# Patient Record
Sex: Male | Born: 1940 | Race: Black or African American | Hispanic: No | Marital: Single | Smoking: Current every day smoker
Health system: Southern US, Community
[De-identification: ages and names within clinical notes are randomized; demographics above are authoritative.]

## PROBLEM LIST (undated history)

## (undated) DIAGNOSIS — M47815 Spondylosis without myelopathy or radiculopathy, thoracolumbar region: Secondary | ICD-10-CM

## (undated) DIAGNOSIS — F1210 Cannabis abuse, uncomplicated: Secondary | ICD-10-CM

## (undated) DIAGNOSIS — T462X5A Adverse effect of other antidysrhythmic drugs, initial encounter: Secondary | ICD-10-CM

## (undated) DIAGNOSIS — I428 Other cardiomyopathies: Secondary | ICD-10-CM

## (undated) DIAGNOSIS — N400 Enlarged prostate without lower urinary tract symptoms: Secondary | ICD-10-CM

## (undated) DIAGNOSIS — D126 Benign neoplasm of colon, unspecified: Secondary | ICD-10-CM

## (undated) DIAGNOSIS — H269 Unspecified cataract: Secondary | ICD-10-CM

## (undated) DIAGNOSIS — I4891 Unspecified atrial fibrillation: Secondary | ICD-10-CM

## (undated) DIAGNOSIS — Z5189 Encounter for other specified aftercare: Secondary | ICD-10-CM

## (undated) DIAGNOSIS — S41131A Puncture wound without foreign body of right upper arm, initial encounter: Secondary | ICD-10-CM

## (undated) DIAGNOSIS — I10 Essential (primary) hypertension: Secondary | ICD-10-CM

## (undated) DIAGNOSIS — E538 Deficiency of other specified B group vitamins: Secondary | ICD-10-CM

## (undated) DIAGNOSIS — R011 Cardiac murmur, unspecified: Secondary | ICD-10-CM

## (undated) DIAGNOSIS — H18009 Unspecified corneal deposit, unspecified eye: Secondary | ICD-10-CM

## (undated) DIAGNOSIS — Z114 Encounter for screening for human immunodeficiency virus [HIV]: Secondary | ICD-10-CM

## (undated) DIAGNOSIS — Z720 Tobacco use: Secondary | ICD-10-CM

## (undated) DIAGNOSIS — D7389 Other diseases of spleen: Secondary | ICD-10-CM

## (undated) DIAGNOSIS — W3400XA Accidental discharge from unspecified firearms or gun, initial encounter: Secondary | ICD-10-CM

## (undated) DIAGNOSIS — E440 Moderate protein-calorie malnutrition: Secondary | ICD-10-CM

## (undated) DIAGNOSIS — E785 Hyperlipidemia, unspecified: Secondary | ICD-10-CM

## (undated) DIAGNOSIS — E0580 Other thyrotoxicosis without thyrotoxic crisis or storm: Secondary | ICD-10-CM

## (undated) DIAGNOSIS — R918 Other nonspecific abnormal finding of lung field: Secondary | ICD-10-CM

## (undated) DIAGNOSIS — K259 Gastric ulcer, unspecified as acute or chronic, without hemorrhage or perforation: Secondary | ICD-10-CM

## (undated) DIAGNOSIS — I739 Peripheral vascular disease, unspecified: Secondary | ICD-10-CM

## (undated) HISTORY — DX: Gastric ulcer, unspecified as acute or chronic, without hemorrhage or perforation: K25.9

## (undated) HISTORY — DX: Other thyrotoxicosis without thyrotoxic crisis or storm: E05.80

## (undated) HISTORY — DX: Essential (primary) hypertension: I10

## (undated) HISTORY — DX: Hyperlipidemia, unspecified: E78.5

## (undated) HISTORY — DX: Other cardiomyopathies: I42.8

## (undated) HISTORY — PX: CATARACT EXTRACTION: SUR2

## (undated) HISTORY — DX: Unspecified corneal deposit, unspecified eye: H18.009

## (undated) HISTORY — DX: Encounter for other specified aftercare: Z51.89

## (undated) HISTORY — DX: Spondylosis without myelopathy or radiculopathy, thoracolumbar region: M47.815

## (undated) HISTORY — DX: Benign prostatic hyperplasia without lower urinary tract symptoms: N40.0

## (undated) HISTORY — DX: Unspecified atrial fibrillation: I48.91

## (undated) HISTORY — DX: Unspecified cataract: H26.9

## (undated) HISTORY — DX: Deficiency of other specified B group vitamins: E53.8

## (undated) HISTORY — DX: Tobacco use: Z72.0

## (undated) HISTORY — DX: Other nonspecific abnormal finding of lung field: R91.8

## (undated) HISTORY — PX: OTHER SURGICAL HISTORY: SHX169

## (undated) HISTORY — DX: Cardiac murmur, unspecified: R01.1

## (undated) HISTORY — DX: Accidental discharge from unspecified firearms or gun, initial encounter: W34.00XA

## (undated) HISTORY — PX: EYE SURGERY: SHX253

## (undated) HISTORY — DX: Puncture wound without foreign body of right upper arm, initial encounter: S41.131A

## (undated) HISTORY — DX: Moderate protein-calorie malnutrition: E44.0

## (undated) HISTORY — DX: Benign neoplasm of colon, unspecified: D12.6

## (undated) HISTORY — DX: Adverse effect of other antidysrhythmic drugs, initial encounter: T46.2X5A

## (undated) HISTORY — DX: Other diseases of spleen: D73.89

## (undated) HISTORY — DX: Cannabis abuse, uncomplicated: F12.10

## (undated) HISTORY — DX: Peripheral vascular disease, unspecified: I73.9

---

## 1999-03-31 ENCOUNTER — Emergency Department (HOSPITAL_COMMUNITY): Admission: EM | Admit: 1999-03-31 | Discharge: 1999-03-31 | Payer: Self-pay | Admitting: Emergency Medicine

## 1999-03-31 ENCOUNTER — Encounter: Payer: Self-pay | Admitting: Emergency Medicine

## 1999-04-01 ENCOUNTER — Emergency Department (HOSPITAL_COMMUNITY): Admission: EM | Admit: 1999-04-01 | Discharge: 1999-04-01 | Payer: Self-pay | Admitting: Emergency Medicine

## 1999-04-02 ENCOUNTER — Encounter: Payer: Self-pay | Admitting: Emergency Medicine

## 2001-04-11 ENCOUNTER — Emergency Department (HOSPITAL_COMMUNITY): Admission: EM | Admit: 2001-04-11 | Discharge: 2001-04-11 | Payer: Self-pay | Admitting: Emergency Medicine

## 2001-04-11 ENCOUNTER — Encounter: Payer: Self-pay | Admitting: Emergency Medicine

## 2003-01-24 ENCOUNTER — Encounter: Payer: Self-pay | Admitting: General Practice

## 2003-01-24 ENCOUNTER — Encounter: Admission: RE | Admit: 2003-01-24 | Discharge: 2003-01-24 | Payer: Self-pay | Admitting: General Practice

## 2004-11-20 ENCOUNTER — Ambulatory Visit: Payer: Self-pay | Admitting: Family Medicine

## 2006-11-20 DIAGNOSIS — K259 Gastric ulcer, unspecified as acute or chronic, without hemorrhage or perforation: Secondary | ICD-10-CM | POA: Insufficient documentation

## 2006-11-20 HISTORY — DX: Gastric ulcer, unspecified as acute or chronic, without hemorrhage or perforation: K25.9

## 2006-11-25 ENCOUNTER — Ambulatory Visit: Payer: Self-pay | Admitting: Cardiology

## 2006-11-25 ENCOUNTER — Ambulatory Visit: Payer: Self-pay | Admitting: Hospitalist

## 2006-11-25 ENCOUNTER — Encounter (INDEPENDENT_AMBULATORY_CARE_PROVIDER_SITE_OTHER): Payer: Self-pay | Admitting: Internal Medicine

## 2006-11-25 ENCOUNTER — Inpatient Hospital Stay (HOSPITAL_COMMUNITY): Admission: EM | Admit: 2006-11-25 | Discharge: 2006-11-28 | Payer: Self-pay | Admitting: Emergency Medicine

## 2006-11-26 ENCOUNTER — Encounter (INDEPENDENT_AMBULATORY_CARE_PROVIDER_SITE_OTHER): Payer: Self-pay | Admitting: Specialist

## 2006-11-27 ENCOUNTER — Encounter: Payer: Self-pay | Admitting: Internal Medicine

## 2006-11-29 ENCOUNTER — Ambulatory Visit: Payer: Self-pay | Admitting: Internal Medicine

## 2006-11-30 ENCOUNTER — Ambulatory Visit: Payer: Self-pay | Admitting: Internal Medicine

## 2006-12-01 ENCOUNTER — Ambulatory Visit: Payer: Self-pay | Admitting: Internal Medicine

## 2006-12-02 ENCOUNTER — Ambulatory Visit: Payer: Self-pay | Admitting: Internal Medicine

## 2006-12-07 ENCOUNTER — Ambulatory Visit: Payer: Self-pay | Admitting: Internal Medicine

## 2006-12-07 ENCOUNTER — Ambulatory Visit (HOSPITAL_COMMUNITY): Admission: RE | Admit: 2006-12-07 | Discharge: 2006-12-07 | Payer: Self-pay | Admitting: Internal Medicine

## 2006-12-07 ENCOUNTER — Encounter (INDEPENDENT_AMBULATORY_CARE_PROVIDER_SITE_OTHER): Payer: Self-pay | Admitting: Ophthalmology

## 2006-12-07 LAB — CONVERTED CEMR LAB
HCT: 42.2 % (ref 41.0–49.0)
Hemoglobin: 14.3 g/dL (ref 13.9–16.8)
MCHC: 33.8 g/dL (ref 33.1–35.4)
MCV: 101.2 fL — ABNORMAL HIGH (ref 78.8–100.0)
Platelets: 362 10*3/uL (ref 152–374)
RBC: 4.17 M/uL — ABNORMAL LOW (ref 4.20–5.50)
RDW: 15.3 % (ref 11.5–15.3)
WBC: 7.8 10*3/uL (ref 3.7–10.0)

## 2006-12-08 DIAGNOSIS — I471 Supraventricular tachycardia: Secondary | ICD-10-CM | POA: Insufficient documentation

## 2006-12-08 DIAGNOSIS — K250 Acute gastric ulcer with hemorrhage: Secondary | ICD-10-CM | POA: Insufficient documentation

## 2006-12-08 DIAGNOSIS — Z720 Tobacco use: Secondary | ICD-10-CM | POA: Insufficient documentation

## 2006-12-08 DIAGNOSIS — R030 Elevated blood-pressure reading, without diagnosis of hypertension: Secondary | ICD-10-CM | POA: Insufficient documentation

## 2006-12-08 DIAGNOSIS — K279 Peptic ulcer, site unspecified, unspecified as acute or chronic, without hemorrhage or perforation: Secondary | ICD-10-CM | POA: Insufficient documentation

## 2006-12-08 DIAGNOSIS — M47815 Spondylosis without myelopathy or radiculopathy, thoracolumbar region: Secondary | ICD-10-CM

## 2006-12-08 HISTORY — DX: Tobacco use: Z72.0

## 2006-12-08 HISTORY — DX: Spondylosis without myelopathy or radiculopathy, thoracolumbar region: M47.815

## 2006-12-10 ENCOUNTER — Ambulatory Visit: Payer: Self-pay | Admitting: Cardiovascular Disease

## 2006-12-13 ENCOUNTER — Ambulatory Visit: Payer: Self-pay | Admitting: Internal Medicine

## 2006-12-20 ENCOUNTER — Ambulatory Visit: Payer: Self-pay | Admitting: Internal Medicine

## 2006-12-27 ENCOUNTER — Ambulatory Visit: Payer: Self-pay | Admitting: Internal Medicine

## 2006-12-27 ENCOUNTER — Ambulatory Visit: Payer: Self-pay | Admitting: Hospitalist

## 2006-12-30 ENCOUNTER — Ambulatory Visit: Payer: Self-pay

## 2007-01-19 ENCOUNTER — Ambulatory Visit: Payer: Self-pay | Admitting: Hospitalist

## 2007-01-24 ENCOUNTER — Ambulatory Visit: Payer: Self-pay | Admitting: Cardiovascular Disease

## 2007-02-14 ENCOUNTER — Ambulatory Visit: Payer: Self-pay | Admitting: Internal Medicine

## 2007-02-18 ENCOUNTER — Ambulatory Visit: Payer: Self-pay | Admitting: Internal Medicine

## 2007-03-18 ENCOUNTER — Ambulatory Visit: Payer: Self-pay | Admitting: Internal Medicine

## 2007-03-29 ENCOUNTER — Telehealth: Payer: Self-pay | Admitting: *Deleted

## 2007-04-18 ENCOUNTER — Encounter (INDEPENDENT_AMBULATORY_CARE_PROVIDER_SITE_OTHER): Payer: Self-pay | Admitting: *Deleted

## 2007-04-18 ENCOUNTER — Ambulatory Visit: Payer: Self-pay | Admitting: Hospitalist

## 2007-05-18 ENCOUNTER — Ambulatory Visit: Payer: Self-pay | Admitting: Internal Medicine

## 2007-05-25 ENCOUNTER — Ambulatory Visit: Payer: Self-pay | Admitting: Internal Medicine

## 2007-05-25 ENCOUNTER — Encounter (INDEPENDENT_AMBULATORY_CARE_PROVIDER_SITE_OTHER): Payer: Self-pay | Admitting: Internal Medicine

## 2007-05-25 DIAGNOSIS — E538 Deficiency of other specified B group vitamins: Secondary | ICD-10-CM | POA: Insufficient documentation

## 2007-05-25 DIAGNOSIS — I10 Essential (primary) hypertension: Secondary | ICD-10-CM | POA: Insufficient documentation

## 2007-05-25 HISTORY — DX: Deficiency of other specified B group vitamins: E53.8

## 2007-05-25 HISTORY — DX: Essential (primary) hypertension: I10

## 2007-05-25 LAB — CONVERTED CEMR LAB
HCT: 43.6 % (ref 39.0–52.0)
Hemoglobin: 14.5 g/dL (ref 13.0–17.0)
MCHC: 33.3 g/dL (ref 30.0–36.0)
MCV: 98 fL (ref 78.0–100.0)
Platelets: 265 10*3/uL (ref 150–400)
RBC: 4.45 M/uL (ref 4.22–5.81)
RDW: 13.4 % (ref 11.5–14.0)
WBC: 6.6 10*3/uL (ref 4.0–10.5)

## 2007-05-27 ENCOUNTER — Ambulatory Visit (HOSPITAL_COMMUNITY): Admission: RE | Admit: 2007-05-27 | Discharge: 2007-05-27 | Payer: Self-pay | Admitting: Internal Medicine

## 2007-05-31 ENCOUNTER — Telehealth (INDEPENDENT_AMBULATORY_CARE_PROVIDER_SITE_OTHER): Payer: Self-pay | Admitting: Internal Medicine

## 2007-06-09 ENCOUNTER — Ambulatory Visit: Payer: Self-pay | Admitting: Internal Medicine

## 2007-06-09 ENCOUNTER — Encounter (INDEPENDENT_AMBULATORY_CARE_PROVIDER_SITE_OTHER): Payer: Self-pay | Admitting: Internal Medicine

## 2007-06-11 LAB — CONVERTED CEMR LAB
Cholesterol: 164 mg/dL (ref 0–200)
HDL: 39 mg/dL — ABNORMAL LOW (ref >39)
LDL Cholesterol: 108 mg/dL — ABNORMAL HIGH (ref 0–99)
Total CHOL/HDL Ratio: 4.2
Triglycerides: 84 mg/dL (ref <150)
VLDL: 17 mg/dL (ref 0–40)

## 2007-06-21 ENCOUNTER — Ambulatory Visit: Payer: Self-pay | Admitting: Internal Medicine

## 2007-06-21 ENCOUNTER — Encounter (INDEPENDENT_AMBULATORY_CARE_PROVIDER_SITE_OTHER): Payer: Self-pay | Admitting: Internal Medicine

## 2007-06-21 DIAGNOSIS — E785 Hyperlipidemia, unspecified: Secondary | ICD-10-CM | POA: Insufficient documentation

## 2007-06-21 HISTORY — DX: Hyperlipidemia, unspecified: E78.5

## 2007-06-22 LAB — CONVERTED CEMR LAB
ALT: 17 units/L (ref 0–53)
AST: 20 units/L (ref 0–37)
Albumin: 4.6 g/dL (ref 3.5–5.2)
Alkaline Phosphatase: 84 units/L (ref 39–117)
BUN: 11 mg/dL (ref 6–23)
CO2: 27 meq/L (ref 19–32)
Calcium: 10.4 mg/dL (ref 8.4–10.5)
Chloride: 105 meq/L (ref 96–112)
Creatinine, Ser: 0.92 mg/dL (ref 0.40–1.50)
Glucose, Bld: 78 mg/dL (ref 70–99)
Potassium: 4.6 meq/L (ref 3.5–5.3)
Sodium: 140 meq/L (ref 135–145)
Total Bilirubin: 0.4 mg/dL (ref 0.3–1.2)
Total Protein: 7.2 g/dL (ref 6.0–8.3)
Vitamin B-12: 443 pg/mL (ref 211–911)

## 2007-07-12 ENCOUNTER — Ambulatory Visit: Payer: Self-pay | Admitting: Internal Medicine

## 2007-07-12 ENCOUNTER — Encounter (INDEPENDENT_AMBULATORY_CARE_PROVIDER_SITE_OTHER): Payer: Self-pay | Admitting: Internal Medicine

## 2007-07-12 LAB — CONVERTED CEMR LAB
BUN: 3 mg/dL — ABNORMAL LOW (ref 6–23)
CO2: 30 meq/L (ref 19–32)
Calcium: 10 mg/dL (ref 8.4–10.5)
Chloride: 110 meq/L (ref 96–112)
Creatinine, Ser: 1.04 mg/dL (ref 0.40–1.50)
Glucose, Bld: 95 mg/dL (ref 70–99)
HCT: 40.8 % (ref 39.0–52.0)
Hemoglobin: 13.6 g/dL (ref 13.0–17.0)
MCHC: 33.3 g/dL (ref 30.0–36.0)
MCV: 98.3 fL (ref 78.0–100.0)
Platelets: 241 10*3/uL (ref 150–400)
Potassium: 4.5 meq/L (ref 3.5–5.3)
RBC: 4.15 M/uL — ABNORMAL LOW (ref 4.22–5.81)
RDW: 13 % (ref 11.5–14.0)
Sodium: 143 meq/L (ref 135–145)
WBC: 8 10*3/uL (ref 4.0–10.5)

## 2007-08-10 ENCOUNTER — Ambulatory Visit: Payer: Self-pay | Admitting: Internal Medicine

## 2007-08-10 ENCOUNTER — Encounter (INDEPENDENT_AMBULATORY_CARE_PROVIDER_SITE_OTHER): Payer: Self-pay | Admitting: Internal Medicine

## 2007-08-13 LAB — CONVERTED CEMR LAB
BUN: 7 mg/dL (ref 6–23)
CO2: 26 meq/L (ref 19–32)
Calcium: 9.9 mg/dL (ref 8.4–10.5)
Chloride: 109 meq/L (ref 96–112)
Creatinine, Ser: 0.92 mg/dL (ref 0.40–1.50)
Creatinine, Urine: 154.7 mg/dL
Glucose, Bld: 86 mg/dL (ref 70–99)
Microalb Creat Ratio: 2.4 mg/g (ref 0.0–30.0)
Microalb, Ur: 0.37 mg/dL (ref 0.00–1.89)
Potassium: 4.9 meq/L (ref 3.5–5.3)
Sodium: 144 meq/L (ref 135–145)

## 2008-04-09 ENCOUNTER — Telehealth (INDEPENDENT_AMBULATORY_CARE_PROVIDER_SITE_OTHER): Payer: Self-pay | Admitting: Internal Medicine

## 2008-11-07 ENCOUNTER — Telehealth (INDEPENDENT_AMBULATORY_CARE_PROVIDER_SITE_OTHER): Payer: Self-pay | Admitting: Internal Medicine

## 2009-08-13 ENCOUNTER — Ambulatory Visit (HOSPITAL_COMMUNITY): Admission: RE | Admit: 2009-08-13 | Discharge: 2009-08-13 | Payer: Self-pay | Admitting: Internal Medicine

## 2009-08-13 ENCOUNTER — Encounter (INDEPENDENT_AMBULATORY_CARE_PROVIDER_SITE_OTHER): Payer: Self-pay | Admitting: Internal Medicine

## 2009-08-13 ENCOUNTER — Ambulatory Visit: Payer: Self-pay | Admitting: Internal Medicine

## 2009-08-13 LAB — CONVERTED CEMR LAB
BUN: 8 mg/dL (ref 6–23)
CO2: 20 meq/L (ref 19–32)
Calcium: 9.9 mg/dL (ref 8.4–10.5)
Chloride: 108 meq/L (ref 96–112)
Creatinine, Ser: 0.96 mg/dL (ref 0.40–1.50)
Glucose, Bld: 91 mg/dL (ref 70–99)
Potassium: 4.1 meq/L (ref 3.5–5.3)
Sodium: 142 meq/L (ref 135–145)

## 2009-08-21 ENCOUNTER — Ambulatory Visit (HOSPITAL_COMMUNITY): Admission: RE | Admit: 2009-08-21 | Discharge: 2009-08-21 | Payer: Self-pay | Admitting: Internal Medicine

## 2009-08-21 ENCOUNTER — Encounter (INDEPENDENT_AMBULATORY_CARE_PROVIDER_SITE_OTHER): Payer: Self-pay | Admitting: Internal Medicine

## 2009-08-21 ENCOUNTER — Ambulatory Visit: Payer: Self-pay | Admitting: Vascular Surgery

## 2009-09-02 ENCOUNTER — Ambulatory Visit: Payer: Self-pay | Admitting: Infectious Diseases

## 2009-09-02 ENCOUNTER — Encounter (INDEPENDENT_AMBULATORY_CARE_PROVIDER_SITE_OTHER): Payer: Self-pay | Admitting: Internal Medicine

## 2009-09-02 LAB — CONVERTED CEMR LAB
Cholesterol: 134 mg/dL (ref 0–200)
HDL: 45 mg/dL (ref >39)
LDL Cholesterol: 76 mg/dL (ref 0–99)
Total CHOL/HDL Ratio: 3
Triglycerides: 63 mg/dL (ref <150)
VLDL: 13 mg/dL (ref 0–40)

## 2009-09-03 ENCOUNTER — Encounter (INDEPENDENT_AMBULATORY_CARE_PROVIDER_SITE_OTHER): Payer: Self-pay | Admitting: Internal Medicine

## 2009-09-03 ENCOUNTER — Ambulatory Visit: Admission: RE | Admit: 2009-09-03 | Discharge: 2009-09-03 | Payer: Self-pay | Admitting: Internal Medicine

## 2009-09-06 ENCOUNTER — Ambulatory Visit (HOSPITAL_COMMUNITY): Admission: RE | Admit: 2009-09-06 | Discharge: 2009-09-06 | Payer: Self-pay | Admitting: Infectious Diseases

## 2009-09-16 ENCOUNTER — Ambulatory Visit: Payer: Self-pay | Admitting: Infectious Diseases

## 2009-09-18 ENCOUNTER — Encounter (INDEPENDENT_AMBULATORY_CARE_PROVIDER_SITE_OTHER): Payer: Self-pay | Admitting: Internal Medicine

## 2009-09-24 ENCOUNTER — Ambulatory Visit: Payer: Self-pay | Admitting: Internal Medicine

## 2009-09-24 LAB — CONVERTED CEMR LAB
OCCULT 1: NEGATIVE
OCCULT 2: NEGATIVE
OCCULT 3: NEGATIVE

## 2009-09-25 ENCOUNTER — Encounter (INDEPENDENT_AMBULATORY_CARE_PROVIDER_SITE_OTHER): Payer: Self-pay | Admitting: Internal Medicine

## 2009-09-25 ENCOUNTER — Ambulatory Visit: Payer: Self-pay | Admitting: Vascular Surgery

## 2009-10-11 ENCOUNTER — Ambulatory Visit: Payer: Self-pay | Admitting: Internal Medicine

## 2009-10-17 ENCOUNTER — Ambulatory Visit: Payer: Self-pay | Admitting: Internal Medicine

## 2009-10-28 ENCOUNTER — Ambulatory Visit: Payer: Self-pay | Admitting: Internal Medicine

## 2009-10-28 ENCOUNTER — Encounter: Payer: Self-pay | Admitting: Internal Medicine

## 2009-10-28 LAB — HM COLONOSCOPY

## 2009-10-29 ENCOUNTER — Encounter: Payer: Self-pay | Admitting: Internal Medicine

## 2009-11-26 ENCOUNTER — Telehealth (INDEPENDENT_AMBULATORY_CARE_PROVIDER_SITE_OTHER): Payer: Self-pay | Admitting: *Deleted

## 2010-02-11 ENCOUNTER — Telehealth: Payer: Self-pay | Admitting: Internal Medicine

## 2010-05-26 ENCOUNTER — Ambulatory Visit: Payer: Self-pay | Admitting: Cardiovascular Disease

## 2010-05-26 ENCOUNTER — Ambulatory Visit: Payer: Self-pay | Admitting: Infectious Diseases

## 2010-05-26 ENCOUNTER — Inpatient Hospital Stay (HOSPITAL_COMMUNITY): Admission: EM | Admit: 2010-05-26 | Discharge: 2010-06-05 | Payer: Self-pay | Admitting: Emergency Medicine

## 2010-05-26 ENCOUNTER — Encounter: Payer: Self-pay | Admitting: Internal Medicine

## 2010-05-27 ENCOUNTER — Encounter: Payer: Self-pay | Admitting: Infectious Diseases

## 2010-05-29 ENCOUNTER — Ambulatory Visit: Payer: Self-pay | Admitting: Gastroenterology

## 2010-05-30 ENCOUNTER — Encounter: Payer: Self-pay | Admitting: Gastroenterology

## 2010-05-30 ENCOUNTER — Encounter: Payer: Self-pay | Admitting: Infectious Diseases

## 2010-05-30 ENCOUNTER — Encounter: Payer: Self-pay | Admitting: Internal Medicine

## 2010-06-03 ENCOUNTER — Encounter: Payer: Self-pay | Admitting: Internal Medicine

## 2010-06-05 ENCOUNTER — Encounter: Payer: Self-pay | Admitting: Internal Medicine

## 2010-06-05 DIAGNOSIS — I4891 Unspecified atrial fibrillation: Secondary | ICD-10-CM

## 2010-06-05 DIAGNOSIS — K2970 Gastritis, unspecified, without bleeding: Secondary | ICD-10-CM | POA: Insufficient documentation

## 2010-06-05 DIAGNOSIS — D7389 Other diseases of spleen: Secondary | ICD-10-CM | POA: Insufficient documentation

## 2010-06-05 DIAGNOSIS — K2990 Gastroduodenitis, unspecified, without bleeding: Secondary | ICD-10-CM

## 2010-06-05 HISTORY — DX: Unspecified atrial fibrillation: I48.91

## 2010-06-06 ENCOUNTER — Encounter: Payer: Self-pay | Admitting: Gastroenterology

## 2010-06-09 ENCOUNTER — Ambulatory Visit: Payer: Self-pay | Admitting: Cardiology

## 2010-06-09 ENCOUNTER — Telehealth (INDEPENDENT_AMBULATORY_CARE_PROVIDER_SITE_OTHER): Payer: Self-pay | Admitting: Pharmacist

## 2010-06-09 LAB — CONVERTED CEMR LAB: POC INR: 4

## 2010-06-16 ENCOUNTER — Ambulatory Visit: Payer: Self-pay | Admitting: Cardiology

## 2010-06-16 LAB — CONVERTED CEMR LAB
ALT: 100 units/L — ABNORMAL HIGH (ref 0–53)
AST: 49 units/L — ABNORMAL HIGH (ref 0–37)
Albumin: 4 g/dL (ref 3.5–5.2)
Alkaline Phosphatase: 103 units/L (ref 39–117)
BUN: 16 mg/dL (ref 6–23)
Bilirubin, Direct: 0.2 mg/dL (ref 0.0–0.3)
CO2: 28 meq/L (ref 19–32)
Calcium: 10.1 mg/dL (ref 8.4–10.5)
Chloride: 103 meq/L (ref 96–112)
Creatinine, Ser: 1 mg/dL (ref 0.4–1.5)
GFR calc non Af Amer: 94.23 mL/min (ref >60)
Glucose, Bld: 68 mg/dL — ABNORMAL LOW (ref 70–99)
Potassium: 4.3 meq/L (ref 3.5–5.1)
Sodium: 138 meq/L (ref 135–145)
Total Bilirubin: 0.6 mg/dL (ref 0.3–1.2)
Total Protein: 7.1 g/dL (ref 6.0–8.3)

## 2010-06-20 DIAGNOSIS — I5040 Unspecified combined systolic (congestive) and diastolic (congestive) heart failure: Secondary | ICD-10-CM | POA: Insufficient documentation

## 2010-06-20 DIAGNOSIS — A0472 Enterocolitis due to Clostridium difficile, not specified as recurrent: Secondary | ICD-10-CM | POA: Insufficient documentation

## 2010-06-24 ENCOUNTER — Ambulatory Visit: Payer: Self-pay | Admitting: Cardiology

## 2010-06-25 ENCOUNTER — Ambulatory Visit: Payer: Self-pay | Admitting: Internal Medicine

## 2010-06-25 LAB — CONVERTED CEMR LAB

## 2010-07-03 LAB — CONVERTED CEMR LAB
Basophils Absolute: 0.1 10*3/uL (ref 0.0–0.1)
Basophils Relative: 0.8 % (ref 0.0–3.0)
Eosinophils Absolute: 0.1 10*3/uL (ref 0.0–0.7)
Eosinophils Relative: 1.3 % (ref 0.0–5.0)
HCT: 46.2 % (ref 39.0–52.0)
Hemoglobin: 15.7 g/dL (ref 13.0–17.0)
Lymphocytes Relative: 21.8 % (ref 12.0–46.0)
Lymphs Abs: 1.4 10*3/uL (ref 0.7–4.0)
MCHC: 33.9 g/dL (ref 30.0–36.0)
MCV: 101.8 fL — ABNORMAL HIGH (ref 78.0–100.0)
Monocytes Absolute: 0.4 10*3/uL (ref 0.1–1.0)
Monocytes Relative: 6.8 % (ref 3.0–12.0)
Neutro Abs: 4.6 10*3/uL (ref 1.4–7.7)
Neutrophils Relative %: 69.3 % (ref 43.0–77.0)
Platelets: 325 10*3/uL (ref 150.0–400.0)
RBC: 4.54 M/uL (ref 4.22–5.81)
RDW: 13.7 % (ref 11.5–14.6)
WBC: 6.6 10*3/uL (ref 4.5–10.5)

## 2010-07-09 ENCOUNTER — Telehealth: Payer: Self-pay | Admitting: Cardiology

## 2010-07-10 ENCOUNTER — Ambulatory Visit: Payer: Self-pay

## 2010-07-10 ENCOUNTER — Ambulatory Visit: Payer: Self-pay | Admitting: Cardiology

## 2010-07-16 ENCOUNTER — Ambulatory Visit: Payer: Self-pay | Admitting: Cardiology

## 2010-07-17 ENCOUNTER — Ambulatory Visit: Payer: Self-pay | Admitting: Cardiovascular Disease

## 2010-07-17 ENCOUNTER — Ambulatory Visit: Payer: Self-pay | Admitting: Cardiology

## 2010-07-17 ENCOUNTER — Encounter: Payer: Self-pay | Admitting: Ophthalmology

## 2010-07-17 ENCOUNTER — Inpatient Hospital Stay (HOSPITAL_COMMUNITY): Admission: EM | Admit: 2010-07-17 | Discharge: 2010-07-19 | Payer: Self-pay | Admitting: Emergency Medicine

## 2010-07-17 ENCOUNTER — Ambulatory Visit: Payer: Self-pay | Admitting: Internal Medicine

## 2010-07-18 ENCOUNTER — Encounter: Payer: Self-pay | Admitting: Internal Medicine

## 2010-07-19 ENCOUNTER — Encounter: Payer: Self-pay | Admitting: Internal Medicine

## 2010-07-19 DIAGNOSIS — E871 Hypo-osmolality and hyponatremia: Secondary | ICD-10-CM | POA: Insufficient documentation

## 2010-08-04 ENCOUNTER — Ambulatory Visit: Payer: Self-pay | Admitting: Cardiology

## 2010-08-14 ENCOUNTER — Ambulatory Visit: Payer: Self-pay | Admitting: Internal Medicine

## 2010-08-14 ENCOUNTER — Telehealth: Payer: Self-pay | Admitting: Cardiology

## 2010-08-14 DIAGNOSIS — R933 Abnormal findings on diagnostic imaging of other parts of digestive tract: Secondary | ICD-10-CM | POA: Insufficient documentation

## 2010-08-19 ENCOUNTER — Encounter (INDEPENDENT_AMBULATORY_CARE_PROVIDER_SITE_OTHER): Payer: Self-pay | Admitting: *Deleted

## 2010-08-29 ENCOUNTER — Emergency Department (HOSPITAL_COMMUNITY): Admission: EM | Admit: 2010-08-29 | Discharge: 2010-08-29 | Payer: Self-pay | Admitting: Emergency Medicine

## 2010-09-04 ENCOUNTER — Ambulatory Visit: Payer: Self-pay

## 2010-09-04 ENCOUNTER — Encounter: Payer: Self-pay | Admitting: Cardiology

## 2010-10-17 ENCOUNTER — Encounter (INDEPENDENT_AMBULATORY_CARE_PROVIDER_SITE_OTHER): Payer: Self-pay | Admitting: *Deleted

## 2010-10-17 ENCOUNTER — Encounter: Payer: Self-pay | Admitting: Cardiology

## 2010-10-17 ENCOUNTER — Ambulatory Visit: Payer: Self-pay | Admitting: Gastroenterology

## 2010-10-30 ENCOUNTER — Ambulatory Visit: Payer: Self-pay | Admitting: Gastroenterology

## 2010-10-30 ENCOUNTER — Ambulatory Visit (HOSPITAL_COMMUNITY): Admission: RE | Admit: 2010-10-30 | Discharge: 2010-10-30 | Payer: Self-pay | Admitting: Gastroenterology

## 2010-11-03 ENCOUNTER — Ambulatory Visit: Payer: Self-pay | Admitting: Gastroenterology

## 2010-11-03 LAB — CONVERTED CEMR LAB
BUN: 9 mg/dL (ref 6–23)
Creatinine, Ser: 1.2 mg/dL (ref 0.4–1.5)

## 2010-11-04 ENCOUNTER — Telehealth: Payer: Self-pay | Admitting: Gastroenterology

## 2010-11-05 ENCOUNTER — Ambulatory Visit: Payer: Self-pay | Admitting: Cardiology

## 2010-11-06 ENCOUNTER — Encounter: Payer: Self-pay | Admitting: Internal Medicine

## 2010-11-06 DIAGNOSIS — R911 Solitary pulmonary nodule: Secondary | ICD-10-CM | POA: Insufficient documentation

## 2010-11-06 DIAGNOSIS — R918 Other nonspecific abnormal finding of lung field: Secondary | ICD-10-CM

## 2010-11-10 ENCOUNTER — Encounter (INDEPENDENT_AMBULATORY_CARE_PROVIDER_SITE_OTHER): Payer: Self-pay | Admitting: *Deleted

## 2010-11-10 ENCOUNTER — Telehealth: Payer: Self-pay | Admitting: Cardiology

## 2010-11-10 ENCOUNTER — Telehealth: Payer: Self-pay | Admitting: Gastroenterology

## 2010-12-05 ENCOUNTER — Ambulatory Visit: Payer: Self-pay | Admitting: Cardiology

## 2010-12-05 ENCOUNTER — Encounter: Payer: Self-pay | Admitting: Cardiology

## 2010-12-17 ENCOUNTER — Ambulatory Visit (HOSPITAL_COMMUNITY)
Admission: RE | Admit: 2010-12-17 | Discharge: 2010-12-17 | Payer: Self-pay | Source: Home / Self Care | Attending: Internal Medicine | Admitting: Internal Medicine

## 2010-12-17 ENCOUNTER — Ambulatory Visit: Payer: Self-pay | Admitting: Cardiology

## 2011-01-08 ENCOUNTER — Ambulatory Visit: Admission: RE | Admit: 2011-01-08 | Discharge: 2011-01-08 | Payer: Self-pay | Source: Home / Self Care

## 2011-01-08 LAB — CONVERTED CEMR LAB
ALT: 21 units/L (ref 0–53)
AST: 25 units/L (ref 0–37)
Albumin: 3.9 g/dL (ref 3.5–5.2)
Alkaline Phosphatase: 67 units/L (ref 39–117)
BUN: 5 mg/dL — ABNORMAL LOW (ref 6–23)
Basophils Absolute: 0 10*3/uL (ref 0.0–0.1)
Basophils Relative: 0 % (ref 0–1)
CO2: 28 meq/L (ref 19–32)
Calcium: 10 mg/dL (ref 8.4–10.5)
Chloride: 104 meq/L (ref 96–112)
Creatinine, Ser: 1.18 mg/dL (ref 0.40–1.50)
Eosinophils Absolute: 0.1 10*3/uL (ref 0.0–0.7)
Eosinophils Relative: 1 % (ref 0–5)
Glucose, Bld: 98 mg/dL (ref 70–99)
HCT: 43.7 % (ref 39.0–52.0)
Hemoglobin: 14.1 g/dL (ref 13.0–17.0)
Lymphocytes Relative: 23 % (ref 12–46)
Lymphs Abs: 1.8 10*3/uL (ref 0.7–4.0)
MCHC: 32.3 g/dL (ref 30.0–36.0)
MCV: 100.5 fL — ABNORMAL HIGH (ref 78.0–100.0)
Monocytes Absolute: 0.7 10*3/uL (ref 0.1–1.0)
Monocytes Relative: 9 % (ref 3–12)
Neutro Abs: 5.5 10*3/uL (ref 1.7–7.7)
Neutrophils Relative %: 67 % (ref 43–77)
Platelets: 209 10*3/uL (ref 150–400)
Potassium: 4 meq/L (ref 3.5–5.3)
RBC: 4.35 M/uL (ref 4.22–5.81)
RDW: 12.8 % (ref 11.5–15.5)
Sodium: 141 meq/L (ref 135–145)
TSH: 1.181 microintl units/mL (ref 0.350–4.50)
Total Bilirubin: 0.4 mg/dL (ref 0.3–1.2)
Total Protein: 6.5 g/dL (ref 6.0–8.3)
WBC: 8.1 10*3/uL (ref 4.0–10.5)

## 2011-01-11 ENCOUNTER — Encounter: Payer: Self-pay | Admitting: Cardiology

## 2011-01-14 ENCOUNTER — Encounter: Payer: Self-pay | Admitting: Cardiovascular Disease

## 2011-01-18 LAB — CONVERTED CEMR LAB
ALT: 55 U/L — ABNORMAL HIGH
AST: 35 U/L
Albumin: 3.6 g/dL
Alkaline Phosphatase: 87 U/L
BUN: 21 mg/dL
BUN: 26 mg/dL — ABNORMAL HIGH
BUN: 9 mg/dL
Bilirubin, Direct: 0 mg/dL
CO2: 25 meq/L
CO2: 28 meq/L
CO2: 28 meq/L
Calcium: 10.4 mg/dL
Calcium: 9.3 mg/dL
Calcium: 9.8 mg/dL
Chloride: 82 meq/L — ABNORMAL LOW
Chloride: 97 meq/L
Chloride: 99 meq/L
Cholesterol: 151 mg/dL
Creatinine, Ser: 1 mg/dL
Creatinine, Ser: 1.4 mg/dL
Creatinine, Ser: 1.5 mg/dL
GFR calc non Af Amer: 101.08 mL/min
GFR calc non Af Amer: 61.57 mL/min
GFR calc non Af Amer: 62.56 mL/min
Glucose, Bld: 68 mg/dL — ABNORMAL LOW
Glucose, Bld: 91 mg/dL
Glucose, Bld: 97 mg/dL
HDL: 62.3 mg/dL
LDL Cholesterol: 73 mg/dL
Potassium: 4.5 meq/L
Potassium: 4.6 meq/L
Potassium: 5.8 meq/L — ABNORMAL HIGH
Sed Rate: 36 mm/h — ABNORMAL HIGH
Sodium: 119 meq/L — CL
Sodium: 130 meq/L — ABNORMAL LOW
Sodium: 137 meq/L
TSH: 0.7 u[IU]/mL
TSH: 2.39 u[IU]/mL
Total Bilirubin: 0.2 mg/dL — ABNORMAL LOW
Total CHOL/HDL Ratio: 2
Total Protein: 6.5 g/dL
Triglycerides: 77 mg/dL
VLDL: 15.4 mg/dL

## 2011-01-22 NOTE — Miscellaneous (Signed)
Summary: Problem List Update: Pulmonary Nodule  Clinical Lists Changes  Problems: Added new problem of PULMONARY NODULE, LEFT LOWER LOBE (ICD-518.89) - 7mm LLL nodule discovered on CT A/P on 11/05/10. Assessed PULMONARY NODULE, LEFT LOWER LOBE as comment only -  A 7mm pulmonary nodule was discovered on CT w/ contrast of the abdomen obtained for further evaluation of a splenic cyst observed on abdominal ultrasound.  Pt has known tobacco use, wiill ensure pt receives f/u imaging with CT chest  in 3-6 months.  Future Orders: CT with Contrast (CT w/ contrast) ... 01/05/2011  Orders: Added new Test order of CT with Contrast (CT w/ contrast) - Signed      Impression & Recommendations:  Problem # 1:  PULMONARY NODULE, LEFT LOWER LOBE (ICD-518.89)  A 7mm pulmonary nodule was discovered on CT w/ contrast of the abdomen obtained for further evaluation of a splenic cyst observed on abdominal ultrasound.  Pt has known tobacco use, wiill ensure pt receives f/u imaging with CT chest  in 3-6 months.  Future Orders: CT with Contrast (CT w/ contrast) ... 01/05/2011  Complete Medication List: 1)  Simvastatin 20 Mg Tabs (Simvastatin) .... Take 1 tablet by mouth once a day 2)  Pantoprazole Sodium 40 Mg Tbec (Pantoprazole sodium) .... Please take 40mg  (1 tab) by mouth twice daily. 3)  Amiodarone Hcl 200 Mg Tabs (Amiodarone hcl) .... One daily 4)  Metoprolol Succinate 50 Mg Xr24h-tab (Metoprolol succinate) .... Take one and one-half tablets (75mg ) by mouth once a day. 5)  Pradaxa 150 Mg Caps (Dabigatran etexilate mesylate) .... Take 1 capsule two times a day 6)  Enalapril Maleate 10 Mg Tabs (Enalapril maleate) .... One half tablet twice a day

## 2011-01-22 NOTE — Assessment & Plan Note (Signed)
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 BROUGHT MED FROM HOME    Patient tolerated injection without complications    Given by: Geannie Risen RN (January 19, 2007 11:06 AM)  Orders Added: 1)  Admin of Therapeutic Inj  intramuscular or subcutaneous [90772]

## 2011-01-22 NOTE — Letter (Signed)
Summary: Results Letter  [facility]  [address] [place], [place] [address]   Phone: [phone]  Fax: [phone]        June 06, 2010 MRN: [id number]    Daniel [name] [address] [place], [place]  [address]    Dear [name],   The biopsies taken during your recent EGD and flexible sigmoidoscopy showed no clear sign of infection and certainly no signs of cancer.  You should continue to follow the recommnedations that we discussed at the time of your procedure.  I believe you are scheduled for follow up appt with Dr. Leone Payor in 3-4 weeks.  Please feel free to call if you have any further questions or concerns.       Sincerely,  Rachael Fee MD  This letter has been electronically signed by your physician.  Appended Document: Results Letter letter mailed
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
## 2011-01-22 NOTE — Progress Notes (Signed)
Summary: refill/gg  Phone Note Refill Request  on February 11, 2010 2:17 PM  Refills Requested: Medication #1:  SIMVASTATIN 20 MG  TABS Take 1 tablet by mouth once a day   Dosage confirmed as above?Dosage Confirmed   Supply Requested: 1 year   Last Refilled: 09/02/2009 last lipids   09/02/2009      # 90 requested   Method Requested: Electronic Initial call taken by: Merrie Roof RN,  February 11, 2010 2:17 PM  Follow-up for Phone Call        Please schedule Mr. Mcsweeney with Dr. Tobie Lords at the next available non-overbook appointment within three months.  Thanks. Follow-up by: Doneen Poisson MD,  February 11, 2010 2:26 PM  Additional Follow-up for Phone Call Additional follow up Details #1::        flag sent to [place] for appointment to be scheduled Additional Follow-up by: Merrie Roof RN,  February 12, 2010 3:17 PM    Prescriptions: SIMVASTATIN 20 MG  TABS (SIMVASTATIN) Take 1 tablet by mouth once a day  #90 x 3   Entered and Authorized by:   Doneen Poisson MD   Signed by:   Doneen Poisson MD on 02/11/2010   Method used:   Electronically to        [facility] [phone]* (retail)       [address]       [place], [place]  [address]       Ph: [phone]       Fax: [phone]   RxID:   [id number]

## 2011-01-22 NOTE — Assessment & Plan Note (Signed)
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 Call to make an appt before then if you have any problems.
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
## 2011-01-22 NOTE — Progress Notes (Signed)
Summary: Test Tomorrow  Phone Note Call from Patient Call back at Home Phone [phone]   Caller: Patient Call For: Dr. Christella Hartigan Reason for Call: Talk to Nurse Details for Reason: Test Tomorrow Summary of Call: Pt. says he has a test tomorrow and wanted to know if he was supposed to do anything to prepare for it. Please call and advise. Initial call taken by: Schuyler Amor,  November 04, 2010 11:08 AM  Follow-up for Phone Call        pt advised he was to come here and pick up his contrast the day of his labs but he was confused,  he will come by today and pick up contrast and instructions Follow-up by: Chales Abrahams CMA Duncan Dull),  November 04, 2010 11:20 AM

## 2011-01-22 NOTE — Assessment & Plan Note (Signed)
Summary: FU/SB.   Vital Signs:  Patient profile:   70 year old male Height:      70 inches (177.80 cm) Weight:      117.3 pounds (53.32 kg) BMI:     16.89 Temp:     95.9 degrees F (35.50 degrees C) oral Pulse rate:   57 / minute BP sitting:   140 / 87  (right arm)  Vitals Entered By: Chinita Pester RN (September 02, 2009 9:03 AM) CC: F/U visit - no further c/o leg pain.  States he has abd. gas/he stopped taking Prevacid. Is Patient Diabetic? No Pain Assessment Patient in pain? no      Nutritional Status BMI of < 19 = underweight  Have you ever been in a relationship where you felt threatened, hurt or afraid?No   Does patient need assistance? Functional Status Self care Ambulation Normal   Primary Care Tequita Marrs:  Silvestre Gunner MD  CC:  F/U visit - no further c/o leg pain.  States he has abd. gas/he stopped taking Prevacid.Marland Kitchen  History of Present Illness: Mr. Mogan is a 70 yo M with PVD confirmed by ABI who presents for f/u of his claudication for which he was last seen 2 weeks ago by Dr. Polly Cobia. He was prescribed aspirin at that time but has not been taking it because he thought it was for pain. He has been otherwise well with no claudication episodes in the interim.  He also has stopped taking his Prevacid 3-4 mos ago because he saw on the news that it could potentially be harmful. He has a h/o bleeding ulcer (12/07) and wanted to mention that when he doesn't eat, his stomach rumbles. Denies pain, dizziness, feeling faint, CP, SOB, n/v, or changes in stool color or consistency. He'd like to know if he should go back on the Prevacid.  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 GI:  Denies bloody stools, dark tarry stools, nausea, and vomiting.  Physical Exam  General:  alert, well-developed, and underweight appearing.   Head:  normocephalic and atraumatic.   Lungs:  normal respiratory effort, no intercostal retractions, no accessory muscle use, no crackles, and no wheezes.   Heart:  normal rate, no murmur, no gallop, no rub, and irregular rhythm.   Abdomen:  soft, non-tender, and no distention.   Pulses:  R dorsalis pedis decreased and L dorsalis pedis decreased.   Extremities:  Absent leg hair bilat upper-shin and below. No ulcers or wounds on feet Neurologic:  alert & oriented X3.     Impression & Recommendations:  Problem # 1:  INTERMITTENT CLAUDICATION, RIGHT LEG (ICD-443.9) Pt will see Vascular on Oct 16. Dr. Sampson Goon counseled the patient on the importance of taking the aspirin as prescribed and explained that it's not for pain but rather to thin the blood and help with blood flow to his legs. Mr. Jocson agreed to take aspirin daily.  Problem # 2:  IRREGULAR HEART RATE (ICD-427.9)  Mr. Robarts was noted to have an irregular heartbeat on exam, no tachycardia. He is asymptomatic (no palpitations, CP, or SOB), though he does describe some DOE which has been going on for a while. EKG today showed atrial flutter with poor R wave progression. Will order a 24 hr holter monitor and prescribe full-dose aspirin, which according to up-to-date is a sufficient anticoagulant in this patient with only 1 risk factor on the CHADS-2 score. Will have him f/u with me in a couple weeks.  His updated medication list for this problem includes:    Metoprolol Tartrate 50 Mg Tabs (Metoprolol tartrate) .Marland Kitchen... Take one tablet by mouth twice a day    Aspirin 325 Mg Tabs (Aspirin)  .Marland Kitchen... 1 tab daily  Orders: 12 Lead EKG (12 Lead EKG) 24 Hr Holter (24 Hr Holter)  Problem # 3:  TOBACCO ABUSE (ICD-305.1) Dr. Sampson Goon and I counseled Mr. Chohan on the importance of smoking cessation, especially given his PVD and other issues. Mr. Mulvehill said he would think over how he we can help him stop smoking, and I will see him back in clinic in a couple weeks to finalize a cessation plan. In the interim, I promised him that I would do some research on the method of cessation that works the best for the most people, as he would only like to try one thing.  Complete Medication List: 1)  Metoprolol Tartrate 50 Mg Tabs (Metoprolol tartrate) .... Take one tablet by mouth twice a day 2)  Prilosec Otc 20 Mg Tbec (Omeprazole magnesium) .... Take 1 tablet by mouth once a day 3)  Hydrochlorothiazide 25 Mg Tabs (Hydrochlorothiazide) .... Take 1 tablet by mouth once a day 4)  Simvastatin 20 Mg Tabs (Simvastatin) .... Take 1 tablet by mouth once a day 5)  Aspirin 325 Mg Tabs (Aspirin) .Marland Kitchen.. 1 tab daily  Other Orders: T-Lipid Profile ([id number])  Patient Instructions: 1)  Please schedule a follow-up appointment with Dr. Tobie Lords in 2 weeks. Prior to your next appointment, think of how we can assist you in stopping smoking, as this is very important for your health. 2)  Please wear the heart monitor as directed for 24 consecutive hours.  3)  Restart your Prevacid and start taking 325 mg of Aspirin daily to help with blood flow to your legs and to help prevent complications from your irregular heartbeat.   Prevention & Chronic Care Immunizations   Influenza vaccine: Not documented    Tetanus booster: Not documented    Pneumococcal vaccine: Not documented    H. zoster vaccine: Not documented  Colorectal Screening   Hemoccult: Not documented    Colonoscopy: Not documented  Other Screening   PSA: Not documented   Smoking status: current  (09/02/2009)   Smoking cessation counseling:  yes  (09/02/2009)  Lipids   Total Cholesterol: 164  (06/09/2007)   LDL: 108  (06/09/2007)   LDL Direct: Not documented   HDL: 39  (06/09/2007)   Triglycerides: 84  (06/09/2007)    SGOT (AST): 20  (06/21/2007)   SGPT (ALT): 17  (06/21/2007)   Alkaline phosphatase: 84  (06/21/2007)   Total bilirubin: 0.4  (06/21/2007)  Hypertension   Last Blood Pressure: 140 / 87  (09/02/2009)   Serum creatinine: 0.96  (08/13/2009)   Serum potassium 4.1  (08/13/2009)  Self-Management Support :    Hypertension self-management support: Not documented    Lipid self-management support: Not documented   Process Orders Check Orders Results:     [facility]: Check successful Tests Sent for requisitioning (September 02, 2009 2:59 PM):     09/02/2009: [facility] -- T-Lipid Profile [phone] (signed)

## 2011-01-22 NOTE — Letter (Signed)
Summary: Handout Printed  Printed Handout:  - *Patient Instructions 
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
## 2011-01-22 NOTE — Consult Note (Signed)
Summary: [facility]  [facility]   Imported By: Florinda Marker 10/14/2009 14:44:20  _____________________________________________________________________  External Attachment:    Type:   Image     Comment:   External Document  Appended Document: [facility]   Impression & Recommendations:  Problem # 1:  INTERMITTENT CLAUDICATION, RIGHT LEG (ICD-443.9) Assessment: Comment Only seen by [facility] in Oct 2010, moderate claudication, repeat ABI in six months, continue with risk factor reduction.   Complete Medication List: 1)  Metoprolol Tartrate 50 Mg Tabs (Metoprolol tartrate) .... Take one tablet by mouth twice a day 2)  Prilosec Otc 20 Mg Tbec (Omeprazole magnesium) .... Take 1 tablet by mouth once a day 3)  Hydrochlorothiazide 25 Mg Tabs (Hydrochlorothiazide) .... Take 1 tablet by mouth once a day 4)  Simvastatin 20 Mg Tabs (Simvastatin) .... Take 1 tablet by mouth once a day 5)  Aspirin 325 Mg Tabs (Aspirin) .Marland Kitchen.. 1 tab daily 6)  Chantix 0.5 Mg Tabs (Varenicline tartrate) .... Take 1 tablet daily for 3 days, then 1 tablet twice daily for 4 days, then 2 tablets twice daily indefinitely. 7)  Dulcolax 5 Mg Tbec (Bisacodyl) .... Day before procedure take 2 at 3pm and 2 at 8pm. 8)  Metoclopramide Hcl 10 Mg Tabs (Metoclopramide hcl) .... As per prep instructions. 9)  Miralax Powd (Polyethylene glycol 3350) .... As per prep  instructions.

## 2011-01-22 NOTE — Procedures (Signed)
Summary: Endo Prep  Endo Prep   Imported By: Lester [place] 10/21/2010 09:06:42  _____________________________________________________________________  External Attachment:    Type:   Image     Comment:   External Document

## 2011-01-22 NOTE — Progress Notes (Signed)
Summary: Refill/gh  Phone Note Refill Request Message from:  Fax from Pharmacy on November 26, 2009 9:44 AM  Refills Requested: Medication #1:  METOPROLOL TARTRATE 50 MG TABS Take one tablet by mouth twice a day   Last Refilled: 05/24/2009  Method Requested: Electronic Initial call taken by: Angelina Ok RN,  November 26, 2009 9:45 AM    Prescriptions: METOPROLOL TARTRATE 50 MG TABS (METOPROLOL TARTRATE) Take one tablet by mouth twice a day  #186 x 3   Entered and Authorized by:   Zoila Shutter MD   Signed by:   Zoila Shutter MD on 11/26/2009   Method used:   Electronically to        [facility] [phone] (retail)       [address]       [place], [place]  [address]       Ph: [phone]       Fax: [phone]   RxID:   [id number]
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
## 2011-01-22 NOTE — Letter (Signed)
Summary: Anticoagulation Modification Letter  [facility]  [address] [place], [place] [address]   Phone: [phone]  Fax: [phone]    October 17, 2010  Re:    Daniel [name] DOB:    03/04/1941 MRN:    [id number]    Dear Dr Shirlee Latch,  We have scheduled the above patient for an endoscopic procedure. Our records show that  he/she is on anticoagulation therapy. Please advise as to how long the patient may come off their therapy of Pradaxa prior to the scheduled procedure(s) on 10/30/10.   Please fax back/or route the completed form to Patty at [phone].  Thank you for your help with this matter.  Sincerely,  Chales Abrahams CMA Duncan Dull)   Physician Recommendation:  Hold Plavix 7 days prior ________________  Hold Coumadin 5 days prior ____________  Other ______________________________     Appended Document: Anticoagulation Modification Letter OK to hold Pradaxa prior to procedure.  DCCV was several months ago.  Holding Pradaxa 2 days prior will give proper washout.   Appended Document: Anticoagulation Modification Letter patty, please have him hold pradaxa for 2 days prior to the procedure  Appended Document: Anticoagulation Modification Letter no answer on home phone   Appended Document: Anticoagulation Modification Letter pt aware
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
## 2011-01-22 NOTE — Miscellaneous (Signed)
Summary: [facility] Previsit/prep  Clinical Lists Changes  Medications: Added new medication of DULCOLAX 5 MG  TBEC (BISACODYL) Day before procedure take 2 at 3pm and 2 at 8pm. - Signed Added new medication of METOCLOPRAMIDE HCL 10 MG  TABS (METOCLOPRAMIDE HCL) As per prep instructions. - Signed Added new medication of MIRALAX   POWD (POLYETHYLENE GLYCOL 3350) As per prep  instructions. - Signed Rx of DULCOLAX 5 MG  TBEC (BISACODYL) Day before procedure take 2 at 3pm and 2 at 8pm.;  #4 x 0;  Signed;  Entered by: Wyona Almas RN;  Authorized by: Iva Boop MD, [facility];  Method used: Electronically to [facility] [id number]*, [address], [place], [place]  [address], Ph: [phone], Fax: [phone] Rx of METOCLOPRAMIDE HCL 10 MG  TABS (METOCLOPRAMIDE HCL) As per prep instructions.;  #2 x 0;  Signed;  Entered by: Wyona Almas RN;  Authorized by: Iva Boop MD, [facility];  Method used: Electronically to [facility] [phone]*, [address], [place], [place]  [address], Ph: [phone], Fax: [phone] Rx of MIRALAX   POWD (POLYETHYLENE GLYCOL 3350) As per prep  instructions.;  #255gm x 0;  Signed;  Entered by: Wyona Almas RN;  Authorized by: Iva Boop MD, [facility];  Method used: Electronically to [facility] [phone]*, [address] [place], [place]  [address], Ph: [phone], Fax: [phone] Observations: Added new observation of NKA: T (10/11/2009 9:50)    Prescriptions: MIRALAX   POWD (POLYETHYLENE GLYCOL 3350) As per prep  instructions.  #255gm x 0   Entered by:   Wyona Almas RN   Authorized by:   Iva Boop MD, [facility]   Signed by:   Wyona Almas RN on 10/11/2009   Method used:   Electronically to        [facility] [phone]* (retail)       [address]       [place], [place]  [address]       Ph: [phone]       Fax: [phone]   RxID:   [id number] METOCLOPRAMIDE HCL 10 MG  TABS (METOCLOPRAMIDE HCL) As per prep instructions.  #2 x 0   Entered  by:   Wyona Almas RN   Authorized by:   Iva Boop MD, [facility]   Signed by:   Wyona Almas RN on 10/11/2009   Method used:   Electronically to        [facility] [phone]* (retail)       [address]       [place], [place]  [address]       Ph: [phone]       Fax: [phone]   RxID:   [id number] DULCOLAX 5 MG  TBEC (BISACODYL) Day before procedure take 2 at 3pm and 2 at 8pm.  #4 x 0   Entered by:   Wyona Almas RN   Authorized by:   Iva Boop MD, [facility]   Signed by:   Wyona Almas RN on 10/11/2009   Method used:   Electronically to        [facility] [phone]* (retail)       [address]       [place], [place]  [address]       Ph: [phone]       Fax: [phone]   RxID:   [id number]

## 2011-01-22 NOTE — Progress Notes (Signed)
Summary: scan results  Phone Note Call from Patient Call back at [phone]   Caller: Patient Reason for Call: Talk to Nurse, Lab or Test Results Summary of Call: scan results. pt would like to talk with a nurse. Initial call taken by: Roe Coombs,  November 10, 2010 12:29 PM  Follow-up for Phone Call        pt calling about abd CT scan --referred pt to Dr Christella Hartigan for further questions /instructions

## 2011-01-22 NOTE — Medication Information (Signed)
Summary: new to coumadin  Anticoagulant Therapy  Managed by: Bethena Midget, RN, BSN Referring MD: Shirlee Latch MD, [place] PCP: Silvestre Gunner MD Supervising MD: Shirlee Latch MD, [place] Indication 1: Atrial Fibrillation Lab Used: [facility] [place] Site: [address] INR POC 4.0 INR RANGE 2.0-3.0          Comments: Pt discharged from Hospital on 06/05/10 with Rx for Warfarin 4mg  and Pradaxa and he has been taking Warfarin at night and Pradaxa 150mg s two times a day. Denies any bleeding S&S.   Current Medications (verified): 1)  Simvastatin 20 Mg  Tabs (Simvastatin) .... Take 1 Tablet By Mouth Once A Day 2)  Pantoprazole Sodium 40 Mg Tbec (Pantoprazole Sodium) .... Please Take 40mg  (1 Tab) By Mouth Twice Daily. 3)  Spironolactone 25 Mg Tabs (Spironolactone) .... Take A Half Tablet By Mouth Once A Day. 4)  Amiodarone Hcl 200 Mg Tabs (Amiodarone Hcl) .... Please Take 1 Tab (200mg ) By Mouth Twice Daily. 5)  Digoxin 0.125 Mg Tabs (Digoxin) .... Take 1 Tablet (0.125 Mg) By Mouth Once A Day. 6)  Furosemide 20 Mg Tabs (Furosemide) .... Take 1 Tablet (20 Mg) By Mouth Once A Day. 7)  Nicotine 14 Mg/24hr Pt24 (Nicotine) .... Apply One 14 Mg Patch  To Your Skin Every Day For 2 Weeks, Then Switch To 7 Mg Patches. 8)  Metoprolol Succinate 50 Mg Xr24h-Tab (Metoprolol Succinate) .... Take 1 Tablet (50 Mg) By Mouth Once A Day. 9)  Enalapril Maleate 5 Mg Tabs (Enalapril Maleate) .... Take 1 Tablet (5 Mg) By Mouth Twice Daily. 10)  Nicotine 7 Mg/24hr Pt24 (Nicotine) .... After Finishing 14 Mg Patches, Begin Placing One 7 Mg Patch To Your Skin Every Day For 2 Weeks and Then You May Stop Wearing The Patches. 11)  Pradaxa 150 Mg Caps (Dabigatran Etexilate Mesylate) .... Take 1 Capsule Two Times A Day  Allergies (verified): No Known Drug Allergies  Anticoagulation Management History:      The patient comes in today for his initial visit for anticoagulation therapy.  Positive risk factors for  bleeding include an age of 70 years or older.  The bleeding index is 'intermediate risk'.  Positive CHADS2 values include History of CHF and History of HTN.  Negative CHADS2 values include Age > 70 years old.  Anticoagulation responsible provider: Shirlee Latch MD, [place].  INR POC: 4.0.  Cuvette Lot#: [id number].  Exp: 08/2011.    Anticoagulation Management Assessment/Plan:      Anticoagulation instructions were given to patient/spouse.  Results were reviewed/authorized by Bethena Midget, RN, BSN.  He was notified by Bethena Midget, RN, BSN.         Current Anticoagulation Instructions: INR 4.0 Stop warfarin. Continue Pradaxa 150mg s Twice a day. Recheck to clinic in 7 days for lab work. (CBC) Results to Dr Shirlee Latch
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
## 2011-01-22 NOTE — Progress Notes (Signed)
  Phone Note From Other Clinic   Caller: Provider Reason for Call: Patient [facility], Medication Check, Diagnosis Check Request: Talk with Provider Action Taken: Phone Call Completed, Provider Notified Details for Reason: Called by Eda Keys, PharmD from [facility] citing the patient who was scheduled for visit with me today in [facility] (and did not keep)--WAS seen by them. States the patient arrived on BOTH warfarin and dabigatran--since discharge.  Details of Complaint: No bleeding noted. Details of Action Taken: Per [facility]--they have taken the patient OFF of warfarin and the patient remains on dabigatran/PRADAXA oral direct thrombin inhibitor. Summary of Call: Did not keep appointment in [facility]. Seen at [facility]. Initial call taken by: Hulen Luster PharmD CACP
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
## 2011-01-22 NOTE — Progress Notes (Signed)
Summary: B/P readings  Phone Note Outgoing Call   Call placed by: Katina Dung, RN, BSN,  July 09, 2010 10:15 AM Call placed to: Patient Summary of Call: B/P readings  Follow-up for Phone Call        meds changes 06/24/10--call and get B/P readings--NA Katina Dung, RN, BSN  July 09, 2010 10:29 AM  NA Katina Dung, RN, BSN  July 09, 2010 1:43 PM  NA Katina Dung, RN, BSN  July 10, 2010 12:07 PM I talked with pt about recent B/P readings 06/25/10 135/75  06/26/10 116/66 06/27/10 130/65 06/28/10 148/74 06/29/10 132/62 07/04/10  118/66 BMP done 07/10/10--see lab report --medication adjustments made by Dr Rita Ohara will call pt in about 2 weeks to get readings--repeat BMP 07/18/10--appt with Dr Shirlee Latch 08/04/10     New/Updated Medications: SPIRONOLACTONE 25 MG TABS (SPIRONOLACTONE) Take one-half tablet  by mouth once a day. ENALAPRIL MALEATE 10 MG TABS (ENALAPRIL MALEATE) one-half  tablet twice a day   Current Medications (verified): 1)  Simvastatin 20 Mg  Tabs (Simvastatin) .... Take 1 Tablet By Mouth Once A Day 2)  Pantoprazole Sodium 40 Mg Tbec (Pantoprazole Sodium) .... Please Take 40mg  (1 Tab) By Mouth Twice Daily. 3)  Spironolactone 25 Mg Tabs (Spironolactone) .... Take One-Half Tablet  By Mouth Once A Day. 4)  Amiodarone Hcl 200 Mg Tabs (Amiodarone Hcl) .... One Daily 5)  Digoxin 0.125 Mg Tabs (Digoxin) .... Take 1 Tablet (0.125 Mg) By Mouth Once A Day. 6)  Furosemide 20 Mg Tabs (Furosemide) .... Take 1 Tablet (20 Mg) By Mouth Once A Day. 7)  Metoprolol Succinate 50 Mg Xr24h-Tab (Metoprolol Succinate) .... Take One and One-Half Tablets (75mg ) By Mouth Once A Day. 8)  Enalapril Maleate 10 Mg Tabs (Enalapril Maleate) .... One-Half  Tablet Twice A Day 9)  Pradaxa 150 Mg Caps (Dabigatran Etexilate Mesylate) .... Take 1 Capsule Two Times A Day 10)  Nicotine 14 Mg/24hr Pt24 (Nicotine) .... One Every 24 Hours  Allergies: No Known Drug Allergies

## 2011-01-22 NOTE — Procedures (Signed)
Summary: Flexible Sigmoidoscopy  Patient: Daniel [name] Note: All result statuses are Final unless otherwise noted.  Tests: (1) Flexible Sigmoidoscopy (FLX)  FLX Flexible Sigmoidoscopy                             DONE     [facility]     [address]     [place], [place]  [address]           FLEXIBLE SIGMOIDOSCOPY PROCEDURE REPORT           PATIENT:  Daniel [name], Radigan  MR#:  [id number]     BIRTHDATE:  [birth year], 68 yrs. old  GENDER:  male     ENDOSCOPIST:  Rachael Fee, MD     PROCEDURE DATE:  05/30/2010     PROCEDURE:  Flexible Sigmoidoscopy with biopsy     ASA CLASS:  Class III     INDICATIONS:  heme + anemia, needs to start anticoagulation for     new cardiac disease; colonoscopy 6-7 months ago with Dr. Leone Payor     found TAs only.     MEDICATIONS:   Fentanyl 50 mcg IV, Versed 5 mg IV           DESCRIPTION OF PROCEDURE:   After the risks benefits and     alternatives of the procedure were thoroughly explained, informed     consent was obtained.  Digital rectal exam was performed and     revealed no rectal masses.   The  endoscope was introduced through     the anus and advanced to the proximal transverse colon, without     limitations.  The quality of the prep was adequate.  The     instrument was then slowly withdrawn as the mucosa was fully     examined.     <<PROCEDUREIMAGES>>     There was yellow, whitish exudate in rectum only. The underlying     rectal mucosa was somewhat inflammed. Biopies taken from the     exudate to check for pseudomembranes/c. diff (see image4, image2,     image5, image3, and image1).   Retroflexed views in the rectum     revealed no abnormalities.    The scope was then withdrawn from     the patient and the procedure terminated.           COMPLICATIONS:  None           ENDOSCOPIC IMPRESSION:     1) Whitish/yellow exudate in rectum only. This appears     pseudomembranous. The ddx is C. diff vs ishemic colitis.   Although     the clinical situation supports ischemic colitis (new rapid afib,     CHF, EF 15%), the rectum is so well vascularized that it is a very     unusual site for ischemia.  I think this is more likely C. diff.           RECOMMENDATIONS:     Will start on flagyl TID for 10 days.     Will collect stool for C. diff times 3.     EGD now.           ______________________________     Rachael Fee, MD           CC:           n.     eSIGNED:   Reuel Boom  Marye Round at 05/30/2010 01:01 PM           Morene Crocker, [id number]  Note: An exclamation mark (!) indicates a result that was not dispersed into the flowsheet. Document Creation Date: 05/30/2010 1:02 PM _______________________________________________________________________  (1) Order result status: Final Collection or observation date-time: 05/30/2010 12:27 Requested date-time:  Receipt date-time:  Reported date-time:  Referring Physician:   Ordering Physician: Rob Bunting [phone]) Specimen Source:  Source: Launa Grill Order Number: [id number] Lab site:   Appended Document: Flexible Sigmoidoscopy Observations: Added new observation of FLEX SIGMOID: Indication: Other.-Heme + anemia  ENDOSCOPIC IMPRESSION:     1) Whitish/yellow exudate in rectum only. This appears     pseudomembranous. The ddx is C. diff vs ishemic colitis.  Although the clinical situation supports ischemic colitis (new rapid afib,    CHF, EF 15%), the rectum is so well vascularized that it is a very unusual site for ischemia.  I think this is more likely C. diff.  ***Microscopic Examination and Diagnosis***   1. RECTUM, BIOPSY, : - BENIGN COLONIC MUCOSA WITH PRESERVED CRYPT ARCHITECTURE, LAMINA PROPRIA HYALINIZATION, AND FOCAL SURFACE EXCUDATE.   - THERE IS NO EVIDENCE OF DYSPLASIA OR MALIGNANCY. (05/30/2010 10:40)   Flexible Sigmoidoscopy  Procedure date:  05/30/2010  Findings:      Indication: Other.-Heme + anemia  ENDOSCOPIC  IMPRESSION:     1) Whitish/yellow exudate in rectum only. This appears     pseudomembranous. The ddx is C. diff vs ishemic colitis.  Although the clinical situation supports ischemic colitis (new rapid afib,    CHF, EF 15%), the rectum is so well vascularized that it is a very unusual site for ischemia.  I think this is more likely C. diff.  ***Microscopic Examination and Diagnosis***   1. RECTUM, BIOPSY, : - BENIGN COLONIC MUCOSA WITH PRESERVED CRYPT ARCHITECTURE, LAMINA PROPRIA HYALINIZATION, AND FOCAL SURFACE EXCUDATE.   - THERE IS NO EVIDENCE OF DYSPLASIA OR MALIGNANCY.

## 2011-01-22 NOTE — Letter (Signed)
Summary: [facility] Medical Clearance   [facility] Medical Clearance   Imported By: Roderic Ovens 10/28/2010 13:59:12  _____________________________________________________________________  External Attachment:    Type:   Image     Comment:   External Document

## 2011-01-22 NOTE — Letter (Signed)
Summary: Patient Notice- Polyp Results  [facility]  [address] [place], [place] [address]   Phone: [phone]  Fax: [phone]        October 29, 2009 MRN: [id number]    Daniel [name] [address] [place], [place]  [address]    Dear Mr. Trost,  The polyps removed from your colon were adenomatous. This means that they were pre-cancerous or that  they had the potential to change into cancer over time.   I recommend that you have a repeat colonoscopy in 3 years to determine if you have developed any new polyps over time. If you develop any new rectal bleeding, abdominal pain or significant bowel habit changes, please contact us before then.  Please call us if you are having persistent problems or have questions about your condition that have not been fully answered at this time.  Sincerely,  Iva Boop MD, [facility]  This letter has been electronically signed by your physician.  Appended Document: Patient Notice- Polyp Results Letter mailed 11.12.10. [facility].  Appended Document: Patient Notice- Polyp Results Updated prevention form to reflect need for repeat colonscopy in 3 years due to adenomatous polyps on his colonoscopy this month.   Clinical Lists Changes  Observations: Added new observation of COLONNXTDUE: 10/28/2012 (11/07/2009 7:18) Added new observation of DM PROGRESS: N/A (11/07/2009 7:18) Added new observation of DM FSREVIEW: N/A (11/07/2009 7:18) Added new observation of PRIMARY MD: Silvestre Gunner MD (11/07/2009 7:18)       Complete Medication List: 1)  Metoprolol Tartrate 50 Mg Tabs (Metoprolol tartrate) .... Take one tablet by mouth twice a day 2)  Prilosec Otc 20 Mg Tbec (Omeprazole magnesium) .... Take 1 tablet by mouth once a day 3)  Hydrochlorothiazide 25 Mg Tabs (Hydrochlorothiazide) .... Take 1/2  tablet by mouth once a day. 4)  Simvastatin 20 Mg Tabs (Simvastatin) .... Take 1 tablet by mouth once a day 5)   Aspirin 325 Mg Tabs (Aspirin) .Marland Kitchen.. 1 tab daily 6)  Chantix 0.5 Mg Tabs (Varenicline tartrate) .... Take 1 tablet daily for 3 days, then 1 tablet twice daily for 4 days, then 2 tablets twice daily indefinitely. 7)  Dulcolax 5 Mg Tbec (Bisacodyl) .... Day before procedure take 2 at 3pm and 2 at 8pm. 8)  Metoclopramide Hcl 10 Mg Tabs (Metoclopramide hcl) .... As per prep instructions. 9)  Miralax Powd (Polyethylene glycol 3350) .... As per prep  instructions.      Prevention & Chronic Care Immunizations   Influenza vaccine: pt.refused  (09/16/2009)    Tetanus booster: Not documented    Pneumococcal vaccine: Not documented    H. zoster vaccine: Not documented  Colorectal Screening   Hemoccult: Not documented    Colonoscopy: DONE  (10/28/2009)   Colonoscopy action/deferral: GI referral  (09/16/2009)   Colonoscopy due: 10/28/2012  Other Screening   PSA: Not documented   Smoking status: current  (10/17/2009)   Smoking cessation counseling: yes  (10/17/2009)  Lipids   Total Cholesterol: 134  (09/02/2009)   LDL: 76  (09/02/2009)   LDL Direct: Not documented   HDL: 45  (09/02/2009)   Triglycerides: 63  (09/02/2009)    SGOT (AST): 20  (06/21/2007)   SGPT (ALT): 17  (06/21/2007)   Alkaline phosphatase: 84  (06/21/2007)   Total bilirubin: 0.4  (06/21/2007)  Hypertension   Last Blood Pressure: 136 / 87  (10/17/2009)   Serum creatinine: 0.96  (08/13/2009)   Serum potassium 4.1  (  08/13/2009)  Self-Management Support :    Hypertension self-management support: Not documented    Lipid self-management support: Not documented

## 2011-01-22 NOTE — Miscellaneous (Signed)
Summary: Appointment Canceled  Appointment status changed to canceled by [employer] on 08/04/2010 4:01 PM.  Cancellation Comments --------------------- echo dx 427.31/medicare/medicaid no precert rqd/sl  Appointment Information ----------------------- Appt Type:  CARDIOLOGY ANCILLARY VISIT      Date:  Tuesday, August 26, 2010      Time:  1:00 PM for 60 min   Urgency:  Routine   Made By:  Hoy Finlay Scheduler  To Visit:  [id number]    Reason:  echo dx 427.31/medicare/medicaid no precert rqd/sl  Appt Comments ------------- -- 08/04/10 16:01: ([facility]) CANCELED -- echo dx 427.31/medicare/medicaid no precert rqd/sl -- 06/24/10 15:15: ([facility]) BOOKED -- Routine CARDIOLOGY ANCILLARY VISIT at 08/26/2010 1:00 PM for 60 min echo dx 427.31/medicare/medicaid no precert rqd/sl

## 2011-01-22 NOTE — Assessment & Plan Note (Signed)
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 Randon Goldsmith) 2)  Please schedule a a B-12 injection in 1 month.
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
## 2011-01-22 NOTE — Letter (Signed)
Summary: Appt Reminder 2  [facility]  [address] [place], [place] [address]   Phone: [phone]  Fax: [phone]        August 19, 2010 MRN: [id number]    Daniel [name] [address] [place], [place]  [address]    Dear [name],   You have an appointment with Dr.Daniel Christella Hartigan on 10-17-10 at 1:45pm. Please remember to bring a complete list of the medicines you are taking, your insurance card and your co-pay.  If you have to cancel or reschedule this appointment, please call before 5:00 pm the evening before to avoid a cancellation fee.  If you have any questions or concerns, please call [phone].    Sincerely,    Laureen Ochs LPN  Appended Document: Appt Reminder 2 Letter mailed to patient.

## 2011-01-22 NOTE — Progress Notes (Signed)
Summary: Results of aortic ultrasound and request for FLP  Phone Note Outgoing Call   Call placed by: Yvonne Kendall MD,  May 31, 2007 3:29 PM Call placed to: Patient Reason for Call: Discuss lab or test results Summary of Call: Called pt to discuss results of abdominal US.  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  I would like for Daniel [name] to have a fasting lipid panel done before his next clinic appointment in 3 weeks.  A lab visit has been scheduled for 06/08/07 at 9 AM for a FLP.  Pt was not home, but a message was left on his machine to call the clinic.  This information will be relayed to the pt by the RN if the pt calls back.  New Problems: ATHEROSCLEROSIS, AORTIC (ICD-440.0)  New Problems: ATHEROSCLEROSIS, AORTIC (ICD-440.0)

## 2011-01-22 NOTE — Assessment & Plan Note (Signed)
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

## 2011-01-22 NOTE — Assessment & Plan Note (Signed)
  Review of gastrointestinal problems: 1. H. pylori negative gastritis on EGD June 2011. 2. incidentally noted sub-epithelial gastric lesion on EGD June 2011, approximately one to 2 cm     History of Present Illness Visit Type: Follow-up Visit Primary GI MD: Stan Head MD [facility] Primary Provider: Fransico Meadow Requesting Provider: Fransico Meadow Chief Complaint: discuss EUS History of Present Illness:     very pleasant 70 year old man whom I last saw at the time of an upper endoscopy about 4-5 months ago when he was hospitalized. I noted a 1-2 cm subepithelial Olding in his proximal stomach. She also had gastritis, H. pylori negative. He has no significant dyspepsia or GERD symptoms to suggest that this lesion is asymptomatic. He is here to discuss further evaluation.           Current Medications (verified): 1)  Simvastatin 20 Mg  Tabs (Simvastatin) .... Take 1 Tablet By Mouth Once A Day 2)  Pantoprazole Sodium 40 Mg Tbec (Pantoprazole Sodium) .... Please Take 40mg  (1 Tab) By Mouth Twice Daily. 3)  Amiodarone Hcl 200 Mg Tabs (Amiodarone Hcl) .... One Daily 4)  Metoprolol Succinate 50 Mg Xr24h-Tab (Metoprolol Succinate) .... Take One and One-Half Tablets (75mg ) By Mouth Once A Day. 5)  Pradaxa 150 Mg Caps (Dabigatran Etexilate Mesylate) .... Take 1 Capsule Two Times A Day 6)  Enalapril Maleate 10 Mg Tabs (Enalapril Maleate) .... One Half Tablet Twice A Day  Allergies (verified): No Known Drug Allergies  Vital Signs:  Patient profile:   70 year old male Height:      70 inches Weight:      113 pounds Pulse rate:   64 / minute Pulse rhythm:   regular BP sitting:   136 / 72  (left arm)  Vitals Entered By: Milford Cage NCMA (October 17, 2010 1:49 PM)  Physical Exam  Additional Exam:  Constitutional: very thin, otherwise well-appearing Psychiatric: alert and oriented times 3 Abdomen: soft, non-tender, non-distended, normal bowel sounds    Impression &  Recommendations:  Problem # 1:  subepithelial lesion in proximal stomach this may be a lipoma, possibly a leiomyoma or GIST in proximal stomach. I think it is best to evaluate with endoscopic ultrasound and fine-needle aspiration if it is greater than one or 2 cm. He is on a blood thinning medicine and we will consult his cardiologist of holding it for 5 days prior to the procedure. It is not safe to hold and I think we could still proceed at least get very accurate ultrasonographic measurements of the lesion that could be used to follow it serially.  Patient Instructions: 1)  We will get in touch with Dr. Shirlee Latch about holding your pradaxa for 5 days prior to an EGD/EUS to get more information about the lesion in your stomach. 2)  You will be scheduled to have an upper endoscopic ultrasound at [facility]. 3)  A copy of this information will be sent to Drs. Ayesha Rumpf. 4)  The medication list was reviewed and reconciled.  All changed / newly prescribed medications were explained.  A complete medication list was provided to the patient / caregiver.  Appended Document: Orders Update/EUS    Clinical Lists Changes  Orders: Added new Test order of EUS-Upper (EUS-Upper) - Signed

## 2011-01-22 NOTE — Progress Notes (Signed)
Summary: refill/ hla  Phone Note Refill Request Message from:  Fax from Pharmacy on April 09, 2008 2:27 PM  Refills Requested: Medication #1:  lopressor 50mg  Take 1 tablet twice daily   Dosage confirmed as above?Dosage Confirmed   Brand Name Necessary? No   Supply Requested: 3 months Initial call taken by: Marin Roberts RN,  April 09, 2008 2:27 PM  Follow-up for Phone Call        Mr. brathwaite needs to be seen in the clinic for a routine check up. Follow-up by: Valetta Close MD,  April 11, 2008 1:47 AM      Prescriptions: SIMVASTATIN 20 MG  TABS (SIMVASTATIN) Take 1 tablet by mouth once a day  #90 x 0   Entered and Authorized by:   Valetta Close MD   Signed by:   Valetta Close MD on 04/11/2008   Method used:   Electronically sent to ...       [address]*       [address]       [place], [place]  [address]       Ph: [phone]       Fax: [phone]   RxID:   [id number] HYDROCHLOROTHIAZIDE 25 MG  TABS (HYDROCHLOROTHIAZIDE) Take 1 tablet by mouth once a day  #90 x 0   Entered and Authorized by:   Valetta Close MD   Signed by:   Valetta Close MD on 04/11/2008   Method used:   Electronically sent to ...       [address]       [address] [place] [address]       [place], [place]  [address]       Ph: [phone]       Fax: [phone]   RxID:   [id number] PRILOSEC OTC 20 MG TBEC (OMEPRAZOLE MAGNESIUM) Take 1 tablet by mouth once a day  #30 x 1   Entered and Authorized by:   Valetta Close MD   Signed by:   Valetta Close MD on 04/11/2008   Method used:   Electronically sent to ...       [address]*       [address]       [place], [place]  [address]       Ph: [phone]       Fax: [phone]   RxID:   [id number] METOPROLOL TARTRATE 50 MG TABS (METOPROLOL TARTRATE) Take one tablet by mouth twice a day  #186 x 0   Entered and Authorized by:   Valetta Close MD   Signed by:   Valetta Close MD on 04/11/2008   Method used:   Electronically sent to  ...       [address]*       [address]       [place], [place]  [address]       Ph: [phone]       Fax: [phone]   RxID:   [id number]

## 2011-01-22 NOTE — Medication Information (Signed)
Summary: Coumadin Clinic  Anticoagulant Therapy  Managed by: Inactive Referring MD: Shirlee Latch MD, Freida Busman PCP: Nelda Bucks DO Supervising MD: Eden Emms MD, Theron Arista Indication 1: Atrial Fibrillation Lab Used: [facility] [place] Site: [address] INR RANGE 2.0-3.0          Comments: on Pradaxa  Allergies: No Known Drug Allergies  Anticoagulation Management History:      Positive risk factors for bleeding include an age of 70 years or older.  The bleeding index is 'intermediate risk'.  Positive CHADS2 values include History of CHF and History of HTN.  Negative CHADS2 values include Age > 70 years old.  Anticoagulation responsible provider: Eden Emms MD, Theron Arista.  Exp: 08/2011.    Anticoagulation Management Assessment/Plan:      Anticoagulation instructions were given to patient/spouse.  Results were reviewed/authorized by Inactive.         Prior Anticoagulation Instructions: INR 4.0 Stop warfarin. Continue Pradaxa 150mg s Twice a day. Recheck to clinic in 7 days for lab work. (CBC) Results to Dr Shirlee Latch

## 2011-01-22 NOTE — Procedures (Signed)
Summary: Endoscopic Ultrasound  Patient: Daniel [name] Note: All result statuses are Final unless otherwise noted.  Tests: (1) Endoscopic Ultrasound (EUS)  EUS Endoscopic Ultrasound                             DONE     [facility]     [address] [place], [place]  [address]           ENDOSCOPIC ULTRASOUND PROCEDURE REPORT           PATIENT:  Daniel [name], Daniel [name]  MR#:  [id number]     BIRTHDATE:  1941/03/23  GENDER:  male     ENDOSCOPIST:  Rachael Fee, MD     PROCEDURE DATE:  10/30/2010     PROCEDURE:  Upper EUS     ASA CLASS:  Class II     INDICATIONS:  1-2cm subepithelial lesion in stomach, noted on EGD     June 2011     MEDICATIONS:   Fentanyl 100 mcg IV, Versed 10 mg IV           DESCRIPTION OF PROCEDURE:   After the risks benefits and     alternatives of the procedure were  explained, informed consent     was obtained. The patient was then placed in the left, lateral,     decubitus postion and IV sedation was administered. Throughout the     procedure, the patient's blood pressure, pulse and oxygen     saturations were monitored continuously.  Under direct     visualization, the Pentax Radial EUS [id number] endoscope was     introduced through the  and advanced to the .  Water was used as     necessary to provide an acoustic interface.  Upon completion of     the imaging, water was removed and the patient was sent to the     recovery room in satisfactory condition.     <<PROCEDUREIMAGES>>           Endoscopic findings:     1. Normal esophagus     2. 2cm bulging inward of gastric mucosa in proximal stomach,     approximately 3-4cm from GE junction.     3. Otherwise normal stomach, duodenum.           EUS findings:     1. The subepithelial lesion above corresponded with a 2.9cm,     heavily calcified  (posterior accoustic shadowing), round leison     on EUS.  This was not clearly lying within the gastric wall.     There was no associated soft tissue  mass.     2.  Gastric wall was normal throughout.     3. No perigastric adenopathy.     4. Panreatic parenchyma and main pancreatic duct were normal.     5. CBD was non-dilated.     6. Gallbladder was normal.           Impression:     The subepithelial lesion noted in stomach correlates with a 2.9cm,     calcified lesion that directly abuts but does not involve the wall     of the stomach.  During the EUS examination I reviewed other     recent imaging and noted a 2.8cm calcified, cystic lesion on June     2011 ultrasound report ("left upper quadrant, probably within the  spleen") that likely represents the same lesion seen by EUS today.     FNA not performed.   Perhaps remote granulomatous process in     spleen, other (resolved) splenic infection/reaction.    This was     noted incidentally.  I will set up CT scan abd, and as long as it     also correlates with splenic calcifcation, then no further workup     is needed.           ______________________________     Rachael Fee, MD           cc: Stan Head, MD           n.     eSIGNED:   Rachael Fee at 10/30/2010 08:51 AM           Morene Crocker, [id number]  Note: An exclamation mark (!) indicates a result that was not dispersed into the flowsheet. Document Creation Date: 10/30/2010 8:53 AM _______________________________________________________________________  (1) Order result status: Final Collection or observation date-time: 10/30/2010 08:32 Requested date-time:  Receipt date-time:  Reported date-time:  Referring Physician:   Ordering Physician: Rob Bunting [phone]) Specimen Source:  Source: Launa Grill Order Number: [id number] Lab site:   Appended Document: Endoscopic Ultrasound patty, he needs CT scan ABD with IV and oral contrast to check on splenic lesion  Appended Document: Orders Update pt to get labs before and pick up contrast and instructions   Clinical Lists Changes  Orders: Added  new Referral order of CT Abdomen (CT Abd) - Signed      Appended Document: Orders Update    Clinical Lists Changes  Orders: Added new Referral order of CT Abdomen (CT Abd) - Signed
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
## 2011-01-22 NOTE — Miscellaneous (Signed)
Summary: Orders Update pft charges  Clinical Lists Changes  Orders: Added new Service order of Carbon Monoxide diffusing w/capacity (94720) - Signed Added new Service order of Lung Volumes (94240) - Signed Added new Service order of Spirometry (Pre & Post) (94060) - Signed 

## 2011-01-22 NOTE — Assessment & Plan Note (Signed)
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

## 2011-01-22 NOTE — Progress Notes (Signed)
Summary: rescheduling head CT   Phone Note Outgoing Call   Call placed by: Katina Dung, RN, BSN,  August 14, 2010 5:50 PM Call placed to: Patient Summary of Call: reschedule head CT  Follow-up for Phone Call        pt did not show for the head CT scheduled for 08/11/10--I talked with pt by telephone --pt states he thought it was scheduled for another day-pt states he has not had a headache in over a week-he has remembered that he had a wisdom tooth extracted in the past with some bone and nerve involvement that has caused a headache similar to the right sided headache he had the first of last week--pt does not want to reschedule head CT right now but knows he can call if he decides he wants to have this done

## 2011-01-22 NOTE — Procedures (Signed)
Summary: Upper Endoscopy  Patient: Daniel [name] Note: All result statuses are Final unless otherwise noted.  Tests: (1) Upper Endoscopy (EGD)   EGD Upper Endoscopy       DONE     [facility]     [address]     [place], [place]  [address]           ENDOSCOPY PROCEDURE REPORT           PATIENT:  [name], [name]  MR#:  [id number]     BIRTHDATE:  July 03, 1941, 68 yrs. old  GENDER:  male     ENDOSCOPIST:  Rachael Fee, MD     PROCEDURE DATE:  05/30/2010     PROCEDURE:  EGD with biopsy     ASA CLASS:  Class III     INDICATIONS:  heme + stools; needs anticoagulation for new cardiac     disease (afib, RVR)     MEDICATIONS:   There was residual sedation effect present from     prior procedure., Versed 1 mg IV     DESCRIPTION OF PROCEDURE:   After the risks benefits and     alternatives of the procedure were thoroughly explained, informed     consent was obtained.  The  endoscope was introduced through the     mouth and advanced to the second portion of the duodenum, without     limitations.  The instrument was slowly withdrawn as the mucosa     was fully examined.     <<PROCEDUREIMAGES>>     There moderate, non-specific gastritis. This was biopsied to check     for H. pylori. There was no ulceration or active oozing (see     image1 and image6).  A mass was found. There was a 1-2cm     submucosal lesion in proximal stomach. No overlying ulceration     (see image5 and image7).  Otherwise the examination was normal     (see image3 and image2).    Retroflexed views revealed no     abnormalities.    The scope was then withdrawn from the patient     and the procedure completed.     COMPLICATIONS:  None     ENDOSCOPIC IMPRESSION:     1) Moderate gastritis throughout stomach.  Biopsied to check for     H. pylori     2) 1-2cm submucosal lesion in proximal stomach.  No biopsied.     3) Otherwise normal examination           RECOMMENDATIONS:     If biopsies  confirm H. pylori, he should be started on the     appropriate antibiotics.     No concerning lesions on this exam, recent full colonoscopy, or     today's flex sig that would preclude anticoagulation. Cardiologist     can feel free to use any medicines they feel are appropriate to     treat his heart.  Please call GI if he has overt bleeding.     He will need further evaluation of the proximal stomach,     submucosal lesion (at [facility]) after cardiac issues have     stablized.  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           ______________________________     Rachael Fee, MD           cc:  Stan Head, MD  n.     eSIGNED:   Rachael Fee at 05/30/2010 01:09 PM           Morene Crocker, [phone]  Note: An exclamation mark (!) indicates a result that was not dispersed into the flowsheet. Document Creation Date: 05/30/2010 1:11 PM _______________________________________________________________________  (1) Order result status: Final Collection or observation date-time: 05/30/2010 12:27 Requested date-time:  Receipt date-time:  Reported date-time:  Referring Physician:   Ordering Physician: Rob Bunting [phone]) Specimen Source:  Source: Launa Grill Order Number: [id number] Lab site:   Appended Document: Upper Endoscopy  EGD  Procedure date:  05/30/2010  Findings:      Findings: Gastritis-Biopsied ENDOSCOPIC IMPRESSION:     1) Moderate gastritis throughout stomach.  Biopsied to check for     H. pylori     2) 1-2cm submucosal lesion in proximal stomach.  No biopsied.     3) Otherwise normal examination  ***Microscopic Examination and Diagnosis*** 1. STOMACH, BIOPSY, : - CHRONIC GASTRITIS.   - THERE IS NO EVIDENCE OF HELICOBACTER PYLORI, INTESTINAL METAPLASIA, DYSPLASIA OR MALIGNANCY.

## 2011-01-22 NOTE — Progress Notes (Signed)
Summary: CT Scan results  Phone Note Call from Patient Call back at [phone]   Caller: Patient Call For: Dr. Christella Hartigan Reason for Call: Lab or Test Results Summary of Call: Calling about his CT Scan results Initial call taken by: Karna Christmas,  November 10, 2010 12:22 PM  Follow-up for Phone Call        pt was given the results of the CT scan and the phone number of Herbie Baltimore.   Follow-up by: Chales Abrahams CMA Duncan Dull),  November 10, 2010 12:26 PM

## 2011-01-22 NOTE — Discharge Summary (Signed)
Summary: Hospital Discharge Update    Hospital Discharge Update:  Date of Admission: 05/26/2010 Date of Discharge: 06/05/2010  Brief Summary:  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 ischemic colitis.  Labs needed at follow-up: CBC with differential, Comprehensive metabolic panel, PT/INR  Other labs needed at follow-up: -Pulmonary Function Testing -TSH/T4 -EKG -Digoxin  Other follow-up issues:  -Afib: EKG for rhythm, INR for warfarin, LFT/TFT/PFT for amio -HF: repeat echo w/i 1-4mo. Check Cr for diuresis + beta blocker + amio + ACEi. 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 Removed medication of ASPIRIN 325 MG TABS (ASPIRIN) 1 tab daily Removed medication of CHANTIX 0.5 MG TABS (VARENICLINE TARTRATE) Take 1 tablet daily for 3 days, then 1 tablet twice daily for 4 days, then 2 tablets twice daily indefinitely. Added new medication of  PANTOPRAZOLE SODIUM 40 MG TBEC (PANTOPRAZOLE SODIUM) Please take 40mg  (1 tab) by mouth twice daily. - Signed Removed medication of PRILOSEC OTC 20 MG TBEC (OMEPRAZOLE MAGNESIUM) Take 1 tablet by mouth once a day Added new medication of SPIRONOLACTONE 25 MG TABS (SPIRONOLACTONE) Take a half tablet by mouth once a day. - Signed Added new medication of AMIODARONE HCL 200 MG TABS (AMIODARONE HCL) Please take 1 tab (200mg ) by mouth twice daily. - Signed Added new medication of DIGOXIN 0.125 MG TABS (DIGOXIN) Take 1 tablet (0.125 mg) by mouth once a day. - Signed Added new medication of FUROSEMIDE 20 MG TABS (FUROSEMIDE) Take 1 tablet (20 mg) by mouth once a day. - Signed Added new medication of NICOTINE 14 MG/24HR PT24 (NICOTINE) Apply one 14 mg patch  to your skin every day for 2 weeks, then switch to 7 mg patches. - Signed Added new medication of METOPROLOL SUCCINATE 50 MG XR24H-TAB (METOPROLOL SUCCINATE) Take 1 tablet (50 mg) by mouth once a day. - Signed Added new medication of ENALAPRIL MALEATE 5 MG TABS (ENALAPRIL MALEATE) Take 1 tablet (5 mg) by mouth twice daily. - Signed Added new medication of NICOTINE 7 MG/24HR PT24 (NICOTINE) After finishing 14 mg patches, begin placing one 7 mg patch to your skin every day for 2 weeks and then you may stop wearing the patches. - Signed Added new medication of WARFARIN SODIUM 4 MG TABS (WARFARIN SODIUM) Take 1 tablet (4 mg) by mouth once each night until your Coumadin clinic visit. They will adjust your dose of warfarin at that time. - Signed Rx of AMIODARONE HCL 200 MG TABS (AMIODARONE HCL) Please take 1 tab (200mg ) by mouth twice daily.;  #62 x 3;  Signed;  Entered by: Lars Mage MD;  Authorized by: Lars Mage MD;  Method used: Electronically to [facility] [phone]*, [address] [place], [place]  [address], Ph: [phone], Fax: [phone] Rx of SPIRONOLACTONE 25 MG TABS (SPIRONOLACTONE) Take a half tablet by mouth once a day.;  #16 x 3;  Signed;   Entered by: Lars Mage MD;  Authorized by: Lars Mage MD;  Method used: Electronically to [facility] [phone]*, [address] [place], [place]  [address], Ph: [phone], Fax: [phone] Rx of PANTOPRAZOLE SODIUM 40 MG TBEC (PANTOPRAZOLE SODIUM) Please take 40mg  (1 tab) by mouth twice daily.;  #62 x 3;  Signed;  Entered by: Lars Mage MD;  Authorized by: Lars Mage MD;  Method used: Electronically to [facility] [phone]*, [address], [place], [place]  [address], Ph: [phone], Fax: [phone] Rx of SIMVASTATIN 20 MG  TABS (SIMVASTATIN) Take 1 tablet by mouth once a day;  #31 x 3;  Signed;  Entered by: Lars Mage MD;  Authorized by: Lars Mage MD;  Method used: Electronically to [facility] [phone]*, [address], [place], [place]  [address], Ph: [phone], Fax: [phone] Rx of DIGOXIN 0.125 MG TABS (DIGOXIN) Take 1 tablet (0.125 mg) by mouth once a day.;  #31 x 3;  Signed;  Entered by: Lars Mage MD;  Authorized by: Lars Mage MD;  Method used: Electronically to [facility] [phone]*, [address] [place], [place]  [address], Ph: [phone], Fax: [phone] Rx of FUROSEMIDE 20 MG TABS (FUROSEMIDE) Take 1 tablet (20 mg) by mouth once a day.;  #31 x 3;  Signed;  Entered by: Lars Mage MD;  Authorized by: Lars Mage MD;  Method used: Electronically to [facility] [phone]*, [address], [place], [place]  [address], Ph: [phone], Fax: [phone] Rx of NICOTINE 14 MG/24HR PT24 (NICOTINE) Apply one 14 mg patch  to your skin every day for 2 weeks, then switch to 7 mg patches.;  #14 x 0;  Signed;  Entered by: Lars Mage MD;  Authorized by: Lars Mage MD;  Method used: Electronically to [facility] [phone]*, [address] [place], [place]  [address], Ph: [phone], Fax: [phone] Rx of METOPROLOL SUCCINATE 50 MG XR24H-TAB (METOPROLOL SUCCINATE) Take 1 tablet (50 mg) by mouth once a day.;  #31 x 3;  Signed;  Entered by: Lars Mage MD;  Authorized by: Lars Mage MD;  Method used: Electronically to [facility] [phone]*, [address] [place] [address] [place], [place]  [address], Ph: [phone], Fax: [phone] Rx of ENALAPRIL MALEATE 5 MG TABS (ENALAPRIL MALEATE) Take 1 tablet (5 mg) by mouth twice daily.;  #62 x 3;  Signed;  Entered by: Lars Mage MD;  Authorized by: Lars Mage MD;  Method used: Electronically to [facility] [phone]*, [address], [place], [place]  [address], Ph: [phone], Fax: [phone] Rx of WARFARIN SODIUM 4 MG TABS (WARFARIN SODIUM) Take 1 tablet (4 mg) by mouth once each night until your Coumadin clinic visit. They will adjust your dose of warfarin at that time.;  #5 x 0;  Signed;  Entered by: Lars Mage MD;  Authorized by: Lars Mage MD;  Method used: Electronically to [facility] [phone]*, [address], [place], [place]  [address], Ph: [phone], Fax: [phone] Rx of NICOTINE 7 MG/24HR PT24 (NICOTINE) After finishing 14 mg patches, begin placing one 7 mg patch to your skin every day for 2 weeks and then you may stop wearing the patches.;  #14 x 0;  Signed;  Entered by: Shonice Wrisley Eben Burow  MD;  Authorized by: Lars Mage MD;  Method used: Electronically to [facility] [phone]*, [address] [place], [place]  [address], Ph: [phone], Fax: [phone]  The medication, problem, and allergy lists have been updated.  Please see the dictated discharge summary for details.  Discharge medications:  SIMVASTATIN 20 MG  TABS (SIMVASTATIN) Take 1 tablet by mouth once a day PANTOPRAZOLE SODIUM 40 MG TBEC (PANTOPRAZOLE SODIUM) Please take 40mg  (1 tab) by mouth twice daily. SPIRONOLACTONE 25 MG TABS (SPIRONOLACTONE) Take a half tablet by mouth once a day. AMIODARONE HCL 200 MG TABS (AMIODARONE HCL) Please take 1 tab (200mg ) by mouth twice daily. DIGOXIN 0.125 MG TABS (DIGOXIN) Take 1 tablet (0.125 mg) by mouth once a day. FUROSEMIDE 20 MG TABS (FUROSEMIDE) Take 1 tablet (20 mg) by mouth once a day. NICOTINE 14 MG/24HR PT24  (NICOTINE) Apply one 14 mg patch  to your skin every day for 2 weeks, then switch to 7 mg patches. METOPROLOL SUCCINATE 50 MG XR24H-TAB (METOPROLOL SUCCINATE) Take 1 tablet (50 mg) by mouth once a day. ENALAPRIL MALEATE 5 MG TABS (ENALAPRIL MALEATE) Take 1 tablet (5 mg) by mouth twice daily. NICOTINE 7 MG/24HR PT24 (NICOTINE) After finishing 14 mg patches, begin placing one 7 mg patch to your skin every day for 2 weeks and then you may stop wearing the patches. WARFARIN SODIUM 4 MG TABS (WARFARIN SODIUM) Take 1 tablet (4 mg) by mouth once each night until your Coumadin clinic visit. They will adjust your dose of warfarin at that time.  Other patient instructions:  You are being discharged on the above listed medications. Please take all medications as prescribed. You have the following appointments:      * Monday June 09, 2010 at 2:30 pm with [facility] [phone]) for INR and Coumadin      * Tuesday June 24, 2010 at 1:45 pm with Dr. Shirlee Latch at [facility] [phone])      * Wednesday June 25, 2010 at 4:00 pm with Dr. Lars [facility] [phone])      * Wednesday July 16, 2010 at 10:30 am with Dr. Elenore Paddy at [facility] [phone]) Please return to care immediately if you experience chest pain, palpitations, shortness of breath, or significantly decreased ability to exercise. You will need to slowly increase your activity level. Please restrict the amount of salt in your diet.  Note: Hospital Discharge Medications & Other Instructions handout was printed, one copy for patient and a second copy to be placed in hospital chart.  Prescriptions: NICOTINE 7 MG/24HR PT24 (NICOTINE) After finishing 14 mg patches, begin placing one 7 mg patch to your skin every day for 2 weeks and then you may stop wearing the patches.  #14 x 0   Entered and Authorized by:   Lars Mage MD   Signed by:   Lars Mage MD on 06/05/2010   Method used:   Electronically  to        [id number]* (retail)       [address]       [place], [place]  [address]       Ph: [phone]       Fax: [phone]   RxID:   [id number] WARFARIN SODIUM 4 MG TABS (WARFARIN SODIUM) Take 1 tablet (4 mg) by mouth once each night until your Coumadin clinic visit. They will adjust your dose of warfarin at that time.  #5 x 0   Entered and  Authorized by:   Lars Mage MD   Signed by:   Lars Mage MD on 06/05/2010   Method used:   Electronically to        [id number]* (retail)       [address]       [place], [place]  [address]       Ph: [phone]       Fax: [phone]   RxID:   [id number] ENALAPRIL MALEATE 5 MG TABS (ENALAPRIL MALEATE) Take 1 tablet (5 mg) by mouth twice daily.  #62 x 3   Entered and Authorized by:   Lars Mage MD   Signed by:   Lars Mage MD on 06/05/2010   Method used:   Electronically to        [facility] [phone]* (retail)       [address]       [place], [place]  [address]       Ph: [phone]       Fax: [phone]   RxID:   [id number] METOPROLOL SUCCINATE 50 MG XR24H-TAB (METOPROLOL SUCCINATE) Take 1 tablet (50 mg) by mouth once a day.  #31 x 3   Entered and Authorized by:   Lars Mage MD   Signed by:   Lars Mage MD on 06/05/2010   Method used:   Electronically to        [id number]* (retail)       [address]       [place], [place]  [address]       Ph: [phone]       Fax: [phone]   RxID:   [id number] NICOTINE 14 MG/24HR PT24 (NICOTINE) Apply one 14 mg patch  to your skin every day for 2 weeks, then switch to 7 mg patches.  #14 x 0   Entered and Authorized by:   Lars Mage MD   Signed by:   Lars Mage MD on 06/05/2010   Method used:   Electronically to        [facility] [phone]* (retail)       [address]       [place], [place]  [address]       Ph: [phone]       Fax: [phone]   RxID:   [id number] FUROSEMIDE 20 MG TABS  (FUROSEMIDE) Take 1 tablet (20 mg) by mouth once a day.  #31 x 3   Entered and Authorized by:   Lars Mage MD   Signed by:   Lars Mage MD on 06/05/2010   Method used:   Electronically to        [facility] [phone]* (retail)       [address]       [place], [place]  [address]       Ph: [phone]       Fax: [phone]   RxID:   [id number] DIGOXIN 0.125 MG TABS (DIGOXIN) Take 1 tablet (0.125 mg) by mouth once a day.  #31 x 3   Entered and Authorized by:   Lars Mage MD   Signed by:   Lars Mage MD on 06/05/2010   Method used:   Electronically to        [facility] [phone]* (retail)       [address]       [place], [place]  [address]       Ph: [phone]  Fax: [phone]   RxID:   [id number] SIMVASTATIN 20 MG  TABS (SIMVASTATIN) Take 1 tablet by mouth once a day  #31 x 3   Entered and Authorized by:   Lars Mage MD   Signed by:   Lars Mage MD on 06/05/2010   Method used:   Electronically to        [facility] [phone]* (retail)       [address]       [place], [place]  [address]       Ph: [phone]       Fax: [phone]   RxID:   [id number] PANTOPRAZOLE SODIUM 40 MG TBEC (PANTOPRAZOLE SODIUM) Please take 40mg  (1 tab) by mouth twice daily.  #62 x 3   Entered and Authorized by:   Lars Mage MD   Signed by:   Lars Mage MD on 06/05/2010   Method used:   Electronically to        [facility] [phone]* (retail)       [address]       [place], [place]  [address]       Ph: [phone]       Fax: [phone]   RxID:   [id number] SPIRONOLACTONE 25 MG TABS (SPIRONOLACTONE) Take a half tablet by mouth once a day.  #16 x 3   Entered and Authorized by:   Lars Mage MD   Signed by:   Lars Mage MD on 06/05/2010   Method used:   Electronically to        [facility] [phone]* (retail)       [address]       [place], [place]  [address]       Ph: [phone]       Fax: [phone]   RxID:    [id number] AMIODARONE HCL 200 MG TABS (AMIODARONE HCL) Please take 1 tab (200mg ) by mouth twice daily.  #62 x 3   Entered and Authorized by:   Lars Mage MD   Signed by:   Lars Mage MD on 06/05/2010   Method used:   Electronically to        [facility] [phone]* (retail)       [address]       [place], [place]  [address]       Ph: [phone]       Fax: [phone]   RxID:   [id number]

## 2011-01-22 NOTE — Letter (Signed)
Summary: Results Letter  [facility]  [address] [place], [place] [address]   Phone: [phone]  Fax: [phone]        November 10, 2010 MRN: [id number]    Daniel [name] [address] [place], [place]  [address]    Dear [name],  We have been unable to reach you by phone regarding your CT scan results please call at your earliest convenience to discuss.  Thank you for your time.       Sincerely,  Chales Abrahams CMA (AAMA)  This letter has been electronically signed by your physician.  Appended Document: Results Letter letter mailed

## 2011-01-22 NOTE — Assessment & Plan Note (Signed)
Summary: FU BP/EST/VS   Vital Signs:  Patient Profile:   70 Years Old Male Height:     70 inches (177.80 cm) Weight:      120.3 pounds (54.68 kg) BMI:     17.32 Temp:     97.0 degrees F (36.11 degrees C) oral Pulse rate:   66 / minute BP sitting:   159 / 67  (left arm)  Vitals Entered By: Krystal Eaton Duncan Dull) (July 12, 2007 8:42 AM)             Is Patient Diabetic? No Nutritional Status BMI of < 19 = underweight  Does patient need assistance? Functional Status Self care Ambulation Normal   PCP:  Valetta Close MD   History of Present Illness: [name] is a 69 y/o male with a hx of PUD with a bleed in 2007, supraventricular arrhythmia, High cholesterol, and prior findings of elevated blood pressure and an abdominal bruit who presents for 3 week follow up of his blood pressure following initiation of low dose HCTZ.  He has not had any complaints over the last three weeks, and after reviewing prior EKGs and his records, his arrhythmia that I had observed at his last visit has not changed, and has already resulted in an otherwise negative work up, i.e. negative stress echo. He states that he has only missed one dose of his blood pressure medication, though it was clear that he was not entirely sure which medication was which or how to take them.  He did confirm that he had been taking all of his medications. His B12 level returned normal, and he continues to deny chest pain, melena, hematemesis, fatigue, or heart burn.  Current Allergies (reviewed today): No known allergies  Updated/Current Medications (including changes made in today's visit):  METOPROLOL TARTRATE 50 MG TABS (METOPROLOL TARTRATE) Take one tablet by mouth twice a day PRILOSEC OTC 20 MG TBEC (OMEPRAZOLE MAGNESIUM) Take 1 tablet by mouth once a day HYDROCHLOROTHIAZIDE 25 MG  TABS (HYDROCHLOROTHIAZIDE) Take 1 tablet by mouth once a day SIMVASTATIN 20 MG  TABS (SIMVASTATIN) Take 1 tablet by mouth once a  day VITAMIN B-12 2000 MCG  TBCR (CYANOCOBALAMIN) Take 1 tablet by mouth once a day for three months     Risk Factors: Tobacco use:  current    Year started:  OVER 30 YEARS    Cigarettes:  Yes -- 1 pack(s) per day Drug use:  no Alcohol use:  no   Review of Systems  CV      Denies chest pain or discomfort, difficulty breathing at night, fainting, lightheadness, palpitations, shortness of breath with exertion, swelling of feet, and swelling of hands.  Resp      Denies cough, morning headaches, and shortness of breath.   Physical Exam  General:     alert and well-developed.  alert and well-developed.   Head:     normocephalic and atraumatic.  normocephalic and atraumatic.   Eyes:     vision grossly intact, pupils equal, pupils round, and pupils reactive to light.  vision grossly intact, pupils equal, pupils round, and pupils reactive to light.   Mouth:     fair dentition.  fair dentition.   Lungs:     normal respiratory effort, normal breath sounds, no crackles, and no wheezes.  normal respiratory effort, normal breath sounds, no crackles, and no wheezes.   Heart:     Irregularly irregular, with runs of normal rhythm interspersed with missed beats, and what sounds  like additional beats, but never at a fast rhythm.  No murmurs. Abdomen:     soft, non-tender, and normal bowel sounds.  soft, non-tender, and normal bowel sounds.   Neurologic:     alert & oriented X3.  alert & oriented X3.   Skin:     turgor normal, color normal, and no rashes.  turgor normal, color normal, and no rashes.   Psych:     Oriented X3, memory intact for recent and remote, normally interactive, good eye contact, not anxious appearing, and not depressed appearing.  Oriented X3, memory intact for recent and remote, normally interactive, good eye contact, not anxious appearing, and not depressed appearing.      Impression & Recommendations:  Problem # 1:  HYPERTENSION (ICD-401.9) Mr. Oldaker blood  pressure is relatively unchanged from where it was previously, which I find disconcerting.  He states he is taking his medication however, having missed only one dose in the last three weeks.  I will go ahead and obtain his scheduled Bmet today, and increase his HCTZ to 25 mg by mouth once daily, although when rechecked after he resting his bp was in the 132/67, because my targetis below 130.   We will have him return to the clinic in one month for another blood pressure check and to recheck a bmet as we are increasing his HCTZ. His updated medication list for this problem includes:    Metoprolol Tartrate 50 Mg Tabs (Metoprolol tartrate) .Marland Kitchen... Take one tablet by mouth twice a day    Hydrochlorothiazide 25 Mg Tabs (Hydrochlorothiazide) .Marland Kitchen... Take 1 tablet by mouth once a day  BP today: 159/67, when rechecked at the end of the visit after resting for 15 minutes, it was 132/67.   Prior BP: 155/81 (06/21/2007)  Labs Reviewed: Creat: 0.92 (06/21/2007) Chol: 164 (06/09/2007)   HDL: 39 (06/09/2007)   LDL: 108 (06/09/2007)   TG: 84 (06/09/2007)  Future Orders: T-Basic Metabolic Panel [id number]) ... 08/12/2007   Problem # 2:  HYPERLIPIDEMIA (ICD-272.4) He states that he has no financial issues obtaining simvastatin, so we will continue the medication with a new FLP in 6 months, along with a Cmet.  My target for him is less than 100 with an HDL above 40, which he was already close to before initiating the medication. His updated medication list for this problem includes:    Simvastatin 20 Mg Tabs (Simvastatin) .Marland Kitchen... 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   If his B12 levels and anemia both remain resolved at his next visit, he will be able to discontinue the b12 supplementation, though I would still favor a multivitamin containing B12. The following medications were removed from the medication list:    Cyanocobalamin 1000 Mcg/ml Soln (Cyanocobalamin) ..... Monthly  His updated medication list for this problem includes:    Vitamin B-12 2000 Mcg Tbcr (Cyanocobalamin) .Marland Kitchen... Take 1 tablet by mouth once a day for three months  Future Orders: T-CBC No Diff ([id number]) ... 10/12/2007 Hgb: 14.5 (05/25/2007)   Hct: 43.6 (05/25/2007)   RDW: 13.4 (05/25/2007)   MCV: 98.0 (05/25/2007)   MCHC: 33.3 (  05/25/2007) B12: 443 (06/21/2007)    Future Orders: T-CBC No Diff ([id number]) ... 10/12/2007   Problem # 4:  B12 DEFICIENCY (ICD-266.2) I am starting oral B12 supplementation and will recheck his B12 levels in 3 months.  His levels had increased to WNL with administration of 6 months of injections, and I would like to see them continue to rise with oral supplementation.  If his B12 levels have risen in three months, i will d/c the oral supplements, while favoring initiation of a MVI containing B12. Future Orders: T-Vitamin B12 ([id number]) ... 10/12/2007 T-CBC No Diff ([id number]) ... 10/12/2007   Problem # 5:  Preventive Health Care (ICD-V70.0) Will address at my next visit with Mr. Trulson, which if I am unable to be the physician to see him in August, should be in October.  Medications Added to Medication List This Visit: 1)  Hydrochlorothiazide 25 Mg Tabs (Hydrochlorothiazide) .... Take 1 tablet by mouth once a day 2)  Vitamin B-12 2000 Mcg Tbcr (Cyanocobalamin) .... Take 1 tablet by mouth once a day for three months   Patient Instructions: 1)  Please return to the clinic in one month.  At that time we will recheck your blood pressure and you potassium and kidney function to make sure there are no problems with the medications. 2)  We will  increase you HCTZ, one of your blood pressure medications.  Take this once a day in the morning, and take your metoprolol twice daily. 3)  I am adding vitamin B12 to be taken by mouth once daily.  We will recheck your blood levels and vitamin B12 levels in three months.    Prescriptions: VITAMIN B-12 2000 MCG  TBCR (CYANOCOBALAMIN) Take 1 tablet by mouth once a day for three months  #90 x 0   Entered and Authorized by:   Valetta Close MD   Signed by:   Valetta Close MD on 07/12/2007   Method used:   Electronically sent to ...       [facility] [address]*       [address]       [place], [place]  [address]       Ph:        Fax:    RxID:   [id number] HYDROCHLOROTHIAZIDE 25 MG  TABS (HYDROCHLOROTHIAZIDE) Take 1 tablet by mouth once a day  #90 x 3   Entered and Authorized by:   Valetta Close MD   Signed by:   Valetta Close MD on 07/12/2007   Method used:   Electronically sent to ...       [facility] [address]       [address]       [place], [place]  [address]       Ph:        Fax:    RxID:   [id number]

## 2011-01-22 NOTE — Miscellaneous (Signed)
Summary: HIPAA Restrictions  HIPAA Restrictions   Imported By: Florinda Marker 08/13/2009 14:23:07  _____________________________________________________________________  External Attachment:    Type:   Image     Comment:   External Document
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
## 2011-01-22 NOTE — Progress Notes (Signed)
Summary: refill/gg  Phone Note Refill Request  on November 07, 2008 4:42 PM  Refills Requested: Medication #1:  METOPROLOL TARTRATE 50 MG TABS Take one tablet by mouth twice a day   Dosage confirmed as above?Dosage Confirmed   Brand Name Necessary? No   Supply Requested: 1 year   Last Refilled: 04/25/2008  Method Requested: Electronic Initial call taken by: Merrie Roof RN,  November 07, 2008 4:42 PM  Follow-up for Phone Call       Follow-up by: Valetta Close MD,  November 07, 2008 8:54 PM      Prescriptions: SIMVASTATIN 20 MG  TABS (SIMVASTATIN) Take 1 tablet by mouth once a day  #90 x 6   Entered and Authorized by:   Valetta Close MD   Signed by:   Valetta Close MD on 11/07/2008   Method used:   Electronically to        [facility]* (retail)       [address] [place] [address]       [place], [place]  [address]       Ph: [phone]       Fax: [phone]   RxID:   [id number] HYDROCHLOROTHIAZIDE 25 MG  TABS (HYDROCHLOROTHIAZIDE) Take 1 tablet by mouth once a day  #90 x 6   Entered and Authorized by:   Valetta Close MD   Signed by:   Valetta Close MD on 11/07/2008   Method used:   Electronically to        [facility]* (retail)       [address]       [place], [place]  [address]       Ph: [phone]       Fax: [phone]   RxID:   [id number] METOPROLOL TARTRATE 50 MG TABS (METOPROLOL TARTRATE) Take one tablet by mouth twice a day  #186 x 6   Entered and Authorized by:   Valetta Close MD   Signed by:   Valetta Close MD on 11/07/2008   Method used:   Electronically to        [facility]* (retail)       [address]       [place], [place]  [address]       Ph: [phone]       Fax: [phone]   RxID:   [id number]
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
## 2011-01-22 NOTE — Discharge Summary (Signed)
Summary: Hospital Discharge Update    Hospital Discharge Update:  Date of Admission: 07/17/2010 Date of Discharge: 07/19/2010  Brief Summary:  Pt was admitted to the hospital for hyponatremia likely due to dehydration and medication. On admission Na 116. During hospitalization he was given hypertonic saline solution, and his hyponatremia resolved and will be D/C home. Will stop his spironolactone and enalapril for soft BP. He will need f/u at outpatient clinic next week and check BMET and BP.    Labs needed at follow-up: Basic metabolic panel  Other follow-up issues:  He will have an appointment on 08/04/2010 with his Cardiologist Dr. Shirlee Latch on 08/04/2010 at 2:30PM to manage his heart disorders.  He will need an appointmen at outpatient clinic next week and check BMET and BP. We have stopped his spironolactone and enalapril for soft BP on discharge.  Problem list changes:  Added new problem of HYPONATREMIA (ICD-276.1)  Medication list changes:  Removed medication of SPIRONOLACTONE 25 MG TABS (SPIRONOLACTONE) Take one-half tablet  by mouth once a day. Removed medication of ENALAPRIL MALEATE 10 MG TABS (ENALAPRIL MALEATE) one-half  tablet twice a day  The medication, problem, and allergy lists have been updated.  Please see the dictated discharge summary for details.  Discharge medications:  SIMVASTATIN 20 MG  TABS (SIMVASTATIN) Take 1 tablet by mouth once a day PANTOPRAZOLE SODIUM 40 MG TBEC (PANTOPRAZOLE SODIUM) Please take 40mg  (1 tab) by mouth twice daily. AMIODARONE HCL 200 MG TABS (AMIODARONE HCL) one daily DIGOXIN 0.125 MG TABS (DIGOXIN) Take 1 tablet (0.125 mg) by mouth once a day. FUROSEMIDE 20 MG TABS (FUROSEMIDE) Take 1 tablet (20 mg) by mouth once a day. METOPROLOL SUCCINATE 50 MG XR24H-TAB (METOPROLOL SUCCINATE) Take one and one-half tablets (75mg ) by mouth once a day. PRADAXA 150 MG CAPS (DABIGATRAN ETEXILATE MESYLATE) Take 1 Capsule two times a day NICOTINE 14 MG/24HR  PT24 (NICOTINE) One every 24 hours  Other patient instructions:  You will have an appointment on 08/04/2010 with his Cardiologist Dr. Shirlee Latch on 08/04/2010 at 2:30PM.  You will need to call for an appointmen at outpatient clinic next week at [phone].   Note: Hospital Discharge Medications & Other Instructions handout was printed, one copy for patient and a second copy to be placed in hospital chart.

## 2011-01-22 NOTE — Letter (Signed)
Summary: EGD Instructions   Gastroenterology  [address] [place], [place] [address]   Phone: [phone]  Fax: [phone]       Daniel [name]    [birth year]    MRN: [id number]       Procedure Day /Date:10/30/10  [name]     Arrival Time: 715 am     Procedure Time:815 am     Location of Procedure:                     [facility] ( Outpatient Registration)   PREPARATION FOR ENDOSCOPY   On 10/30/10 THE DAY OF THE PROCEDURE:  1.   No solid foods, milk or milk products are allowed after midnight the night before your procedure.  2.   Do not drink anything colored red or purple.  Avoid juices with pulp.  No orange juice.  3.  You may drink clear liquids until 415 am , which is 4 hours before your procedure.                                                                                                CLEAR LIQUIDS INCLUDE: Water Jello Ice Popsicles Tea (sugar ok, no milk/cream) Powdered fruit flavored drinks Coffee (sugar ok, no milk/cream) Gatorade Juice: apple, white grape, white cranberry  Lemonade Clear bullion, consomm, broth Carbonated beverages (any kind) Strained chicken noodle soup Hard Candy   MEDICATION INSTRUCTIONS  Unless otherwise instructed, you should take regular prescription medications with a small sip of water as early as possible the morning of your procedure.      Additional medication instructions:  You will be contacted by our office prior to your procedure for directions on holding your Pradaxa.  If you do not hear from our office 1 week prior to your scheduled procedure, please call [phone] to discuss.             OTHER INSTRUCTIONS  You will need a responsible adult at least 70 years of age to accompany you and drive you home.   This person must remain in the waiting room during your procedure.  Wear loose fitting clothing that is easily removed.  Leave jewelry and other valuables at home.  However,  you may wish to bring a book to read or an iPod/MP3 player to listen to music as you wait for your procedure to start.  Remove all body piercing jewelry and leave at home.  Total time from sign-in until discharge is approximately 2-3 hours.  You should go home directly after your procedure and rest.  You can resume normal activities the day after your procedure.  The day of your procedure you should not:   Drive   Make legal decisions   Operate machinery   Drink alcohol   Return to work  You will receive specific instructions about eating, activities and medications before you leave.    The above instructions have been reviewed and explained to me by   _______________________    I fully understand and can verbalize these instructions _____________________________ Date _________

## 2011-01-22 NOTE — Progress Notes (Signed)
Summary: refill/ hla  Phone Note Refill Request Message from:  Fax from Pharmacy on March 29, 2007 2:11 PM  Refills Requested: Medication #1:  lopressor 50mg  take 1 tablet by mouth twice daiy Initial call taken by: Marin Roberts RN,  March 29, 2007 2:14 PM  Follow-up for Phone Call        Refill approved-nurse to complete Follow-up by: Manning Charity MD,  March 29, 2007 3:26 PM  Additional Follow-up for Phone Call Additional follow up Details #1::        Rx faxed to pharmacy Additional Follow-up by: Marin Roberts RN,  March 29, 2007 4:00 PM  New/Updated Medications: METOPROLOL TARTRATE 50 MG TABS (METOPROLOL TARTRATE) Take one tablet by mouth twice a day  New/Updated Medications: METOPROLOL TARTRATE 50 MG TABS (METOPROLOL TARTRATE) Take one tablet by mouth twice a day  Prescriptions: METOPROLOL TARTRATE 50 MG TABS (METOPROLOL TARTRATE) Take one tablet by mouth twice a day  #62 x 5   Entered and Authorized by:   Manning Charity MD   Signed by:   Manning Charity MD on 03/29/2007   Method used:   Telephoned to ...       [facility] [address]       [address]       [place], [place]  [address]  [place]       Ph: [phone]       Fax: [phone]   RxID:   [id number]

## 2011-02-05 ENCOUNTER — Encounter: Payer: Self-pay | Admitting: Ophthalmology

## 2011-02-05 ENCOUNTER — Ambulatory Visit (INDEPENDENT_AMBULATORY_CARE_PROVIDER_SITE_OTHER): Payer: Medicare Other | Admitting: Ophthalmology

## 2011-02-05 DIAGNOSIS — H538 Other visual disturbances: Secondary | ICD-10-CM

## 2011-02-05 DIAGNOSIS — I4891 Unspecified atrial fibrillation: Secondary | ICD-10-CM

## 2011-02-05 DIAGNOSIS — D214 Benign neoplasm of connective and other soft tissue of abdomen: Secondary | ICD-10-CM

## 2011-02-05 DIAGNOSIS — D371 Neoplasm of uncertain behavior of stomach: Secondary | ICD-10-CM

## 2011-02-05 DIAGNOSIS — D378 Neoplasm of uncertain behavior of other specified digestive organs: Secondary | ICD-10-CM

## 2011-02-05 DIAGNOSIS — J984 Other disorders of lung: Secondary | ICD-10-CM

## 2011-02-05 DIAGNOSIS — I5040 Unspecified combined systolic (congestive) and diastolic (congestive) heart failure: Secondary | ICD-10-CM

## 2011-02-05 DIAGNOSIS — I10 Essential (primary) hypertension: Secondary | ICD-10-CM

## 2011-02-05 MED ORDER — FUROSEMIDE 20 MG PO TABS: 20.0000 mg | ORAL_TABLET | Freq: Every day | ORAL | Status: DC

## 2011-02-05 NOTE — Assessment & Plan Note (Signed)
Currently, the patient denies any significant lower extremity edema or shortness of breath. The patient is also not on a diuretic. I will however, start the patient on Lasix given his elevated blood pressure today. At this point in time, this appears well controlled we will continue to monitor.

## 2011-02-05 NOTE — Assessment & Plan Note (Signed)
The patient will need a repeat CT scan in about 3 months.

## 2011-02-05 NOTE — Assessment & Plan Note (Signed)
The patient has marked hypertension today , and though he does not appear significantly volume overloaded on exam , I do believe that given his history of CHF , Lasix may help to reduce his BP significantly.  The patient is able to take his BP at home and I instructed him to do so daily and contact the clinic if his SBP is less than  100 or >180 once he starts his lasix.  I will continue patient on his current dose of enalapril.

## 2011-02-05 NOTE — Assessment & Plan Note (Signed)
This  Been a  concern of the patient's of the last few visits.  Though amiodarone can cause visual distortion I would not expect this to occur in one eye. The patient does have a cataract in that right eye and has had an extraction on the left eye. The patient is scheduled to follow up with ophthalmology next week.

## 2011-02-05 NOTE — Assessment & Plan Note (Signed)
The patient's heart rate is currently regular with a normal S1 and S2 without gallop. The patient's heart rate is 60 today, I feel like this is good control at this time. He'll continue the patient on pradaxa as well as his amiodarone.  The patient is concerned about blurriness in his right eye which could potentially be caused by amiodarone however I think that the patient's cataract is a more likely etiology.
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
## 2011-02-05 NOTE — Patient Instructions (Signed)
Your blood pressure was high today in the office. I'm starting a new medication called Lasix. I would like to see you again in 2 weeks to followup on your blood pressure.

## 2011-02-05 NOTE — Assessment & Plan Note (Signed)
This is currently being followed by Dr. Christella Hartigan however his office has had difficulty stay in contact with Mr. Greenough. As a result, I will re-refer the patient to see Dr. Christella Hartigan  As he feels necessary for followup of this GI tumor.

## 2011-02-11 ENCOUNTER — Telehealth: Payer: Self-pay | Admitting: *Deleted

## 2011-02-11 NOTE — Telephone Encounter (Signed)
Call made to initiate prior authorization.  Pt was on coumadin in the past, but is now only taking the Pradaxa.  Medicare will try to expedite request by having someone to take a look at the request and notify our office of the approval/denial within the next 24 hours.

## 2011-02-11 NOTE — Telephone Encounter (Signed)
Received call from Medicare that Pradaxa has been approved until 12/20/2021. [facility] notified and Medicare stated they will contact pt directly with the approval notice. Phone call complete.

## 2011-02-16 ENCOUNTER — Encounter: Payer: Self-pay | Admitting: Ophthalmology

## 2011-02-17 ENCOUNTER — Encounter: Payer: Self-pay | Admitting: Ophthalmology

## 2011-02-17 ENCOUNTER — Ambulatory Visit (INDEPENDENT_AMBULATORY_CARE_PROVIDER_SITE_OTHER): Payer: Medicare Other | Admitting: Ophthalmology

## 2011-02-17 VITALS — BP 178/80 | HR 60 | Temp 96.9°F | Ht 69.0 in | Wt 116.7 lb

## 2011-02-17 DIAGNOSIS — F17200 Nicotine dependence, unspecified, uncomplicated: Secondary | ICD-10-CM

## 2011-02-17 DIAGNOSIS — H538 Other visual disturbances: Secondary | ICD-10-CM

## 2011-02-17 DIAGNOSIS — I10 Essential (primary) hypertension: Secondary | ICD-10-CM

## 2011-02-17 LAB — BASIC METABOLIC PANEL
BUN: 12 mg/dL (ref 6–23)
CO2: 31 mEq/L (ref 19–32)
Calcium: 10.4 mg/dL (ref 8.4–10.5)
Chloride: 101 mEq/L (ref 96–112)
Creat: 1.35 mg/dL (ref 0.40–1.50)
Glucose, Bld: 94 mg/dL (ref 70–99)
Potassium: 4.1 mEq/L (ref 3.5–5.3)
Sodium: 141 mEq/L (ref 135–145)
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
## 2011-02-17 NOTE — Assessment & Plan Note (Signed)
The patients BP here continues to be elevated here but his home BP's which he has been taking daily are within a reasonable range.  The patient has no edema today and has lost 4 pounds since his last visit (as such lasix appears to be improving his volume status).  I will check a BMET today given that I just started lasix.  If the pt's BP continues to be elevated >160 at his next visit I would consider adding a new agent.  I did instruct the patient to take his BP daily and if SBP is continuously >150 he should call the clinic for re-evaluation.

## 2011-02-17 NOTE — Assessment & Plan Note (Signed)
The patient plans to quit in the upcomming weeks and was given the phone number of the quit help line.  The pt stated that he would call.  The pt does not want nicotine replacement at this time as he states that he is unable to afford it.

## 2011-02-17 NOTE — Assessment & Plan Note (Signed)
The patient has seen Dr. Abran Duke and is scheduled for cataract extraction on March 9th.

## 2011-02-17 NOTE — Patient Instructions (Signed)
Please continue to check your blood pressure daily,  If your systolic blood pressure continues to be greater than 150, please call the clinic so that we can see you and adjust your blood pressure medication. Please return and 1-1/2 months or sooner if needed.

## 2011-02-23 ENCOUNTER — Telehealth: Payer: Self-pay | Admitting: *Deleted

## 2011-02-23 NOTE — Telephone Encounter (Signed)
PA for Pantoprazole was called; form was faxed by [facility] Friday 02/20/11. Form was completed by Dr Arvilla Market -  faxed this morning; waiting on a response.

## 2011-02-26 NOTE — Telephone Encounter (Signed)
PA for Pantoprazole 40mg  has been approve until Dec. 31,2022;  [facility] called and made awared.Marland Kitchen

## 2011-03-04 ENCOUNTER — Encounter: Payer: Self-pay | Admitting: Internal Medicine

## 2011-03-07 LAB — CARDIAC PANEL(CRET KIN+CKTOT+MB+TROPI)
CK, MB: 2.1 ng/mL (ref 0.3–4.0)
CK, MB: 2.2 ng/mL (ref 0.3–4.0)
Relative Index: INVALID (ref 0.0–2.5)
Relative Index: INVALID (ref 0.0–2.5)
Total CK: 29 U/L (ref 7–232)
Total CK: 35 U/L (ref 7–232)
Troponin I: 0.02 ng/mL (ref 0.00–0.06)
Troponin I: 0.03 ng/mL (ref 0.00–0.06)

## 2011-03-07 LAB — CBC
HCT: 36.3 % — ABNORMAL LOW (ref 39.0–52.0)
HCT: 42.8 % (ref 39.0–52.0)
HCT: 46.6 % (ref 39.0–52.0)
Hemoglobin: 12 g/dL — ABNORMAL LOW (ref 13.0–17.0)
Hemoglobin: 14.1 g/dL (ref 13.0–17.0)
Hemoglobin: 15.7 g/dL (ref 13.0–17.0)
MCH: 32.5 pg (ref 26.0–34.0)
MCH: 32.8 pg (ref 26.0–34.0)
MCH: 33.2 pg (ref 26.0–34.0)
MCHC: 32.9 g/dL (ref 30.0–36.0)
MCHC: 33 g/dL (ref 30.0–36.0)
MCHC: 33.7 g/dL (ref 30.0–36.0)
MCV: 98.6 fL (ref 78.0–100.0)
MCV: 98.8 fL (ref 78.0–100.0)
MCV: 99.3 fL (ref 78.0–100.0)
Platelets: 218 10*3/uL (ref 150–400)
Platelets: 230 10*3/uL (ref 150–400)
Platelets: 268 10*3/uL (ref 150–400)
RBC: 3.65 MIL/uL — ABNORMAL LOW (ref 4.22–5.81)
RBC: 4.33 MIL/uL (ref 4.22–5.81)
RBC: 4.73 MIL/uL (ref 4.22–5.81)
RDW: 12.6 % (ref 11.5–15.5)
RDW: 12.8 % (ref 11.5–15.5)
RDW: 13 % (ref 11.5–15.5)
WBC: 8.6 10*3/uL (ref 4.0–10.5)
WBC: 9.1 10*3/uL (ref 4.0–10.5)
WBC: 9.4 10*3/uL (ref 4.0–10.5)

## 2011-03-07 LAB — COMPREHENSIVE METABOLIC PANEL
ALT: 47 U/L (ref 0–53)
AST: 38 U/L — ABNORMAL HIGH (ref 0–37)
Albumin: 3.9 g/dL (ref 3.5–5.2)
Alkaline Phosphatase: 101 U/L (ref 39–117)
BUN: 20 mg/dL (ref 6–23)
CO2: 25 mEq/L (ref 19–32)
Calcium: 9.7 mg/dL (ref 8.4–10.5)
Chloride: 82 mEq/L — ABNORMAL LOW (ref 96–112)
Creatinine, Ser: 1.53 mg/dL — ABNORMAL HIGH (ref 0.4–1.5)
GFR calc Af Amer: 55 mL/min — ABNORMAL LOW (ref >60)
GFR calc non Af Amer: 45 mL/min — ABNORMAL LOW (ref >60)
Glucose, Bld: 92 mg/dL (ref 70–99)
Potassium: 4.7 mEq/L (ref 3.5–5.1)
Sodium: 116 mEq/L — CL (ref 135–145)
Total Bilirubin: 0.9 mg/dL (ref 0.3–1.2)
Total Protein: 6.7 g/dL (ref 6.0–8.3)

## 2011-03-07 LAB — GIARDIA/CRYPTOSPORIDIUM SCREEN(EIA)
Cryptosporidium Screen (EIA): NEGATIVE
Giardia Screen - EIA: NEGATIVE

## 2011-03-07 LAB — BASIC METABOLIC PANEL
BUN: 10 mg/dL (ref 6–23)
BUN: 14 mg/dL (ref 6–23)
BUN: 17 mg/dL (ref 6–23)
CO2: 28 mEq/L (ref 19–32)
CO2: 28 mEq/L (ref 19–32)
CO2: 30 mEq/L (ref 19–32)
Calcium: 9.1 mg/dL (ref 8.4–10.5)
Calcium: 9.5 mg/dL (ref 8.4–10.5)
Calcium: 9.7 mg/dL (ref 8.4–10.5)
Chloride: 101 mEq/L (ref 96–112)
Chloride: 94 mEq/L — ABNORMAL LOW (ref 96–112)
Chloride: 99 mEq/L (ref 96–112)
Creatinine, Ser: 1 mg/dL (ref 0.4–1.5)
Creatinine, Ser: 1.02 mg/dL (ref 0.4–1.5)
Creatinine, Ser: 1.3 mg/dL (ref 0.4–1.5)
GFR calc Af Amer: >60 mL/min (ref >60)
GFR calc Af Amer: >60 mL/min (ref >60)
GFR calc Af Amer: >60 mL/min (ref >60)
GFR calc non Af Amer: 55 mL/min — ABNORMAL LOW (ref >60)
GFR calc non Af Amer: >60 mL/min (ref >60)
GFR calc non Af Amer: >60 mL/min (ref >60)
Glucose, Bld: 83 mg/dL (ref 70–99)
Glucose, Bld: 85 mg/dL (ref 70–99)
Glucose, Bld: 91 mg/dL (ref 70–99)
Potassium: 5 mEq/L (ref 3.5–5.1)
Potassium: 5.1 mEq/L (ref 3.5–5.1)
Potassium: 5.4 mEq/L — ABNORMAL HIGH (ref 3.5–5.1)
Sodium: 127 mEq/L — ABNORMAL LOW (ref 135–145)
Sodium: 133 mEq/L — ABNORMAL LOW (ref 135–145)
Sodium: 134 mEq/L — ABNORMAL LOW (ref 135–145)

## 2011-03-07 LAB — DIGOXIN LEVEL
Digoxin Level: 1.1 ng/mL (ref 0.8–2.0)
Digoxin Level: 1.9 ng/mL (ref 0.8–2.0)
Digoxin Level: 2.3 ng/mL — ABNORMAL HIGH (ref 0.8–2.0)

## 2011-03-07 LAB — TROPONIN I: Troponin I: 0.02 ng/mL (ref 0.00–0.06)

## 2011-03-07 LAB — DIFFERENTIAL
Basophils Absolute: 0.1 10*3/uL (ref 0.0–0.1)
Basophils Relative: 1 % (ref 0–1)
Eosinophils Absolute: 0 10*3/uL (ref 0.0–0.7)
Eosinophils Relative: 0 % (ref 0–5)
Lymphocytes Relative: 16 % (ref 12–46)
Lymphs Abs: 1.4 10*3/uL (ref 0.7–4.0)
Monocytes Absolute: 0.8 10*3/uL (ref 0.1–1.0)
Monocytes Relative: 9 % (ref 3–12)
Neutro Abs: 6.3 10*3/uL (ref 1.7–7.7)
Neutrophils Relative %: 73 % (ref 43–77)

## 2011-03-07 LAB — MAGNESIUM
Magnesium: 2.2 mg/dL (ref 1.5–2.5)
Magnesium: 2.3 mg/dL (ref 1.5–2.5)

## 2011-03-07 LAB — TSH: TSH: 1.154 u[IU]/mL (ref 0.350–4.500)

## 2011-03-07 LAB — CK TOTAL AND CKMB (NOT AT ARMC)
CK, MB: 2.1 ng/mL (ref 0.3–4.0)
Relative Index: INVALID (ref 0.0–2.5)
Total CK: 35 U/L (ref 7–232)

## 2011-03-07 LAB — SODIUM, URINE, RANDOM: Sodium, Ur: <10 mEq/L

## 2011-03-07 LAB — URIC ACID: Uric Acid, Serum: 4.6 mg/dL (ref 4.0–7.8)

## 2011-03-07 LAB — URINE CULTURE
Colony Count: NO GROWTH
Culture: NO GROWTH

## 2011-03-07 LAB — CREATININE, URINE, RANDOM: Creatinine, Urine: 39.3 mg/dL

## 2011-03-07 LAB — OSMOLALITY: Osmolality: 257 mOsm/kg — ABNORMAL LOW (ref 275–300)

## 2011-03-07 LAB — BRAIN NATRIURETIC PEPTIDE: Pro B Natriuretic peptide (BNP): <30 pg/mL (ref 0.0–100.0)

## 2011-03-07 LAB — OSMOLALITY, URINE: Osmolality, Ur: 137 mOsm/kg — ABNORMAL LOW (ref 390–1090)

## 2011-03-08 LAB — DIGOXIN LEVEL: Digoxin Level: 0.7 ng/mL — ABNORMAL LOW (ref 0.8–2.0)

## 2011-03-08 LAB — COMPREHENSIVE METABOLIC PANEL
ALT: 91 U/L — ABNORMAL HIGH (ref 0–53)
AST: 59 U/L — ABNORMAL HIGH (ref 0–37)
Albumin: 2.6 g/dL — ABNORMAL LOW (ref 3.5–5.2)
Alkaline Phosphatase: 90 U/L (ref 39–117)
BUN: 10 mg/dL (ref 6–23)
CO2: 27 mEq/L (ref 19–32)
Calcium: 9.3 mg/dL (ref 8.4–10.5)
Chloride: 105 mEq/L (ref 96–112)
Creatinine, Ser: 0.87 mg/dL (ref 0.4–1.5)
GFR calc Af Amer: >60 mL/min (ref >60)
GFR calc non Af Amer: >60 mL/min (ref >60)
Glucose, Bld: 100 mg/dL — ABNORMAL HIGH (ref 70–99)
Potassium: 4.3 mEq/L (ref 3.5–5.1)
Sodium: 137 mEq/L (ref 135–145)
Total Bilirubin: 0.6 mg/dL (ref 0.3–1.2)
Total Protein: 5 g/dL — ABNORMAL LOW (ref 6.0–8.3)

## 2011-03-08 LAB — CBC
HCT: 40.8 % (ref 39.0–52.0)
HCT: 45.2 % (ref 39.0–52.0)
Hemoglobin: 13.8 g/dL (ref 13.0–17.0)
Hemoglobin: 14.8 g/dL (ref 13.0–17.0)
MCHC: 32.8 g/dL (ref 30.0–36.0)
MCHC: 33.9 g/dL (ref 30.0–36.0)
MCV: 102.7 fL — ABNORMAL HIGH (ref 78.0–100.0)
MCV: 103.9 fL — ABNORMAL HIGH (ref 78.0–100.0)
Platelets: 165 10*3/uL (ref 150–400)
Platelets: 197 10*3/uL (ref 150–400)
RBC: 3.98 MIL/uL — ABNORMAL LOW (ref 4.22–5.81)
RBC: 4.35 MIL/uL (ref 4.22–5.81)
RDW: 14.1 % (ref 11.5–15.5)
RDW: 14.1 % (ref 11.5–15.5)
WBC: 6.4 10*3/uL (ref 4.0–10.5)
WBC: 7.9 10*3/uL (ref 4.0–10.5)

## 2011-03-08 LAB — PROTIME-INR
INR: 2.15 — ABNORMAL HIGH (ref 0.00–1.49)
INR: 2.36 — ABNORMAL HIGH (ref 0.00–1.49)
Prothrombin Time: 23.8 seconds — ABNORMAL HIGH (ref 11.6–15.2)
Prothrombin Time: 25.6 seconds — ABNORMAL HIGH (ref 11.6–15.2)

## 2011-03-08 LAB — BASIC METABOLIC PANEL
BUN: 12 mg/dL (ref 6–23)
CO2: 28 mEq/L (ref 19–32)
Calcium: 9.5 mg/dL (ref 8.4–10.5)
Chloride: 105 mEq/L (ref 96–112)
Creatinine, Ser: 0.9 mg/dL (ref 0.4–1.5)
GFR calc Af Amer: >60 mL/min (ref >60)
GFR calc non Af Amer: >60 mL/min (ref >60)
Glucose, Bld: 94 mg/dL (ref 70–99)
Potassium: 4.6 mEq/L (ref 3.5–5.1)
Sodium: 136 mEq/L (ref 135–145)

## 2011-03-08 LAB — HEPARIN LEVEL (UNFRACTIONATED)
Heparin Unfractionated: 0.54 IU/mL (ref 0.30–0.70)
Heparin Unfractionated: <0.1 IU/mL — ABNORMAL LOW (ref 0.30–0.70)

## 2011-03-08 LAB — APTT: aPTT: 34 seconds (ref 24–37)

## 2011-03-09 LAB — BASIC METABOLIC PANEL
BUN: 16 mg/dL (ref 6–23)
BUN: 17 mg/dL (ref 6–23)
BUN: 21 mg/dL (ref 6–23)
BUN: 22 mg/dL (ref 6–23)
BUN: 9 mg/dL (ref 6–23)
CO2: 18 mEq/L — ABNORMAL LOW (ref 19–32)
CO2: 26 mEq/L (ref 19–32)
CO2: 28 mEq/L (ref 19–32)
CO2: 29 mEq/L (ref 19–32)
CO2: 31 mEq/L (ref 19–32)
Calcium: 9.4 mg/dL (ref 8.4–10.5)
Calcium: 9.4 mg/dL (ref 8.4–10.5)
Calcium: 9.6 mg/dL (ref 8.4–10.5)
Calcium: 9.6 mg/dL (ref 8.4–10.5)
Calcium: 9.8 mg/dL (ref 8.4–10.5)
Chloride: 101 mEq/L (ref 96–112)
Chloride: 101 mEq/L (ref 96–112)
Chloride: 102 mEq/L (ref 96–112)
Chloride: 105 mEq/L (ref 96–112)
Chloride: 106 mEq/L (ref 96–112)
Creatinine, Ser: 0.91 mg/dL (ref 0.4–1.5)
Creatinine, Ser: 0.91 mg/dL (ref 0.4–1.5)
Creatinine, Ser: 1.24 mg/dL (ref 0.4–1.5)
Creatinine, Ser: 1.49 mg/dL (ref 0.4–1.5)
Creatinine, Ser: 1.52 mg/dL — ABNORMAL HIGH (ref 0.4–1.5)
GFR calc Af Amer: 55 mL/min — ABNORMAL LOW (ref >60)
GFR calc Af Amer: 57 mL/min — ABNORMAL LOW (ref >60)
GFR calc Af Amer: >60 mL/min (ref >60)
GFR calc Af Amer: >60 mL/min (ref >60)
GFR calc Af Amer: >60 mL/min (ref >60)
GFR calc non Af Amer: 46 mL/min — ABNORMAL LOW (ref >60)
GFR calc non Af Amer: 47 mL/min — ABNORMAL LOW (ref >60)
GFR calc non Af Amer: 58 mL/min — ABNORMAL LOW (ref >60)
GFR calc non Af Amer: >60 mL/min (ref >60)
GFR calc non Af Amer: >60 mL/min (ref >60)
Glucose, Bld: 105 mg/dL — ABNORMAL HIGH (ref 70–99)
Glucose, Bld: 116 mg/dL — ABNORMAL HIGH (ref 70–99)
Glucose, Bld: 66 mg/dL — ABNORMAL LOW (ref 70–99)
Glucose, Bld: 83 mg/dL (ref 70–99)
Glucose, Bld: 92 mg/dL (ref 70–99)
Potassium: 3.5 mEq/L (ref 3.5–5.1)
Potassium: 3.8 mEq/L (ref 3.5–5.1)
Potassium: 3.8 mEq/L (ref 3.5–5.1)
Potassium: 4.5 mEq/L (ref 3.5–5.1)
Potassium: 5.1 mEq/L (ref 3.5–5.1)
Sodium: 136 mEq/L (ref 135–145)
Sodium: 137 mEq/L (ref 135–145)
Sodium: 137 mEq/L (ref 135–145)
Sodium: 137 mEq/L (ref 135–145)
Sodium: 140 mEq/L (ref 135–145)

## 2011-03-09 LAB — HEPATIC FUNCTION PANEL
ALT: 162 U/L — ABNORMAL HIGH (ref 0–53)
ALT: 365 U/L — ABNORMAL HIGH (ref 0–53)
AST: 229 U/L — ABNORMAL HIGH (ref 0–37)
AST: 417 U/L — ABNORMAL HIGH (ref 0–37)
Albumin: 3.2 g/dL — ABNORMAL LOW (ref 3.5–5.2)
Albumin: 3.4 g/dL — ABNORMAL LOW (ref 3.5–5.2)
Alkaline Phosphatase: 100 U/L (ref 39–117)
Alkaline Phosphatase: 96 U/L (ref 39–117)
Bilirubin, Direct: 0.3 mg/dL (ref 0.0–0.3)
Bilirubin, Direct: 0.5 mg/dL — ABNORMAL HIGH (ref 0.0–0.3)
Indirect Bilirubin: 0.8 mg/dL (ref 0.3–0.9)
Indirect Bilirubin: 0.9 mg/dL (ref 0.3–0.9)
Total Bilirubin: 1.1 mg/dL (ref 0.3–1.2)
Total Bilirubin: 1.4 mg/dL — ABNORMAL HIGH (ref 0.3–1.2)
Total Protein: 5.4 g/dL — ABNORMAL LOW (ref 6.0–8.3)
Total Protein: 6.1 g/dL (ref 6.0–8.3)

## 2011-03-09 LAB — DIFFERENTIAL
Basophils Absolute: 0 10*3/uL (ref 0.0–0.1)
Basophils Absolute: 0 10*3/uL (ref 0.0–0.1)
Basophils Absolute: 0 10*3/uL (ref 0.0–0.1)
Basophils Relative: 0 % (ref 0–1)
Basophils Relative: 0 % (ref 0–1)
Basophils Relative: 1 % (ref 0–1)
Eosinophils Absolute: 0 10*3/uL (ref 0.0–0.7)
Eosinophils Absolute: 0 10*3/uL (ref 0.0–0.7)
Eosinophils Absolute: 0 10*3/uL (ref 0.0–0.7)
Eosinophils Relative: 0 % (ref 0–5)
Eosinophils Relative: 0 % (ref 0–5)
Eosinophils Relative: 1 % (ref 0–5)
Lymphocytes Relative: 12 % (ref 12–46)
Lymphocytes Relative: 14 % (ref 12–46)
Lymphocytes Relative: 27 % (ref 12–46)
Lymphs Abs: 1.5 10*3/uL (ref 0.7–4.0)
Lymphs Abs: 1.6 10*3/uL (ref 0.7–4.0)
Lymphs Abs: 2.3 10*3/uL (ref 0.7–4.0)
Monocytes Absolute: 0.6 10*3/uL (ref 0.1–1.0)
Monocytes Absolute: 1 10*3/uL (ref 0.1–1.0)
Monocytes Absolute: 1.2 10*3/uL — ABNORMAL HIGH (ref 0.1–1.0)
Monocytes Relative: 10 % (ref 3–12)
Monocytes Relative: 8 % (ref 3–12)
Monocytes Relative: 8 % (ref 3–12)
Neutro Abs: 5.5 10*3/uL (ref 1.7–7.7)
Neutro Abs: 8.6 10*3/uL — ABNORMAL HIGH (ref 1.7–7.7)
Neutro Abs: 9.6 10*3/uL — ABNORMAL HIGH (ref 1.7–7.7)
Neutrophils Relative %: 65 % (ref 43–77)
Neutrophils Relative %: 76 % (ref 43–77)
Neutrophils Relative %: 79 % — ABNORMAL HIGH (ref 43–77)

## 2011-03-09 LAB — COMPREHENSIVE METABOLIC PANEL
ALT: 129 U/L — ABNORMAL HIGH (ref 0–53)
ALT: 216 U/L — ABNORMAL HIGH (ref 0–53)
ALT: 278 U/L — ABNORMAL HIGH (ref 0–53)
ALT: 330 U/L — ABNORMAL HIGH (ref 0–53)
ALT: 74 U/L — ABNORMAL HIGH (ref 0–53)
AST: 104 U/L — ABNORMAL HIGH (ref 0–37)
AST: 182 U/L — ABNORMAL HIGH (ref 0–37)
AST: 474 U/L — ABNORMAL HIGH (ref 0–37)
AST: 65 U/L — ABNORMAL HIGH (ref 0–37)
AST: 82 U/L — ABNORMAL HIGH (ref 0–37)
Albumin: 2.9 g/dL — ABNORMAL LOW (ref 3.5–5.2)
Albumin: 2.9 g/dL — ABNORMAL LOW (ref 3.5–5.2)
Albumin: 3 g/dL — ABNORMAL LOW (ref 3.5–5.2)
Albumin: 3.3 g/dL — ABNORMAL LOW (ref 3.5–5.2)
Albumin: 3.7 g/dL (ref 3.5–5.2)
Alkaline Phosphatase: 100 U/L (ref 39–117)
Alkaline Phosphatase: 100 U/L (ref 39–117)
Alkaline Phosphatase: 105 U/L (ref 39–117)
Alkaline Phosphatase: 91 U/L (ref 39–117)
Alkaline Phosphatase: 97 U/L (ref 39–117)
BUN: 11 mg/dL (ref 6–23)
BUN: 12 mg/dL (ref 6–23)
BUN: 14 mg/dL (ref 6–23)
BUN: 16 mg/dL (ref 6–23)
BUN: 20 mg/dL (ref 6–23)
CO2: 21 mEq/L (ref 19–32)
CO2: 24 mEq/L (ref 19–32)
CO2: 27 mEq/L (ref 19–32)
CO2: 28 mEq/L (ref 19–32)
CO2: 33 mEq/L — ABNORMAL HIGH (ref 19–32)
Calcium: 9.4 mg/dL (ref 8.4–10.5)
Calcium: 9.5 mg/dL (ref 8.4–10.5)
Calcium: 9.7 mg/dL (ref 8.4–10.5)
Calcium: 9.8 mg/dL (ref 8.4–10.5)
Calcium: 9.9 mg/dL (ref 8.4–10.5)
Chloride: 101 mEq/L (ref 96–112)
Chloride: 102 mEq/L (ref 96–112)
Chloride: 102 mEq/L (ref 96–112)
Chloride: 103 mEq/L (ref 96–112)
Chloride: 105 mEq/L (ref 96–112)
Creatinine, Ser: 0.8 mg/dL (ref 0.4–1.5)
Creatinine, Ser: 0.93 mg/dL (ref 0.4–1.5)
Creatinine, Ser: 1.05 mg/dL (ref 0.4–1.5)
Creatinine, Ser: 1.08 mg/dL (ref 0.4–1.5)
Creatinine, Ser: 1.28 mg/dL (ref 0.4–1.5)
GFR calc Af Amer: >60 mL/min (ref >60)
GFR calc Af Amer: >60 mL/min (ref >60)
GFR calc Af Amer: >60 mL/min (ref >60)
GFR calc Af Amer: >60 mL/min (ref >60)
GFR calc Af Amer: >60 mL/min (ref >60)
GFR calc non Af Amer: 56 mL/min — ABNORMAL LOW (ref >60)
GFR calc non Af Amer: >60 mL/min (ref >60)
GFR calc non Af Amer: >60 mL/min (ref >60)
GFR calc non Af Amer: >60 mL/min (ref >60)
GFR calc non Af Amer: >60 mL/min (ref >60)
Glucose, Bld: 104 mg/dL — ABNORMAL HIGH (ref 70–99)
Glucose, Bld: 79 mg/dL (ref 70–99)
Glucose, Bld: 86 mg/dL (ref 70–99)
Glucose, Bld: 89 mg/dL (ref 70–99)
Glucose, Bld: 96 mg/dL (ref 70–99)
Potassium: 3.3 mEq/L — ABNORMAL LOW (ref 3.5–5.1)
Potassium: 3.5 mEq/L (ref 3.5–5.1)
Potassium: 3.6 mEq/L (ref 3.5–5.1)
Potassium: 4.1 mEq/L (ref 3.5–5.1)
Potassium: 4.3 mEq/L (ref 3.5–5.1)
Sodium: 133 mEq/L — ABNORMAL LOW (ref 135–145)
Sodium: 138 mEq/L (ref 135–145)
Sodium: 138 mEq/L (ref 135–145)
Sodium: 139 mEq/L (ref 135–145)
Sodium: 140 mEq/L (ref 135–145)
Total Bilirubin: 0.7 mg/dL (ref 0.3–1.2)
Total Bilirubin: 1 mg/dL (ref 0.3–1.2)
Total Bilirubin: 1.1 mg/dL (ref 0.3–1.2)
Total Bilirubin: 1.2 mg/dL (ref 0.3–1.2)
Total Bilirubin: 1.2 mg/dL (ref 0.3–1.2)
Total Protein: 5.4 g/dL — ABNORMAL LOW (ref 6.0–8.3)
Total Protein: 5.5 g/dL — ABNORMAL LOW (ref 6.0–8.3)
Total Protein: 5.6 g/dL — ABNORMAL LOW (ref 6.0–8.3)
Total Protein: 5.7 g/dL — ABNORMAL LOW (ref 6.0–8.3)
Total Protein: 6.6 g/dL (ref 6.0–8.3)

## 2011-03-09 LAB — URINE MICROSCOPIC-ADD ON

## 2011-03-09 LAB — POCT I-STAT 3, VENOUS BLOOD GAS (G3P V)
Acid-Base Excess: 4 mmol/L — ABNORMAL HIGH (ref 0.0–2.0)
Acid-Base Excess: 5 mmol/L — ABNORMAL HIGH (ref 0.0–2.0)
Bicarbonate: 28.8 mEq/L — ABNORMAL HIGH (ref 20.0–24.0)
Bicarbonate: 30.6 mEq/L — ABNORMAL HIGH (ref 20.0–24.0)
O2 Saturation: 64 %
O2 Saturation: 71 %
TCO2: 30 mmol/L (ref 0–100)
TCO2: 32 mmol/L (ref 0–100)
pCO2, Ven: 44.2 mmHg — ABNORMAL LOW (ref 45.0–50.0)
pCO2, Ven: 46.3 mmHg (ref 45.0–50.0)
pH, Ven: 7.422 — ABNORMAL HIGH (ref 7.250–7.300)
pH, Ven: 7.427 — ABNORMAL HIGH (ref 7.250–7.300)
pO2, Ven: 33 mmHg (ref 30.0–45.0)
pO2, Ven: 37 mmHg (ref 30.0–45.0)

## 2011-03-09 LAB — EXPECTORATED SPUTUM ASSESSMENT W GRAM STAIN, RFLX TO RESP C

## 2011-03-09 LAB — CARDIAC PANEL(CRET KIN+CKTOT+MB+TROPI)
CK, MB: 3.3 ng/mL (ref 0.3–4.0)
CK, MB: 3.5 ng/mL (ref 0.3–4.0)
CK, MB: 4.4 ng/mL — ABNORMAL HIGH (ref 0.3–4.0)
CK, MB: 5 ng/mL — ABNORMAL HIGH (ref 0.3–4.0)
CK, MB: 6.6 ng/mL (ref 0.3–4.0)
CK, MB: 6.7 ng/mL (ref 0.3–4.0)
CK, MB: 8 ng/mL (ref 0.3–4.0)
Relative Index: 1.4 (ref 0.0–2.5)
Relative Index: 1.4 (ref 0.0–2.5)
Relative Index: 1.5 (ref 0.0–2.5)
Relative Index: 1.8 (ref 0.0–2.5)
Relative Index: 2.6 — ABNORMAL HIGH (ref 0.0–2.5)
Relative Index: 2.8 — ABNORMAL HIGH (ref 0.0–2.5)
Relative Index: 3.1 — ABNORMAL HIGH (ref 0.0–2.5)
Total CK: 127 U/L (ref 7–232)
Total CK: 128 U/L (ref 7–232)
Total CK: 141 U/L (ref 7–232)
Total CK: 359 U/L — ABNORMAL HIGH (ref 7–232)
Total CK: 364 U/L — ABNORMAL HIGH (ref 7–232)
Total CK: 441 U/L — ABNORMAL HIGH (ref 7–232)
Total CK: 591 U/L — ABNORMAL HIGH (ref 7–232)
Troponin I: 0.04 ng/mL (ref 0.00–0.06)
Troponin I: 0.04 ng/mL (ref 0.00–0.06)
Troponin I: 0.04 ng/mL (ref 0.00–0.06)
Troponin I: 0.04 ng/mL (ref 0.00–0.06)
Troponin I: 0.04 ng/mL (ref 0.00–0.06)
Troponin I: 0.05 ng/mL (ref 0.00–0.06)
Troponin I: 0.05 ng/mL (ref 0.00–0.06)

## 2011-03-09 LAB — PROTIME-INR
INR: 1.23 (ref 0.00–1.49)
INR: 1.31 (ref 0.00–1.49)
INR: 1.35 (ref 0.00–1.49)
INR: 1.49 (ref 0.00–1.49)
INR: 1.72 — ABNORMAL HIGH (ref 0.00–1.49)
Prothrombin Time: 15.4 seconds — ABNORMAL HIGH (ref 11.6–15.2)
Prothrombin Time: 16.2 seconds — ABNORMAL HIGH (ref 11.6–15.2)
Prothrombin Time: 16.6 seconds — ABNORMAL HIGH (ref 11.6–15.2)
Prothrombin Time: 17.9 seconds — ABNORMAL HIGH (ref 11.6–15.2)
Prothrombin Time: 20 seconds — ABNORMAL HIGH (ref 11.6–15.2)

## 2011-03-09 LAB — POCT I-STAT 3, ART BLOOD GAS (G3+)
Acid-Base Excess: 5 mmol/L — ABNORMAL HIGH (ref 0.0–2.0)
Bicarbonate: 28.8 mEq/L — ABNORMAL HIGH (ref 20.0–24.0)
O2 Saturation: 97 %
TCO2: 30 mmol/L (ref 0–100)
pCO2 arterial: 38.3 mmHg (ref 35.0–45.0)
pH, Arterial: 7.483 — ABNORMAL HIGH (ref 7.350–7.450)
pO2, Arterial: 80 mmHg (ref 80.0–100.0)

## 2011-03-09 LAB — CBC
HCT: 40.5 % (ref 39.0–52.0)
HCT: 42.5 % (ref 39.0–52.0)
HCT: 42.6 % (ref 39.0–52.0)
HCT: 43.4 % (ref 39.0–52.0)
HCT: 44.6 % (ref 39.0–52.0)
HCT: 46 % (ref 39.0–52.0)
HCT: 46.7 % (ref 39.0–52.0)
HCT: 47.5 % (ref 39.0–52.0)
HCT: 48.8 % (ref 39.0–52.0)
Hemoglobin: 13.4 g/dL (ref 13.0–17.0)
Hemoglobin: 14.4 g/dL (ref 13.0–17.0)
Hemoglobin: 14.4 g/dL (ref 13.0–17.0)
Hemoglobin: 14.6 g/dL (ref 13.0–17.0)
Hemoglobin: 14.7 g/dL (ref 13.0–17.0)
Hemoglobin: 15.2 g/dL (ref 13.0–17.0)
Hemoglobin: 15.4 g/dL (ref 13.0–17.0)
Hemoglobin: 15.5 g/dL (ref 13.0–17.0)
Hemoglobin: 16.3 g/dL (ref 13.0–17.0)
MCHC: 32.5 g/dL (ref 30.0–36.0)
MCHC: 32.6 g/dL (ref 30.0–36.0)
MCHC: 33 g/dL (ref 30.0–36.0)
MCHC: 33 g/dL (ref 30.0–36.0)
MCHC: 33.2 g/dL (ref 30.0–36.0)
MCHC: 33.3 g/dL (ref 30.0–36.0)
MCHC: 33.7 g/dL (ref 30.0–36.0)
MCHC: 33.9 g/dL (ref 30.0–36.0)
MCHC: 33.9 g/dL (ref 30.0–36.0)
MCV: 102.6 fL — ABNORMAL HIGH (ref 78.0–100.0)
MCV: 103 fL — ABNORMAL HIGH (ref 78.0–100.0)
MCV: 103 fL — ABNORMAL HIGH (ref 78.0–100.0)
MCV: 103.3 fL — ABNORMAL HIGH (ref 78.0–100.0)
MCV: 103.6 fL — ABNORMAL HIGH (ref 78.0–100.0)
MCV: 103.6 fL — ABNORMAL HIGH (ref 78.0–100.0)
MCV: 104 fL — ABNORMAL HIGH (ref 78.0–100.0)
MCV: 104 fL — ABNORMAL HIGH (ref 78.0–100.0)
MCV: 104.2 fL — ABNORMAL HIGH (ref 78.0–100.0)
Platelets: 100 10*3/uL — ABNORMAL LOW (ref 150–400)
Platelets: 112 10*3/uL — ABNORMAL LOW (ref 150–400)
Platelets: 113 10*3/uL — ABNORMAL LOW (ref 150–400)
Platelets: 114 10*3/uL — ABNORMAL LOW (ref 150–400)
Platelets: 134 10*3/uL — ABNORMAL LOW (ref 150–400)
Platelets: 139 10*3/uL — ABNORMAL LOW (ref 150–400)
Platelets: 150 10*3/uL (ref 150–400)
Platelets: 156 10*3/uL (ref 150–400)
Platelets: 163 10*3/uL (ref 150–400)
RBC: 3.93 MIL/uL — ABNORMAL LOW (ref 4.22–5.81)
RBC: 4.08 MIL/uL — ABNORMAL LOW (ref 4.22–5.81)
RBC: 4.15 MIL/uL — ABNORMAL LOW (ref 4.22–5.81)
RBC: 4.21 MIL/uL — ABNORMAL LOW (ref 4.22–5.81)
RBC: 4.31 MIL/uL (ref 4.22–5.81)
RBC: 4.42 MIL/uL (ref 4.22–5.81)
RBC: 4.51 MIL/uL (ref 4.22–5.81)
RBC: 4.57 MIL/uL (ref 4.22–5.81)
RBC: 4.73 MIL/uL (ref 4.22–5.81)
RDW: 13.5 % (ref 11.5–15.5)
RDW: 13.6 % (ref 11.5–15.5)
RDW: 13.7 % (ref 11.5–15.5)
RDW: 13.7 % (ref 11.5–15.5)
RDW: 13.8 % (ref 11.5–15.5)
RDW: 14 % (ref 11.5–15.5)
RDW: 14 % (ref 11.5–15.5)
RDW: 14.4 % (ref 11.5–15.5)
RDW: 14.4 % (ref 11.5–15.5)
WBC: 10.2 10*3/uL (ref 4.0–10.5)
WBC: 11.3 10*3/uL — ABNORMAL HIGH (ref 4.0–10.5)
WBC: 11.8 10*3/uL — ABNORMAL HIGH (ref 4.0–10.5)
WBC: 12 10*3/uL — ABNORMAL HIGH (ref 4.0–10.5)
WBC: 12 10*3/uL — ABNORMAL HIGH (ref 4.0–10.5)
WBC: 12.2 10*3/uL — ABNORMAL HIGH (ref 4.0–10.5)
WBC: 8.3 10*3/uL (ref 4.0–10.5)
WBC: 8.5 10*3/uL (ref 4.0–10.5)
WBC: 9.4 10*3/uL (ref 4.0–10.5)

## 2011-03-09 LAB — CULTURE, BLOOD (ROUTINE X 2)
Culture: NO GROWTH
Culture: NO GROWTH

## 2011-03-09 LAB — URINALYSIS, ROUTINE W REFLEX MICROSCOPIC
Bilirubin Urine: NEGATIVE
Glucose, UA: NEGATIVE mg/dL
Ketones, ur: NEGATIVE mg/dL
Nitrite: NEGATIVE
Protein, ur: NEGATIVE mg/dL
Specific Gravity, Urine: 1.011 (ref 1.005–1.030)
Urobilinogen, UA: 1 mg/dL (ref 0.0–1.0)
pH: 5 (ref 5.0–8.0)

## 2011-03-09 LAB — CLOSTRIDIUM DIFFICILE EIA
C difficile Toxins A+B, EIA: NEGATIVE
C difficile Toxins A+B, EIA: NEGATIVE
C difficile Toxins A+B, EIA: NEGATIVE
C difficile Toxins A+B, EIA: NEGATIVE

## 2011-03-09 LAB — HEPATITIS PANEL, ACUTE
HCV Ab: NEGATIVE
Hep A IgM: NEGATIVE
Hep B C IgM: NEGATIVE
Hepatitis B Surface Ag: NEGATIVE

## 2011-03-09 LAB — CK TOTAL AND CKMB (NOT AT ARMC)
CK, MB: 3.3 ng/mL (ref 0.3–4.0)
Relative Index: 2.4 (ref 0.0–2.5)
Total CK: 137 U/L (ref 7–232)

## 2011-03-09 LAB — HEPARIN LEVEL (UNFRACTIONATED)
Heparin Unfractionated: 0.14 IU/mL — ABNORMAL LOW (ref 0.30–0.70)
Heparin Unfractionated: 0.23 IU/mL — ABNORMAL LOW (ref 0.30–0.70)
Heparin Unfractionated: 0.33 IU/mL (ref 0.30–0.70)
Heparin Unfractionated: 0.43 IU/mL (ref 0.30–0.70)
Heparin Unfractionated: 0.48 IU/mL (ref 0.30–0.70)
Heparin Unfractionated: 0.61 IU/mL (ref 0.30–0.70)

## 2011-03-09 LAB — LIPID PANEL
Cholesterol: 86 mg/dL (ref 0–200)
HDL: 27 mg/dL — ABNORMAL LOW (ref >39)
LDL Cholesterol: 43 mg/dL (ref 0–99)
Total CHOL/HDL Ratio: 3.2 RATIO
Triglycerides: 80 mg/dL (ref <150)
VLDL: 16 mg/dL (ref 0–40)

## 2011-03-09 LAB — GLUCOSE, CAPILLARY: Glucose-Capillary: 66 mg/dL — ABNORMAL LOW (ref 70–99)

## 2011-03-09 LAB — TROPONIN I: Troponin I: 0.02 ng/mL (ref 0.00–0.06)

## 2011-03-09 LAB — MAGNESIUM: Magnesium: 1.9 mg/dL (ref 1.5–2.5)

## 2011-03-09 LAB — POCT CARDIAC MARKERS
CKMB, poc: 1.7 ng/mL (ref 1.0–8.0)
Myoglobin, poc: 101 ng/mL (ref 12–200)
Troponin i, poc: <0.05 ng/mL (ref 0.00–0.09)

## 2011-03-09 LAB — CULTURE, RESPIRATORY W GRAM STAIN: Culture: NORMAL

## 2011-03-09 LAB — DIGOXIN LEVEL
Digoxin Level: 0.4 ng/mL — ABNORMAL LOW (ref 0.8–2.0)
Digoxin Level: 0.6 ng/mL — ABNORMAL LOW (ref 0.8–2.0)

## 2011-03-09 LAB — HEMOGLOBIN A1C
Hgb A1c MFr Bld: 6 % — ABNORMAL HIGH (ref <5.7)
Mean Plasma Glucose: 126 mg/dL — ABNORMAL HIGH (ref <117)

## 2011-03-09 LAB — STREP PNEUMONIAE URINARY ANTIGEN: Strep Pneumo Urinary Antigen: NEGATIVE

## 2011-03-09 LAB — MRSA PCR SCREENING: MRSA by PCR: NEGATIVE

## 2011-03-09 LAB — TSH: TSH: 3.725 u[IU]/mL (ref 0.350–4.500)

## 2011-03-09 LAB — APTT: aPTT: 33 seconds (ref 24–37)

## 2011-03-09 LAB — LACTIC ACID, PLASMA: Lactic Acid, Venous: 4.7 mmol/L — ABNORMAL HIGH (ref 0.5–2.2)

## 2011-03-09 LAB — BRAIN NATRIURETIC PEPTIDE: Pro B Natriuretic peptide (BNP): 1145 pg/mL — ABNORMAL HIGH (ref 0.0–100.0)

## 2011-03-09 LAB — HEMOCCULT GUIAC POC 1CARD (OFFICE): Fecal Occult Bld: POSITIVE

## 2011-03-10 NOTE — Letter (Signed)
Summary: Appt Reminder 2  [facility]  [address] [place], [place] [address]   Phone: [phone]  Fax: [phone]        March 04, 2011 MRN: [id number]    Daniel [name] [address] [place], [place]  [address]    Dear Mr. Pileggi,   You have a return appointment with Dr.Gessner on 03-24-11 at 9:45am.  Please remember to bring a complete list of the medicines you are taking, your insurance card and your co-pay.  If you have to cancel or reschedule this appointment, please call before 5:00 pm the evening before to avoid a cancellation fee.  If you have any questions or concerns, please call [phone].    Sincerely,  [employer]

## 2011-03-17 ENCOUNTER — Ambulatory Visit: Payer: Medicare Other | Admitting: Gastroenterology

## 2011-03-24 ENCOUNTER — Ambulatory Visit (INDEPENDENT_AMBULATORY_CARE_PROVIDER_SITE_OTHER): Payer: Medicare Other | Admitting: Internal Medicine

## 2011-03-24 ENCOUNTER — Encounter: Payer: Self-pay | Admitting: Internal Medicine

## 2011-03-24 VITALS — BP 132/80 | HR 60 | Ht 69.0 in | Wt 116.0 lb

## 2011-03-24 DIAGNOSIS — D7389 Other diseases of spleen: Secondary | ICD-10-CM

## 2011-03-24 NOTE — Patient Instructions (Signed)
Keep your follow-up with primary care as planned and you will need a chest CT again in June 2012. You do not have a GIST tumor - you have a calcified lesion in your spleen that needs no further work-up. We will communicate that to primary care - Dr. Arvilla Market.

## 2011-03-24 NOTE — Assessment & Plan Note (Signed)
Seen as submucosal bulge on EGD and then found to be calcified on EUS. CT showed it was in his spleen. No further evaluation needed. Workup performed 2011. HE DOES NOT HAVE A GIST

## 2011-03-24 NOTE — Progress Notes (Signed)
This is a 69 year old [place] American man referred back for GI ST tumor of the stomach. However he does not have that. He was worked up for a submucosal gastric lesion last year with endoscopic ultrasound and CT scan which showed a calcified lesion in the spleen thought to be benign and needing no further workup. He has no complaints.

## 2011-04-02 ENCOUNTER — Ambulatory Visit: Payer: Medicare Other | Admitting: Ophthalmology

## 2011-04-03 ENCOUNTER — Encounter: Payer: Self-pay | Admitting: Ophthalmology

## 2011-04-03 ENCOUNTER — Ambulatory Visit (INDEPENDENT_AMBULATORY_CARE_PROVIDER_SITE_OTHER): Payer: Medicare Other | Admitting: Ophthalmology

## 2011-04-03 DIAGNOSIS — H538 Other visual disturbances: Secondary | ICD-10-CM

## 2011-04-03 DIAGNOSIS — I5040 Unspecified combined systolic (congestive) and diastolic (congestive) heart failure: Secondary | ICD-10-CM

## 2011-04-03 DIAGNOSIS — I10 Essential (primary) hypertension: Secondary | ICD-10-CM

## 2011-04-03 DIAGNOSIS — I4891 Unspecified atrial fibrillation: Secondary | ICD-10-CM

## 2011-04-03 NOTE — Assessment & Plan Note (Signed)
This is under excellent control with the patient's amiodarone, Toprol, and Lasix. Patient has no signs of edema, no shortness of breath or chest pain.

## 2011-04-03 NOTE — Assessment & Plan Note (Signed)
The patients blood pressure was within reasonable control today (BP: 140/68 mmHg ) and I will not make any adjustments to the patients anti-hypertensive regimen. I will continue to monitor and titrate the patients medications as needed at future visits.

## 2011-04-03 NOTE — Assessment & Plan Note (Addendum)
The patient continues to be on Pradaxa and has had no problems tolerating medication. I warned the patient about easy bleeding, as he previously worked with tin.

## 2011-04-03 NOTE — Patient Instructions (Signed)
Your blood pressure was under good control today, discontinue check her blood pressures, and contact us if they're elevated. Otherwise, please return in 4 months.

## 2011-04-03 NOTE — Assessment & Plan Note (Signed)
This is completely resolved, and the patient has now had a cataract extraction on the right side. Patient has no current complaints about his vision, and we'll continue to follow with Dr. Hazle Quant.
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
## 2011-05-05 NOTE — Assessment & Plan Note (Signed)
OFFICE VISIT   GRAM, SIEDLECKI  DOB:  1941-12-12                                       09/25/2009  ZOXWR#:[id number]   The patient is a 70 year old male referred for symptoms of peripheral  arterial occlusive disease.  He complains of his right leg and foot get  cold on occasion.  He denies any claudication symptoms.  He denies any  rest pain.  Primary atherosclerotic risk factors include hypertension  and smoking.  Greater than 3 minutes was spent today on smoking  cessation counseling.   He does not have any open ulcerations on the foot.  He is able to walk  unlimited distances without complaints of pain in his leg.   PAST MEDICAL HISTORY:  Is otherwise remarkable for peptic ulcer disease.  He had an episode of bleeding 2 years ago.   FAMILY HISTORY:  Is unremarkable.   SOCIAL HISTORY:  He is single, has four children.  Smokes a pack of  cigarettes a day.  Does not consume alcohol regularly.   REVIEW OF SYSTEMS:  He is 5 feet 9 and 119 pounds.  He has had some  recent weight loss.  CARDIAC:  He has history of palpitations, shortness of breath with  exertion, heart murmur and arrhythmia.  GI:  As mentioned above.  NEUROLOGIC:  He has occasional dizziness and headaches.  ORTHOPEDIC:  He has arthritis in multiple joints.  PSYCHIATRIC:  He has mild depression.  ENT:  He has had some decline in his eyesight.  HEMATOLOGIC:  He has a history of anemia.  PULMONARY AND VASCULAR:  Review of systems are negative.   MEDICATIONS:  1. Metoprolol 50 mg two times a day.  2. Simvastatin 20 mg once a day.  3. Prilosec once a day.  4. Vitamin B12 once a day.  5. Aspirin 325 mg once a day.  6. Hydrochlorothiazide 25 mg once a day.   ALLERGIES:  He has no known drug allergies.   PHYSICAL EXAM:  Vital signs:  Blood pressure 130/75 in the right arm,  pulse 73 and regular.  HEENT:  Unremarkable.  Neck:  Has 2+ carotid  pulses without bruit.  Chest:  Clear to  auscultation.  Cardiac:  Regular  rate and rhythm without murmur.  Abdomen:  Soft, nontender,  nondistended.  No masses.  Extremities:  He has 2+ brachial with absent  radial and ulnar pulses bilaterally.  He has 2+ femoral with absent  popliteal and pedal pulses bilaterally.  Feet are cool but symmetrically  cool bilaterally.  He has no open ulcerations on feet.   He had bilateral ABIs performed today which were 0.59 on the right with  monophasic wave forms and 0.66 on the left with monophasic wave forms.   In summary, the patient has objective evidence of some arterial  occlusive disease bilaterally based on his ABIs.  However, his only real  concern was the fact that sometimes his leg becomes cool to the touch.  I have reassured him today that he does have adequate perfusion to his  right leg currently.  He does not currently have any claudication  symptoms or limb threatening ischemia or evidence of rest pain.  I  believe the best option for him currently is risk factor modification  which hopefully includes smoking cessation.  He will continue to walk 30  minutes daily.  He will follow up in six months' time for repeat ABIs or  sooner if his symptoms become worse over time.   Janetta Hora. Fields, MD  Electronically Signed   CEF/MEDQ  D:  09/25/2009  T:  09/26/2009  Job:  [id number]   cc:   Zara Council, MD

## 2011-05-08 NOTE — Discharge Summary (Signed)
Daniel [name], Daniel [name]              ACCOUNT NO.:  [id number]   MEDICAL RECORD NO.:  [id number]          PATIENT TYPE:  INP   LOCATION:  5506                         FACILITY:  [facility]   PHYSICIAN:  Eliseo Gum, M.D.   DATE OF BIRTH:  04-Sep-1941   DATE OF ADMISSION:  11/25/2006  DATE OF DISCHARGE:  11/28/2006                               DISCHARGE SUMMARY   DISCHARGE DIAGNOSES:  1. Upper gastrointestinal bleed, secondary to gastric and duodenal      ulcer.  2. EKG:  ST changes.  3. Vitamin B12 deficiency.  4. Subclinical hyperthyroidism.  5. Abnormal chest x-ray.   MEDICATIONS ON DISCHARGE:  1. Protonix 40 mg b.i.d.  2. Lopressor 50 mg b.i.d.  3. Zocor 10 mg daily.   DISPOSITION AND FOLLOWUP:  Patient was discharged in stable condition.  His hemoglobin on discharge was 13.1 and his vitals were normal.  He is  to follow up with Dr. Antony Contras in [facility] on  the 18th of December, 2007 at 10 a.m.  At that time, Dr. Randon Goldsmith will  address his hemoglobin level and also with a repeat chest x-ray, which  was recommended to rule out left lower lobe nodule.  Patient is also to  receive vitamin B12 IM injections daily for 1 week at [facility] and then once every week for 4 weeks followed by once every  month for 10 months.   CONSULTS:  1. [facility], Dr. Dietrich Pates.  2. [facility], Dr. Leone Payor.   PROCEDURE:  1. Patient had a colonoscopy done on the 7th of December, 2007, which      showed a 2 x 3 cm duodenal ulcer with no active bleeding.  Biopsies      were taken and occult test was done.  2. He had a 2D echo done on the 8th of December, which showed left      ventricular systolic function normal at 55-60%.  Inadequate to      evaluate regional wall motion.  Overall left ventricular systolic      function was normal to reveal pericardial effusion.  3. Chest x-ray on the 6th of December, 2007, suspicion for small      nodule in the left lower  lung zone, recommend PA chest x-ray with      nipple markers.  4. Biopsy from duodenal ulcer was read as hypertropic and irregular      muscularis mucosa with associated benign reactive gastric-type      gland with necrosis and inflammation on the surface.  Negative for      H. pylori, negative for malignancy.   HISTORY OF PRESENT ILLNESS:  Daniel [name] is a 70 year old African American  male with no significant past history with a remote history of peptic  ulcer disease when he was much younger, presented to the ED following an  episode of maroon colored vomitus on the day of admission.  This was  followed by 2 episodes of black tarry stools.  He complained with vague  abdominal pain.  No cramping.  History of mild nausea.  Denies any  dysphagia, use of NSAIDs, constipation or diarrhea.  He does complain of  burning on lying down in retrosternal area and dizziness per wife.  Per  wife, she also says that his shortness of breath has worsened over the  period and he is as easily fatigued.  The family has noted weight loss,  though cannot quantify.  No history of previous bleeding, coagulopathy.  He used to be a heavy smoker for 40 years and a former heavy drinker.   PHYSICAL EXAMINATION:  VITAL SIGNS:  Temperature 97.7.  Pulse  fluctuating between 88 to 176, but in sinus rhythm with some SVT.  Respiratory rate 16.  Blood pressure 135/48.  Saturating 100% on 2  liters.  GENERAL:  No acute distress, very pleasant, cachectic gentleman.  HEENT:  Pupils equal, round and reacting to light.  Extraocular  movements intact.  Arcus senilis is present.  ENT:  Erythematous  oropharynx.  NECK:  A 1 x 1 cm lymph node palpable in right submandibular region.  No  thyromegaly or bruit.  RESPIRATORY:  Clear to auscultation bilaterally.  Good air movement.  CARDIOVASCULAR:  Tachycardic, regular rhythm with multiple premature  beats.  ABDOMEN:  Soft, nontender.  Bowel sounds present.  Bladder palpable.   CNS:  Nonfocal.  SKIN:  Warm, dry.  Poor turgor.  MUSCULOSKELETAL:  No acute distress.  PSYCH:  Appropriate.   LABS ON ADMISSION:  Sodium 142, potassium 4.1, chloride 109, bicarb 24,  BUN 33, creatinine 0.8, hemoglobin 11.2, white count 8.6, platelets 215,  MCV 103.  Occult blood stool positive.  Lipase 14.  PT 15.2, INR 1.2,  PTT 31.  UA showed a specific gravity of 1.021; otherwise, normal.  EKG  was read as sinus tach, possible LVH, ST-depression in 2, 3 leads,  reversed T in AVL V3, V4.   HOSPITAL COURSE:  1. Upper GI bleed.  The differential was vast, including peptic ulcer      disease versus gastritis versus malignancy versus AVM.  Patient was      admitted to step-down and serial CBCs were checked.  He had 2 large      IV bore needles and he was given 2 liters bolus of normal saline      followed by 125 mL per hour.  GI was consulted.  Meanwhile, patient      was put on Protonix drip, followed by a bolus of Protonix.  His      serologies were checked for H. pylori, which were negative.  Also,      orthostatic blood pressure was checked, but it was normal.  Two      units were typed and screened in case required.  GI consulted and      did decided to do EGD on the following day.  Meanwhile, patient      dropped his hemoglobin from 11.2 to 10.6 and then 9.6, especially      with EKG changes for ST and T-wave changes and supraventricular      tachycardia, was decided to transfuse him 2 units, which patient      tolerated well.  His post transfusion hemoglobin was 13.2.  EGD was      done on the 7th of December, second day of admission, which showed      a nonbleeding duodenal ulcer.  They continued Protonix drip for 72      hours, diet was gradually advanced, patient tolerated diet well and  patient was sent out on Protonix twice a day with close follow up      with Dr. Leone Payor.  Biopsy and occult tests were as dictated      earlier. 2. Supraventricular tachycardia.  Dr.  Dietrich Pates from [facility]      saw him.  He thought he was very stable and just recommended doing      a 2D echo, which were as dictated earlier.  They recommended risk      stratification for coronary artery disease, which for him was      hypertension.  His fasting lipid panel was checked, which was total      cholesterol 97, triglycerides 60, HDL 32, LDL 53 and low HDL.      Patient was started on Zocor 10 mg daily and Lopressor 50 mg twice      a day, which he tolerated well.  3. Vitamin B12 deficiency.  Level was 18.  Patient was started on      vitamin B12 IM injection.  He will be followed as an outpatient.  4. Subclinical hyperthyroidism.  His TSH was 0.373 with normal T4 at      1.03 and that will need to be monitored as an outpatient with a      repeat TSH.  5. Abnormal chest x-ray.  On repeat, it showed normal nipple shadows.   LABS ON DISCHARGE:  Hemoglobin 13.1, sodium 139, chloride 111, potassium  3.7, bicarb 23, glucose 149, BUN 5, creatinine 0.9 and calcium 8.8.      [place], MD  Electronically Signed     ______________________________  Eliseo Gum, M.D.    SP/MEDQ  D:  12/11/2006  T:  12/12/2006  Job:  [id number]

## 2011-05-08 NOTE — Assessment & Plan Note (Signed)
[facility]                            CARDIOLOGY OFFICE NOTE   JAHMEEK, SHIRK                       MRN:          [id number]  DATE:01/24/2007                            DOB:          10/21/1941    Daniel [name] was seen in followup at the [facility] on  January 24, 2007.  He is a 70 year old gentleman who was seen by Tereso Newcomer, PA-C on December 10, 2006.  At that time he underwent an  adenosine Myoview study to rule out significant obstructive coronary  artery disease, as well as an abdominal ultrasound to follow up on his  abdominal bruit.   In the interim, Mr. Morella has been doing well from a symptomatic  standpoint.  He has had minimal palpitations since beginning metoprolol.  He has no chest pain, dyspnea, or other complaints at this time.   CURRENT MEDICATIONS:  1. Metoprolol 50 mg twice daily.  2. Protonix 40 mg daily.  3. Vitamin B12 injections once a month.   EXAMINATION:  He is alert and oriented and in no acute distress.  His weight is 121 pounds.  Blood pressure 126/74.  Heart rate is 60.  Respiratory rate is 12.  HEENT:  Normal.  NECK:  Normal carotid upstrokes without bruits.  Jugular venous pressure  is normal.  LUNGS:  Clear to auscultation bilaterally.  CARDIOVASCULAR:  Regular rate and rhythm without murmurs or gallops.  ABDOMEN:  Soft and nontender.  No organomegaly.  The abdomen is thin.  EXTREMITIES:  No cyanosis, clubbing, or edema.  Peripheral pulses are 2+  and equal throughout.   STUDIES REVIEWED:  Included the patient's echocardiogram from the  hospital dated December 8, which demonstrated normal left ventricular  function with an LVEF of 60%.  An adenosine Myoview study demonstrated  no evidence of scar or ischemia.  His gated left ventricular ejection  fraction was 46% with septal dyssynergy noted.  His aortic ultrasound  demonstrated normal caliber abdominal aorta, as well as proximal  iliac  arteries without evidence of dilatation.  There was mild atherosclerosis  present in the aortoiliac region.  There was moderate left common iliac  artery stenosis.   ASSESSMENT:  Mr. Hopkin is currently stable from a cardiac standpoint.  He should continue on metoprolol for his palpitations.  He has no  evidence of ischemic heart disease and does not require any further  testing.  He does have some atherosclerotic disease seen on his  ultrasound study and would clearly benefit form tobacco cessation.  He  is certainly stable and would be fine to follow through with his  colonoscopy that has been recommended by Dr. Leone Payor.   I will plan on seeing Mr. Mcwhirter back on an as needed basis.  He can call  at any time with problems.     Veverly Fells. Excell Seltzer, MD  Electronically Signed    MDC/MedQ  DD: 01/24/2007  DT: 01/24/2007  Job #: [id number]   cc:   Iva Boop, MD,FACG

## 2011-05-08 NOTE — Assessment & Plan Note (Signed)
[facility]                         GASTROENTEROLOGY OFFICE NOTE   NAME:GREENKingsley, Farace                       MRN:          [id number]  DATE:02/14/2007                            DOB:          May 02, 1941    Mr. Campton returns for followup of gastric ulcer duodenal ulcer disease.   He saw Dr. Excell Seltzer who wanted him to continue on metoprolol. He was  encouraged to quit smoking yet he has not done so. He is ready to  proceed with a repeat endoscopy and he is also now ready to proceed with  a screening colonoscopy. He did not get blood work taken the last time.  He is on Protonix 40 mg daily.   PHYSICAL EXAMINATION:  Weight 119 pounds, pulse 80, blood pressure  126/80.   ASSESSMENT:  1. Duodenal and antral ulcers, H. pylori negative, no NSAIDs. Biopsies      were initially negative. He is clinically resolved.  2. Average risk for colon cancer, has not had screening colonoscopy.   PLAN:  1. Continue Protonix for now. He did have a GI bleed so it would be      reasonable to continue a PPI (consider Omeprazole for more cost      effective approach) for a year or more in this man. We will see      what the upper endoscopy shows i.e.  does it show resolution.  2. He will also schedule a screening colonoscopy.   The risks, benefits and indications of both of these have been  explained. His medications have been listed and reviewed in my chart as  well as allergies (none known). See my previous note of December 27, 2006  for past medical and social histories that are reviewed and unchanged.     Iva Boop, MD,FACG  Electronically Signed    CEG/MedQ  DD: 02/14/2007  DT: 02/14/2007  Job #: [id number]   cc:   [facility]

## 2011-05-08 NOTE — Assessment & Plan Note (Signed)
[facility]                            CARDIOLOGY OFFICE NOTE   NAME:Daniel [name], Daniel [name]                       MRN:          [id number]  DATE:12/10/2006                            DOB:          05/04/1941    HISTORY OF PRESENT ILLNESS:  Daniel [name] is a very pleasant 70 year old  male who returns to the office today for post hospitalization followup.  He was recently admitted to [facility] with upper GI bleed  related to peptic ulcer disease.  His EKG was noted to be abnormal, and  he had SVT on the monitor.  Dr. Dietrich Pates saw him and suggested adding a  beta blocker and followup echocardiogram.  The patient returns to the  office today for followup.  He denies chest pain, shortness of breath.  He denies any syncope.  He denies orthopnea or paroxysmal nocturnal  dyspnea.  He did occasionally have palpitations prior to his  hospitalization for his upper GI bleed.  Since starting on the  metoprolol and having his transfusion these have subsided.  He did skip  his metoprolol the other day for headache.  He felt some brief  palpitations on that day.  He restarted his medication and his symptoms  have resolved.   CURRENT MEDICATIONS:  1. Vitamin B12.  2. Protonix 40 mg twice daily.  3. Metoprolol 50 mg twice daily.   SOCIAL HISTORY:  He continues to smoke cigarettes.   PHYSICAL EXAMINATION:  He is a well-developed, well-nourished male in no  acute distress.  Blood pressure 134/78, pulse 74, weight 111 pounds.  HEENT:  Unremarkable.  NECK:  Without JVD.  Carotids without bruits bilaterally.  CARDIAC:  Normal S1, S2, regular rate and rhythm without murmurs.  LUNGS:  Clear to auscultation bilaterally without wheezing, rhonchi or  rales.  ABDOMEN:  Soft, nontender, with normal active bowel sounds, no  organomegaly.  Positive midline bruit.  EXTREMITIES:  Without edema, calves are soft, nontender.  SKIN:  Warm and dry.   Electrocardiogram is  abnormal with voltage criteria for LVH, sinus  rhythm with a heart rate of 74.  Peak T waves in V2 through V6, T waves  in V1 through V3.  The T wave changes are similar to previous tracings.   Database echocardiogram done November 27, 2006, revealed an EF of 55% to  60%.  The study was inadequate to evaluate for regional wall motion  abnormalities.  There was trivial pericardial effusion.   IMPRESSION:  1. Supraventricular tachycardia - controlled on beta blocker.  2. Good left ventricular function.  3. Abnormal electrocardiogram.  4. Abdominal bruit.  5. Hypertension, controlled.  6. History of recent upper gastrointestinal bleed secondary to peptic      ulcer.   PLAN:  The patient presents to the office today for followup.  He had a  recent admission for GI bleed.  He had evidence of SVT on the monitor,  and he has an abnormal electrocardiogram.  His echocardiogram was  inadequate to evaluate for regional wall motion abnormalities.  He  certainly has risk factors for coronary disease.  I have elected to go  ahead and set him up for an adenosine Myoview to rule out coronary  disease.  He can continue on his metoprolol.  We will get an abdominal  ultrasound to follow up on his abdominal bruit.  We will also check a  BMET today to make sure his potassium and renal function are stable  given the appearance of his electrocardiogram.  He has been encouraged  to get back in followup with Dr. Leone Payor.  I will bring him back in  followup with Dr. Excell Seltzer in the next 3 weeks.  I discussed the patient's  case today with Dr. Excell Seltzer.      Tereso Newcomer, PA-C  Electronically Signed      Veverly Fells. Excell Seltzer, MD  Electronically Signed   SW/MedQ  DD: 12/10/2006  DT: 12/10/2006  Job #: [id number]   cc:   Iva Boop, MD,FACG
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
## 2011-06-01 ENCOUNTER — Encounter: Payer: Self-pay | Admitting: *Deleted

## 2011-06-01 NOTE — Progress Notes (Signed)
Pt walked into clinic with c/o headache for 3 days, now resolved.  Also c/o sinus pressure and episodes of expectorating dark looking clots in AM.  No active bleeding, no sweating, fever or chills.  Pt wants to be seen tomorrow by Dr Arvilla Market, declines appointment today with other MD  Talked with Dr Aundria Rud and okay to wait until tomorrow.  ED if problems tonight.

## 2011-06-02 ENCOUNTER — Encounter: Payer: Self-pay | Admitting: Internal Medicine

## 2011-06-02 ENCOUNTER — Ambulatory Visit (INDEPENDENT_AMBULATORY_CARE_PROVIDER_SITE_OTHER): Payer: Medicare Other | Admitting: Internal Medicine

## 2011-06-02 DIAGNOSIS — J019 Acute sinusitis, unspecified: Secondary | ICD-10-CM | POA: Insufficient documentation

## 2011-06-02 DIAGNOSIS — J329 Chronic sinusitis, unspecified: Secondary | ICD-10-CM

## 2011-06-02 DIAGNOSIS — I10 Essential (primary) hypertension: Secondary | ICD-10-CM

## 2011-06-02 DIAGNOSIS — B9689 Other specified bacterial agents as the cause of diseases classified elsewhere: Secondary | ICD-10-CM

## 2011-06-02 DIAGNOSIS — A499 Bacterial infection, unspecified: Secondary | ICD-10-CM

## 2011-06-02 MED ORDER — DOXYCYCLINE HYCLATE 100 MG PO TABS: 100.0000 mg | ORAL_TABLET | Freq: Two times a day (BID) | ORAL | Status: DC

## 2011-06-02 NOTE — Assessment & Plan Note (Signed)
Patient symptoms of sinus congestion, postnasal drip, and epistaxis are consistent with acute sinusitis. As his symptoms have persisted for greater than 10 days I will prescribed a seven-day course of doxycycline. She does also advised to try over-the-counter saline nasal spray 2 help his congestion and also to keep his nares moist and prevent further epistaxis. Seasonal allergies may cause similar symptoms and if patient's symptoms do not resolve after a course of antibiotics we'll consider treatment for seasonal allergies.  Pt followup in 2 weeks to assess his response to antibiotic therapy.

## 2011-06-02 NOTE — Patient Instructions (Addendum)
Schedule a followup appointment with Dr. Arvilla Market in the last week of June. Try an over the counter saline nose spray. This will help with your sinus congestion and may also help keep your nostrils moist to prevent nosebleeds. Doxycycline is an antibiotic to treat your sinus infection. Be sure to take this medication as directed and to complete the full course of treatment. If you experience any side effects or have other questions or concerns please call the clinic at [phone]. If you experience any concerning symptoms of the clinic or go to the emergency room.

## 2011-06-02 NOTE — Progress Notes (Signed)
  Subjective:    Patient ID: Daniel [name], male    DOB: 07/26/1941, 70 y.o.   MRN: [id number]  HPI Pt reports feeling "stopped up" with mild frontal "heaviness."  He notes these symptoms have been present for >1week and last Friday he began to experience nose bleeds from huis right nostril.  He denies any associated pain and reports that the nose bleeding intermittently for three days but it's now resolved.  He notes the bleeding occurred mostly at night; he describes the bleed as a small trickle of blood with clots when blowing his nose.  He denies any fevers,  rigors, sore throat, ear pain, ear drainage itchy or watery eyes.  Denies sick contacts.  He admits to some post-nasal drainage with an associated dry cough as well as increase nasal drainage of white mucous.  He denies any productive cough or hemoptysis.   Review of Systems She denies chest pain, shortness of breath, syncope, abdominal pain, nausea, vomiting, diarrhea, or heart palpitations Phototherapy assistant completed. Pertinent items noted above and in history of present illness. All other systems reviewed and negative.    Objective:   Physical Exam VItal signs reviewed and stable.  Pressure slightly elevated above goal GEN: No apparent distress.  Alert and oriented x 3.  Pleasant, conversant, and cooperative to exam. HEENT: head is autraumatic and normocephalic.  Neck is supple without palpable masses or lymphadenopathy.  No JVD.  Vision intact.  EOMI.  PERRLA.  Sclerae anicteric.  Conjunctivae without pallor or injection. Mucous membranes are moist.  Oropharynx is without erythema, exudates, or other abnormal lesions.  Increased mucoid drainage present in posterior oropharynx.  Numerous teeth missing.  No tenderness of bilateral maxillary or frontal sinuses.  Bilateral ear canals are clear. Bilateral TMs are without erythema, bulging, or air fluid level and appear normal. RESP:  Lungs are clear to ascultation bilaterally with good  air movement.  No wheezes, ronchi, or rubs. CARDIOVASCULAR: Bradycardic, normal rhythm.  Clear S1, S2, no murmurs, gallops, or rubs. ABDOMEN: soft, non-tender, non-distended.  Bowels sounds present in all quadrants and normoactive.  No palpable masses. EXT: warm and dry. .  No  edema in bilateral lower extremities. SKIN: warm and dry with normal turgor.  No rashes or abnormal lesions observed. NEURO: CN II-XII grossly intact.  Muscle strength +5/5 in bilateral upper and lower extremities.  Sensation is grossly intact.  No focal deficit.        Assessment & Plan:

## 2011-06-02 NOTE — Assessment & Plan Note (Signed)
Mr. Falconi blood pressure is elevated above goal today. Reports checking his blood pressures regularly at home with an average systolic ranging between 115 to 120.  The elevated reading seen today may reflect whitecoat hypertension. I have asked the patient to followup in 2 weeks at which time we will recheck his blood pressure. I believe his home blood pressure cuff may reflect more accurate measurements; was asked to bring his home cuff with him to his next office visit to ensure it is appropriately calibrated.

## 2011-06-02 NOTE — Assessment & Plan Note (Signed)
Please refer to assessment and plan under problem entitled acute sinusitis. Briefly, patient will receive a seven-day course of antibiotic therapy with doxycycline for treatment of presumed acute bacterial sinusitis

## 2011-06-16 ENCOUNTER — Encounter: Payer: Self-pay | Admitting: Internal Medicine

## 2011-06-16 ENCOUNTER — Ambulatory Visit (INDEPENDENT_AMBULATORY_CARE_PROVIDER_SITE_OTHER): Payer: Medicare Other | Admitting: Internal Medicine

## 2011-06-16 VITALS — BP 149/67 | HR 59 | Temp 97.0°F | Ht 69.0 in | Wt 115.5 lb

## 2011-06-16 DIAGNOSIS — A499 Bacterial infection, unspecified: Secondary | ICD-10-CM

## 2011-06-16 DIAGNOSIS — J984 Other disorders of lung: Secondary | ICD-10-CM

## 2011-06-16 DIAGNOSIS — I10 Essential (primary) hypertension: Secondary | ICD-10-CM

## 2011-06-16 DIAGNOSIS — B9689 Other specified bacterial agents as the cause of diseases classified elsewhere: Secondary | ICD-10-CM

## 2011-06-16 DIAGNOSIS — I4891 Unspecified atrial fibrillation: Secondary | ICD-10-CM

## 2011-06-16 DIAGNOSIS — Z0000 Encounter for general adult medical examination without abnormal findings: Secondary | ICD-10-CM

## 2011-06-16 DIAGNOSIS — J329 Chronic sinusitis, unspecified: Secondary | ICD-10-CM

## 2011-06-16 NOTE — Assessment & Plan Note (Signed)
Patient declines all vaccinations.  He is also not interested in pursuing colonoscopy at this time.  I will readdress these items at his next office visit.

## 2011-06-16 NOTE — Progress Notes (Signed)
  Subjective:    Patient ID: Daniel [name], male    DOB: 02/17/1941, 70 y.o.   MRN: [id number]  HPI Please see the A&P for the status of the pt's chronic medical problems.   Review of Systems  Constitutional: Negative for fever, chills, diaphoresis, activity change, appetite change and fatigue.  HENT: Negative for congestion, facial swelling, rhinorrhea, trouble swallowing, dental problem and postnasal drip.   Eyes: Negative for photophobia.  Respiratory: Negative for cough, chest tightness, shortness of breath, wheezing and stridor.   Cardiovascular: Negative for chest pain, palpitations and leg swelling.  Gastrointestinal: Negative for abdominal pain and blood in stool.  Genitourinary: Negative for dysuria.  Skin: Negative for wound.  Neurological: Negative for dizziness, tremors, syncope, weakness, numbness and headaches.       Objective:   Physical Exam VItal signs reviewed and stable.  Blood pressure slightly elevated above goal GEN: No apparent distress.  Alert and oriented x 3.  Pleasant, conversant, and cooperative to exam. HEENT: head is autraumatic and normocephalic.  Neck is supple without palpable masses or lymphadenopathy.  No JVD.  Vision intact.  EOMI.  PERRLA.  Sclerae anicteric.  Conjunctivae without pallor or injection. Mucous membranes are moist.  Oropharynx is without erythema, exudates, or other abnormal lesions. Numerous teeth missing.  RESP:  Lungs are clear to ascultation bilaterally with good air movement.  No wheezes, ronchi, or rubs. CARDIOVASCULAR: Bradycardic, normal rhythm.  Clear S1, S2, no murmurs, gallops, or rubs. EXT: warm and dry. Distal pulses intact.  No  edema in bilateral lower extremities. SKIN: warm and dry with normal turgor.  No rashes or abnormal lesions observed. NEURO: CN II-XII grossly intact.  No focal deficit.       Assessment & Plan:

## 2011-06-16 NOTE — Progress Notes (Signed)
Pt aware of CT lung [facility] 08/25/11 11AM - NPO 4 hours before test. Dr Arvilla Market aware of date CT. Stanton Kidney Julietta Batterman RN 06/16/11 3:30PM

## 2011-06-16 NOTE — Assessment & Plan Note (Signed)
Patient symptoms are improved following treatment with doxycycline.  There is no need for further treatment with antibiotics.

## 2011-06-16 NOTE — Assessment & Plan Note (Signed)
Patient has a 7 mm left lower lobe lung nodule that was initially discovered on a CT scan in November of 2011.  He is due for a repeat scan to followup on his pulmonary nodule.  We will refer him for this today.  He denies any weight loss, fever, rigors, hemoptysis, cough, or chest pain.  He is certainly at risk for lung carcinoma given his smoking history.  He states that regardless of the CT scan findings he is not interested in any surgical therapy but is interested in following the status of this nodule.    I informed him that for now, as to appropriate thing to do was to continue with followup scanning and decide on further management and treatment once we have more results.  He agrees with this plan

## 2011-06-16 NOTE — Assessment & Plan Note (Addendum)
Pt reports his home BP measurements continue to remain 115-130/60s.  He had his cuff calibrated recently at his pharmacy and his BP measurements remain within normal limits.  I believe he has "white coat" hypertension; his home blood pressure measurements continue to remain within normal limits.  I see no indication to change his current medication regimen at this time.  Will check a comprehensive metabolic panel to assess electrolyte status and renal function.

## 2011-06-16 NOTE — Patient Instructions (Signed)
Schedule a followup appointment with Dr. Arvilla Market in 3 months, sooner if needed. Keep taking all of yoru medicine as directed. We will schedule you for a CT scan to check on the spot (nodule) in your lung. I will call you if any of your blood work is abnormal and to discuss the results of your CT scan. If you have any questions or concerns please call the clinic at [phone]. If you experience any concerning symptoms of the clinic or go to the emergency room. HAPPY BIRTHDAY! Have fun on your trip!

## 2011-06-16 NOTE — Assessment & Plan Note (Signed)
Patient remains rate controlled and is in regular rhythm today on exam.  He is tolerating his metoprolol and amiodarone well. His last TSH was checked in January of this year, will repeat this again today.

## 2011-06-27 ENCOUNTER — Other Ambulatory Visit: Payer: Self-pay | Admitting: Internal Medicine

## 2011-06-29 ENCOUNTER — Other Ambulatory Visit: Payer: Self-pay | Admitting: *Deleted

## 2011-06-29 NOTE — Telephone Encounter (Signed)
Had been refilled, no need for encounter

## 2011-07-06 ENCOUNTER — Other Ambulatory Visit: Payer: Self-pay | Admitting: Internal Medicine

## 2011-07-10 ENCOUNTER — Other Ambulatory Visit: Payer: Self-pay | Admitting: Cardiology

## 2011-07-13 ENCOUNTER — Telehealth: Payer: Self-pay | Admitting: Cardiology

## 2011-07-13 ENCOUNTER — Other Ambulatory Visit: Payer: Self-pay | Admitting: Cardiology

## 2011-07-13 ENCOUNTER — Other Ambulatory Visit: Payer: Self-pay | Admitting: Internal Medicine

## 2011-07-13 ENCOUNTER — Telehealth: Payer: Self-pay | Admitting: *Deleted

## 2011-07-13 NOTE — Telephone Encounter (Signed)
Pt calls and speaks to jim shaw about having problems getting refills, i reviewed his med list, med by med, i made pt aware that last refills from int med were for 43yr and scripts may be filled for only 1 year and controlled substances less than 1 year, some of his meds are filled by dr Alford Highland office and i have noted that dr Shirlee Latch fills for periods of 6 months. Pt states he was going by info on med bottles and unfortunately that is not always the accurate info. i have suggested that he always speak to a person at the pharm and check how many refills are on his meds, also i have told him when refill requests are made that the md has 48 hours to respond. Pt seems pleased to know this info, thanks me and call is ended

## 2011-07-13 NOTE — Telephone Encounter (Signed)
Pt needs refill on pacerone 200mg  qd, metoprolol er 50mg  qd, enalapril 10mg  qd, pradaxa qd called into [facility] on [address]

## 2011-07-14 MED ORDER — METOPROLOL SUCCINATE ER 50 MG PO TB24: ORAL_TABLET | ORAL | Status: DC

## 2011-07-14 MED ORDER — AMIODARONE HCL 200 MG PO TABS: 200.0000 mg | ORAL_TABLET | Freq: Every day | ORAL | Status: DC

## 2011-07-14 MED ORDER — ENALAPRIL MALEATE 10 MG PO TABS: 10.0000 mg | ORAL_TABLET | Freq: Every day | ORAL | Status: DC

## 2011-07-14 NOTE — Telephone Encounter (Signed)
Pt has returned your call please call him [phone].

## 2011-07-14 NOTE — Telephone Encounter (Signed)
LMTCB

## 2011-07-14 NOTE — Telephone Encounter (Signed)
I talked with pt. Pt requesting refills of Enalapril 10mg  bid / Metoprolol 75mg  daily/ and Amiodarone 200mg  daily. I refill these to [facility]. Pt was due to see Dr Shirlee Latch in April 2012. I have made pt an appt with Dr Shirlee Latch for 08/12/11. Pt is aware he needs to keep this appt to continue to fill meds.

## 2011-07-14 NOTE — Telephone Encounter (Signed)
Katina Dung, RN 07/14/2011 4:12 PM Signed  I talked with pt. Pt requesting refills of Enalapril 10mg  bid / Metoprolol 75mg  daily/ and Amiodarone 200mg  daily. I refill these to [facility]. Pt was due to see Dr Shirlee Latch in April 2012. I have made pt an appt with Dr Shirlee Latch for 08/12/11. Pt is aware he needs to keep this appt to continue to fill meds.

## 2011-07-22 ENCOUNTER — Other Ambulatory Visit: Payer: Self-pay | Admitting: *Deleted

## 2011-07-22 ENCOUNTER — Encounter: Payer: Self-pay | Admitting: *Deleted

## 2011-07-22 ENCOUNTER — Other Ambulatory Visit: Payer: Medicare Other

## 2011-07-22 ENCOUNTER — Telehealth: Payer: Self-pay | Admitting: Cardiology

## 2011-07-22 LAB — COMPREHENSIVE METABOLIC PANEL
ALT: 21 U/L (ref 0–53)
AST: 24 U/L (ref 0–37)
Albumin: 4.1 g/dL (ref 3.5–5.2)
Alkaline Phosphatase: 61 U/L (ref 39–117)
BUN: 11 mg/dL (ref 6–23)
CO2: 25 mEq/L (ref 19–32)
Calcium: 10.3 mg/dL (ref 8.4–10.5)
Chloride: 106 mEq/L (ref 96–112)
Creat: 1.25 mg/dL (ref 0.50–1.35)
Glucose, Bld: 73 mg/dL (ref 70–99)
Potassium: 4.6 mEq/L (ref 3.5–5.3)
Sodium: 142 mEq/L (ref 135–145)
Total Bilirubin: 0.3 mg/dL (ref 0.3–1.2)
Total Protein: 6.6 g/dL (ref 6.0–8.3)

## 2011-07-22 LAB — TSH: TSH: 1.123 u[IU]/mL (ref 0.350–4.500)

## 2011-07-22 MED ORDER — ENALAPRIL MALEATE 10 MG PO TABS: 10.0000 mg | ORAL_TABLET | Freq: Two times a day (BID) | ORAL | Status: DC

## 2011-07-22 MED ORDER — METOPROLOL SUCCINATE ER 50 MG PO TB24: 75.0000 mg | ORAL_TABLET | Freq: Every day | ORAL | Status: DC

## 2011-07-22 MED ORDER — AMIODARONE HCL 200 MG PO TABS: 200.0000 mg | ORAL_TABLET | Freq: Every day | ORAL | Status: DC

## 2011-07-22 NOTE — Telephone Encounter (Signed)
Please call phar and give directions for one of his medications.  Pt will be back this pm to get.

## 2011-07-22 NOTE — Progress Notes (Unsigned)
Pt presents to c/o about dr Shirlee Latch not giving him refills on time, pt is pleasant but as i told pt dr Alford Highland office would need to explain to him why, he tells me that it was because he missed an appt, i explained that the dr could refuse refills if visits are not kept but again he would need to speak w/ that dr's office. He ask if he chose not to see dr Shirlee Latch again would dr mills fill 3 of his meds that dr Shirlee Latch has been filling, i told him he would need to speak w/ dr mills at an appt and it would be her decision, he was agreeable to an appt and the appt was arranged for him 08/21/2011 w/ dr mills. i wished him happy birthday and he left smiling!

## 2011-07-22 NOTE — Telephone Encounter (Signed)
Pharmacy needed to verify dose of enalapril was sent 2 ways, I saw his meds are duplicated, I will correct the duplication and I verified with [facility] the dose of Enalapril

## 2011-07-23 ENCOUNTER — Telehealth: Payer: Self-pay | Admitting: *Deleted

## 2011-07-23 NOTE — Telephone Encounter (Signed)
An appt for CT Scan had been scheduled on  08/25/11; pt was called and was awared of this appt.

## 2011-07-23 NOTE — Telephone Encounter (Signed)
Thank you. K

## 2011-07-23 NOTE — Progress Notes (Signed)
Thanks, Helen!  

## 2011-07-23 NOTE — Telephone Encounter (Signed)
Message copied by Hassan Buckler on Thu Jul 23, 2011  8:57 AM ------      Message from: Nelda Bucks T      Created: Thu Jul 23, 2011  8:38 AM       Hey!              At Daniel [name]'s last appt, he was referred for repeat CT chest to evaluate a lung nodule.  At that time, he wished to defer the study until he after his birthday.  Do you know if he has scheduled an appt for this test?  Thanks!            -Belenda Cruise

## 2011-08-12 ENCOUNTER — Ambulatory Visit (INDEPENDENT_AMBULATORY_CARE_PROVIDER_SITE_OTHER): Payer: Medicare Other | Admitting: Cardiology

## 2011-08-12 ENCOUNTER — Encounter: Payer: Self-pay | Admitting: Cardiology

## 2011-08-12 DIAGNOSIS — I5032 Chronic diastolic (congestive) heart failure: Secondary | ICD-10-CM

## 2011-08-12 DIAGNOSIS — I714 Abdominal aortic aneurysm, without rupture, unspecified: Secondary | ICD-10-CM

## 2011-08-12 DIAGNOSIS — I10 Essential (primary) hypertension: Secondary | ICD-10-CM

## 2011-08-12 DIAGNOSIS — I428 Other cardiomyopathies: Secondary | ICD-10-CM

## 2011-08-12 DIAGNOSIS — I509 Heart failure, unspecified: Secondary | ICD-10-CM

## 2011-08-12 DIAGNOSIS — J984 Other disorders of lung: Secondary | ICD-10-CM

## 2011-08-12 DIAGNOSIS — I739 Peripheral vascular disease, unspecified: Secondary | ICD-10-CM

## 2011-08-12 DIAGNOSIS — I4891 Unspecified atrial fibrillation: Secondary | ICD-10-CM

## 2011-08-12 DIAGNOSIS — F17200 Nicotine dependence, unspecified, uncomplicated: Secondary | ICD-10-CM

## 2011-08-12 DIAGNOSIS — E785 Hyperlipidemia, unspecified: Secondary | ICD-10-CM

## 2011-08-12 LAB — LIPID PANEL
Cholesterol: 122 mg/dL (ref 0–200)
HDL: 54.2 mg/dL (ref >39.00)
LDL Cholesterol: 56 mg/dL (ref 0–99)
Total CHOL/HDL Ratio: 2
Triglycerides: 60 mg/dL (ref 0.0–149.0)
VLDL: 12 mg/dL (ref 0.0–40.0)

## 2011-08-12 NOTE — Patient Instructions (Signed)
Your physician has requested that you have an echocardiogram. Echocardiography is a painless test that uses sound waves to create images of your heart. It provides your doctor with information about the size and shape of your heart and how well your heart's chambers and valves are working. This procedure takes approximately one hour. There are no restrictions for this procedure.    Your physician recommends that you schedule a follow-up appointment in: 6 months with Dr Shirlee Latch  Your physician has requested that you have an abdominal aorta duplex. During this test, an ultrasound is used to evaluate the aorta. Allow 30 minutes for this exam. Do not eat after midnight the day before and avoid carbonated beverages   Your physician recommends that you return for lab work in: today (Lipid profile) Diag: 401.9, 428.32, 443.9

## 2011-08-13 DIAGNOSIS — I428 Other cardiomyopathies: Secondary | ICD-10-CM | POA: Insufficient documentation

## 2011-08-13 DIAGNOSIS — I739 Peripheral vascular disease, unspecified: Secondary | ICD-10-CM

## 2011-08-13 HISTORY — DX: Other cardiomyopathies: I42.8

## 2011-08-13 HISTORY — DX: Peripheral vascular disease, unspecified: I73.9

## 2011-08-13 NOTE — Assessment & Plan Note (Signed)
I strongly encouraged him to quit smoking.  He is going to think about trying wellbutrin.

## 2011-08-13 NOTE — Assessment & Plan Note (Signed)
Prominent abdominal aortic pulsation and history of small AAA.  Will get abdominal ultrasound to follow.

## 2011-08-13 NOTE — Assessment & Plan Note (Signed)
Check lipids, goal LDL < 70.  

## 2011-08-13 NOTE — Assessment & Plan Note (Signed)
Per patient, PCP has planned a CT chest next month to followup on lung nodule.

## 2011-08-13 NOTE — Assessment & Plan Note (Signed)
Claudication improved with increased activity.  Will follow with repeat ABIs.
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
## 2011-08-13 NOTE — Assessment & Plan Note (Signed)
Patient is in NSR today.  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  On Pradaxa for anticoagulation with stable creatinine when last checked.  Will try to maintain NSR with amiodarone.  PFTs on amiodarone were ok.  Will need yearly eye exam. LFTs and TSH ok in 8/12.

## 2011-08-13 NOTE — Assessment & Plan Note (Signed)
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  EF back to 50-55% on 6/11 echo with maintenance of NSR with amiodarone.  He appears euvolemic.  Continue Toprol XL and enalapril.  Will repeat echo to make sure that LV systolic function is stable.

## 2011-08-21 ENCOUNTER — Encounter: Payer: Medicare Other | Admitting: Internal Medicine

## 2011-08-25 ENCOUNTER — Ambulatory Visit (HOSPITAL_COMMUNITY)
Admission: RE | Admit: 2011-08-25 | Discharge: 2011-08-25 | Disposition: A | Discharge status: Home / Self Care | Payer: Medicare Other | Source: Ambulatory Visit | Attending: Internal Medicine | Admitting: Internal Medicine

## 2011-08-25 DIAGNOSIS — J438 Other emphysema: Secondary | ICD-10-CM | POA: Insufficient documentation

## 2011-08-25 DIAGNOSIS — F17200 Nicotine dependence, unspecified, uncomplicated: Secondary | ICD-10-CM | POA: Insufficient documentation

## 2011-08-25 DIAGNOSIS — Z09 Encounter for follow-up examination after completed treatment for conditions other than malignant neoplasm: Secondary | ICD-10-CM | POA: Insufficient documentation

## 2011-08-25 DIAGNOSIS — J984 Other disorders of lung: Secondary | ICD-10-CM | POA: Insufficient documentation

## 2011-08-25 MED ORDER — IOHEXOL 300 MG/ML  SOLN
80.0000 mL | Freq: Once | INTRAMUSCULAR | Status: AC | PRN
  Administered 2011-08-25: 80 mL via INTRAVENOUS

## 2011-09-04 ENCOUNTER — Other Ambulatory Visit: Payer: Self-pay | Admitting: Internal Medicine

## 2011-09-04 DIAGNOSIS — R911 Solitary pulmonary nodule: Secondary | ICD-10-CM

## 2011-09-04 DIAGNOSIS — J984 Other disorders of lung: Secondary | ICD-10-CM

## 2011-09-04 NOTE — Assessment & Plan Note (Signed)
Will order f/u CT chest without contrast per rads recs for 02/2012; six months from recent CT.Marland Kitchen

## 2011-09-10 NOTE — Progress Notes (Signed)
Appt has been scheduled Mar. 14,2013 @ 1030AM.

## 2011-09-15 ENCOUNTER — Telehealth: Payer: Self-pay | Admitting: Cardiovascular Disease

## 2011-09-15 NOTE — Telephone Encounter (Signed)
I talked with pt Daniel [name] at the phone number on this message for [employer], [phone]. I confirmed that Daniel [name] had called about numbness in his left hand. This  message is on a pt named Daniel [name] [birth year] not Morene Crocker. I will forward a new message to Dr [employer] nurse on [employer].

## 2011-09-15 NOTE — Telephone Encounter (Signed)
Pt is having numbness in the left hand x 4 days.  No other symptoms, but is concerning to him.  Please call and talk to him regarding the same home number [phone], cell [phone]

## 2011-09-16 ENCOUNTER — Encounter (INDEPENDENT_AMBULATORY_CARE_PROVIDER_SITE_OTHER): Payer: Medicare Other | Admitting: Cardiology

## 2011-09-16 ENCOUNTER — Ambulatory Visit (HOSPITAL_COMMUNITY): Payer: Medicare Other | Attending: Cardiovascular Disease | Admitting: Radiology

## 2011-09-16 DIAGNOSIS — I5032 Chronic diastolic (congestive) heart failure: Secondary | ICD-10-CM

## 2011-09-16 DIAGNOSIS — I428 Other cardiomyopathies: Secondary | ICD-10-CM | POA: Insufficient documentation

## 2011-09-16 DIAGNOSIS — I10 Essential (primary) hypertension: Secondary | ICD-10-CM

## 2011-09-16 DIAGNOSIS — I714 Abdominal aortic aneurysm, without rupture: Secondary | ICD-10-CM

## 2011-09-16 DIAGNOSIS — I739 Peripheral vascular disease, unspecified: Secondary | ICD-10-CM

## 2011-09-16 DIAGNOSIS — I079 Rheumatic tricuspid valve disease, unspecified: Secondary | ICD-10-CM | POA: Insufficient documentation

## 2011-09-16 DIAGNOSIS — I4891 Unspecified atrial fibrillation: Secondary | ICD-10-CM | POA: Insufficient documentation

## 2011-09-16 DIAGNOSIS — I059 Rheumatic mitral valve disease, unspecified: Secondary | ICD-10-CM | POA: Insufficient documentation

## 2011-09-16 DIAGNOSIS — F17200 Nicotine dependence, unspecified, uncomplicated: Secondary | ICD-10-CM | POA: Insufficient documentation

## 2012-01-22 ENCOUNTER — Other Ambulatory Visit: Payer: Self-pay | Admitting: *Deleted

## 2012-01-22 NOTE — Telephone Encounter (Signed)
Pt calls requesting refill of pradaxia, he is advised he needs to schedule an appt, he states that dr mills scheduled him an appt at his last appt in June, that she wrote him a letter, he was informed that he did not have an appt scheduled in int med clinic. i went over everything he had upcoming and read his end of visit instructions to him twice, he then agreed that they were the letter he was speaking of and then realized that he was informed to have a 3mon f/u visit. He then stated that the doctors should make the appts for the pt, i explained that it just couldn't work that way and made him an appt w/ dr mills.

## 2012-01-25 ENCOUNTER — Other Ambulatory Visit: Payer: Self-pay | Admitting: *Deleted

## 2012-01-25 MED ORDER — DABIGATRAN ETEXILATE MESYLATE 150 MG PO CAPS: 150.0000 mg | ORAL_CAPSULE | Freq: Two times a day (BID) | ORAL | Status: DC

## 2012-01-25 NOTE — Telephone Encounter (Signed)
Seen in June. 3 month F/U requested, Pls sch appt Dr Arvilla Market - first available

## 2012-02-01 ENCOUNTER — Other Ambulatory Visit: Payer: Self-pay | Admitting: Internal Medicine

## 2012-02-01 ENCOUNTER — Other Ambulatory Visit: Payer: Self-pay | Admitting: Ophthalmology

## 2012-02-11 ENCOUNTER — Ambulatory Visit: Payer: Medicare Other | Admitting: Cardiology

## 2012-02-11 ENCOUNTER — Ambulatory Visit (INDEPENDENT_AMBULATORY_CARE_PROVIDER_SITE_OTHER): Payer: Medicare Other | Admitting: Cardiology

## 2012-02-11 ENCOUNTER — Encounter: Payer: Self-pay | Admitting: Cardiology

## 2012-02-11 DIAGNOSIS — R0989 Other specified symptoms and signs involving the circulatory and respiratory systems: Secondary | ICD-10-CM

## 2012-02-11 DIAGNOSIS — I739 Peripheral vascular disease, unspecified: Secondary | ICD-10-CM

## 2012-02-11 DIAGNOSIS — I4891 Unspecified atrial fibrillation: Secondary | ICD-10-CM | POA: Diagnosis not present

## 2012-02-11 DIAGNOSIS — I10 Essential (primary) hypertension: Secondary | ICD-10-CM | POA: Diagnosis not present

## 2012-02-11 DIAGNOSIS — J984 Other disorders of lung: Secondary | ICD-10-CM

## 2012-02-11 DIAGNOSIS — E785 Hyperlipidemia, unspecified: Secondary | ICD-10-CM | POA: Diagnosis not present

## 2012-02-11 DIAGNOSIS — F17200 Nicotine dependence, unspecified, uncomplicated: Secondary | ICD-10-CM

## 2012-02-11 DIAGNOSIS — I428 Other cardiomyopathies: Secondary | ICD-10-CM

## 2012-02-11 DIAGNOSIS — I779 Disorder of arteries and arterioles, unspecified: Secondary | ICD-10-CM

## 2012-02-11 MED ORDER — ATORVASTATIN CALCIUM 20 MG PO TABS: 20.0000 mg | ORAL_TABLET | Freq: Every day | ORAL | Status: DC

## 2012-02-11 MED ORDER — AMLODIPINE BESYLATE 5 MG PO TABS: 5.0000 mg | ORAL_TABLET | Freq: Every day | ORAL | Status: DC

## 2012-02-11 NOTE — Patient Instructions (Addendum)
Your physician has requested that you have a carotid duplex. This test is an ultrasound of the carotid arteries in your neck. It looks at blood flow through these arteries that supply the brain with blood. Allow one hour for this exam. There are no restrictions or special instructions.  Your physician has requested that you have a renal artery duplex. During this test, an ultrasound is used to evaluate blood flow to the kidneys. Allow one hour for this exam. Do not eat after midnight the day before and avoid carbonated beverages. Take your medications as you usually do.      Take and record your blood pressure every day. I will call you in 2 weeks to get the readings. Luana Shu [phone]  Stop simvastatin.  Start atorvastatin 20mg  daily for your cholesterol instead of simvastatin.  Start amlodipine 5mg  daily for your blood pressure.  Your physician recommends that you return for lab work today--BMET/TSH/Liver profile  Your physician recommends that you return for a FASTING lipid profile/liver profile in 2 months  Your physician recommends that you schedule a follow-up appointment in: 4 months with Dr Shirlee Latch.

## 2012-02-12 LAB — HEPATIC FUNCTION PANEL
ALT: 18 U/L (ref 0–53)
AST: 23 U/L (ref 0–37)
Albumin: 3.9 g/dL (ref 3.5–5.2)
Alkaline Phosphatase: 66 U/L (ref 39–117)
Bilirubin, Direct: 0.1 mg/dL (ref 0.0–0.3)
Total Bilirubin: 0.4 mg/dL (ref 0.3–1.2)
Total Protein: 6.8 g/dL (ref 6.0–8.3)

## 2012-02-12 LAB — BASIC METABOLIC PANEL
BUN: 7 mg/dL (ref 6–23)
CO2: 28 mEq/L (ref 19–32)
Calcium: 9.9 mg/dL (ref 8.4–10.5)
Chloride: 103 mEq/L (ref 96–112)
Creatinine, Ser: 1.2 mg/dL (ref 0.4–1.5)
GFR: 75.4 mL/min (ref >60.00)
Glucose, Bld: 97 mg/dL (ref 70–99)
Potassium: 4.1 mEq/L (ref 3.5–5.1)
Sodium: 139 mEq/L (ref 135–145)

## 2012-02-12 LAB — TSH: TSH: 0.93 u[IU]/mL (ref 0.35–5.50)

## 2012-02-14 NOTE — Assessment & Plan Note (Signed)
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  This has been stable, after walking about 1/4 mile.  No rest pain, no ischemic lesions on his foot.  I recommended a PV consult but he wants to hold off for now.  He says he stopped smoking yesterday.

## 2012-02-14 NOTE — Assessment & Plan Note (Signed)
Repeat CT to follow in 3/13.

## 2012-02-14 NOTE — Assessment & Plan Note (Signed)
I talked to him about the importance of stopping cigarettes.
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
## 2012-02-14 NOTE — Assessment & Plan Note (Signed)
Carotid bruits present.  Will get carotid dopplers.

## 2012-02-14 NOTE — Assessment & Plan Note (Addendum)
BP continues to run high.  I am going to start amlodipine 5 mg daily.  He has an abdominal bruit so I will also get renal artery dopplers to rule out renal artery stenosis. BP check in 2 wks on amlodipine.

## 2012-02-14 NOTE — Assessment & Plan Note (Signed)
Since we are starting amlodipine for BP, I am going to stop simvastatin and have him start atorvastatin 20 mg daily with lipids/LFTs in 2 months.  Goal LDL < 70.

## 2012-02-14 NOTE — Assessment & Plan Note (Signed)
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  EF back to 55-60% by last echo with maintenance of NSR with amiodarone.  He appears euvolemic.  Continue Toprol XL and enalapril.   

## 2012-02-14 NOTE — Assessment & Plan Note (Signed)
Patient is in NSR today.  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  On Pradaxa for anticoagulation with stable creatinine when last checked.  Will try to maintain NSR with amiodarone.  PFTs on amiodarone were ok.  Will need yearly eye exam. LFTs and TSH today.  

## 2012-02-19 ENCOUNTER — Ambulatory Visit (INDEPENDENT_AMBULATORY_CARE_PROVIDER_SITE_OTHER): Payer: Medicare Other | Admitting: Internal Medicine

## 2012-02-19 ENCOUNTER — Encounter: Payer: Self-pay | Admitting: Internal Medicine

## 2012-02-19 VITALS — BP 183/75 | HR 60 | Temp 96.9°F | Ht 69.0 in | Wt 115.7 lb

## 2012-02-19 DIAGNOSIS — N400 Enlarged prostate without lower urinary tract symptoms: Secondary | ICD-10-CM

## 2012-02-19 DIAGNOSIS — E785 Hyperlipidemia, unspecified: Secondary | ICD-10-CM | POA: Diagnosis not present

## 2012-02-19 DIAGNOSIS — F17200 Nicotine dependence, unspecified, uncomplicated: Secondary | ICD-10-CM | POA: Diagnosis not present

## 2012-02-19 DIAGNOSIS — I10 Essential (primary) hypertension: Secondary | ICD-10-CM | POA: Diagnosis not present

## 2012-02-19 LAB — LIPID PANEL
Cholesterol: 149 mg/dL (ref 0–200)
HDL: 59 mg/dL (ref >39)
LDL Cholesterol: 74 mg/dL (ref 0–99)
Total CHOL/HDL Ratio: 2.5 Ratio
Triglycerides: 82 mg/dL (ref <150)
VLDL: 16 mg/dL (ref 0–40)

## 2012-02-19 MED ORDER — TAMSULOSIN HCL 0.4 MG PO CAPS: 0.4000 mg | ORAL_CAPSULE | Freq: Every day | ORAL | Status: DC

## 2012-02-19 NOTE — Patient Instructions (Addendum)
Schedule a followup appointment with Dr. Arvilla Market in the end of March after your CT scan. Tamsulosin is a new medicine to help the prostate. Take one pill every day. It may make you feel very dizzy when you first stands u for the first few days; be sure to get up slowly. We will check perform your yearly cholesterol check today.

## 2012-02-19 NOTE — Progress Notes (Signed)
  Subjective:    Patient ID: Daniel [name], male    DOB: [birth year], 71 y.o.   MRN: [id number]  HPI Please see the A&P for the status of the pt's chronic medical problems.  Pt has his BP log with him and SBPs range between 135-178.  He continues to smoke and is down to 4 cigarettes per day.   Review of Systems  Constitutional: Negative for fever, chills, diaphoresis, activity change, appetite change and fatigue.  HENT: Negative for congestion, facial swelling, rhinorrhea, trouble swallowing, dental problem and postnasal drip.   Eyes: Negative for photophobia.  Respiratory: Negative for cough, chest tightness, shortness of breath, wheezing and stridor.   Cardiovascular: Negative for chest pain, palpitations and leg swelling.  Gastrointestinal: Negative for abdominal pain and blood in stool.  Genitourinary: Negative for dysuria.  Skin: Negative for wound.  Neurological: Negative for dizziness, tremors, syncope, weakness, numbness and headaches.       Objective:   Physical Exam VItal signs reviewed and stable.  Blood pressure slightly elevated above goal GEN: No apparent distress.  Alert and oriented x 3.  Pleasant, conversant, and cooperative to exam. HEENT: head is autraumatic and normocephalic.  Neck is supple without palpable masses or lymphadenopathy.  No JVD.  Vision intact.  EOMI.  PERRLA.  Sclerae anicteric.  Conjunctivae without pallor or injection. Mucous membranes are moist.  Oropharynx is without erythema, exudates, or other abnormal lesions. Numerous teeth missing.  RESP:  Lungs are clear to ascultation bilaterally with good air movement.  No wheezes, ronchi, or rubs. CARDIOVASCULAR: Bradycardic, normal rhythm.  Clear S1, S2, no murmurs, gallops, or rubs. EXT: warm and dry. Distal pulses intact.  No  edema in bilateral lower extremities. SKIN: warm and dry with normal turgor.  No rashes or abnormal lesions observed. NEURO: CN II-XII grossly intact.  No focal deficit.        Assessment & Plan:

## 2012-02-24 ENCOUNTER — Encounter (INDEPENDENT_AMBULATORY_CARE_PROVIDER_SITE_OTHER): Payer: Medicare Other

## 2012-02-24 DIAGNOSIS — R0989 Other specified symptoms and signs involving the circulatory and respiratory systems: Secondary | ICD-10-CM | POA: Diagnosis not present

## 2012-02-24 DIAGNOSIS — I700 Atherosclerosis of aorta: Secondary | ICD-10-CM

## 2012-02-24 DIAGNOSIS — I10 Essential (primary) hypertension: Secondary | ICD-10-CM | POA: Diagnosis not present

## 2012-02-25 ENCOUNTER — Telehealth: Payer: Self-pay | Admitting: *Deleted

## 2012-02-25 ENCOUNTER — Encounter (INDEPENDENT_AMBULATORY_CARE_PROVIDER_SITE_OTHER): Payer: Medicare Other

## 2012-02-25 DIAGNOSIS — I6529 Occlusion and stenosis of unspecified carotid artery: Secondary | ICD-10-CM | POA: Diagnosis not present

## 2012-02-25 DIAGNOSIS — R0989 Other specified symptoms and signs involving the circulatory and respiratory systems: Secondary | ICD-10-CM

## 2012-02-25 NOTE — Telephone Encounter (Signed)
BP is trending better on amlodipine.  No changes.

## 2012-02-25 NOTE — Telephone Encounter (Signed)
Pt dropped off BP readings. 02/12/12-153/61 54  02/13/12 178/75 51 02/14/12 148/65 53 02/15/12 137/67 58 02/16/12 144/75 56 02/17/12 136/66 54 02/18/12 135/63 52 02/19/12 146/64 54 02/20/12 131/66 57 02/21/12 113/58 60 02/22/12 146/68 52 02/23/12 139/69 53 02/25/12 136/61 60. Pt states on 02/19/12 his PCP prescribed  Flomax 0.4mg  daily  for his prostate. He states about 4 hours after he took it he began to feel bad and was lightheaded. He called 911. He states his BP was 150 systolic when EMS took his BP. Pt states he was told everything checked out. He was not taken to hospital. He has not taken anymore Flomax. He states he had felt fine since then. I will forward to Dr Shirlee Latch for review

## 2012-02-25 NOTE — Telephone Encounter (Signed)
HYPERTENSION - Marca Ancona, MD 02/14/2012 9:00 PM Addendum  BP continues to run high. I am going to start amlodipine 5 mg daily. He has an abdominal bruit so I will also get renal artery dopplers to rule out renal artery stenosis. BP check in 2 wks on amlodipine.   02/25/12--I talked with pt's friend, Talbert Forest. Pt has an appt for a carotid doppler today at 3PM and is going to bring in his BP log for Dr Shirlee Latch to review.

## 2012-02-29 NOTE — Telephone Encounter (Signed)
Discussed with pt

## 2012-03-03 ENCOUNTER — Telehealth: Payer: Self-pay | Admitting: Cardiology

## 2012-03-03 ENCOUNTER — Ambulatory Visit (HOSPITAL_COMMUNITY)
Admission: RE | Admit: 2012-03-03 | Discharge: 2012-03-03 | Disposition: A | Discharge status: Home / Self Care | Payer: Medicare Other | Source: Ambulatory Visit | Attending: Internal Medicine | Admitting: Internal Medicine

## 2012-03-03 DIAGNOSIS — F17200 Nicotine dependence, unspecified, uncomplicated: Secondary | ICD-10-CM | POA: Insufficient documentation

## 2012-03-03 DIAGNOSIS — N400 Enlarged prostate without lower urinary tract symptoms: Secondary | ICD-10-CM | POA: Insufficient documentation

## 2012-03-03 DIAGNOSIS — I2510 Atherosclerotic heart disease of native coronary artery without angina pectoris: Secondary | ICD-10-CM | POA: Insufficient documentation

## 2012-03-03 DIAGNOSIS — J984 Other disorders of lung: Secondary | ICD-10-CM | POA: Diagnosis not present

## 2012-03-03 DIAGNOSIS — I700 Atherosclerosis of aorta: Secondary | ICD-10-CM | POA: Insufficient documentation

## 2012-03-03 DIAGNOSIS — R911 Solitary pulmonary nodule: Secondary | ICD-10-CM | POA: Diagnosis not present

## 2012-03-03 HISTORY — DX: Benign prostatic hyperplasia without lower urinary tract symptoms: N40.0

## 2012-03-03 NOTE — Assessment & Plan Note (Signed)
Pt reports symptoms c/w BPH.  Educated pt about BPH and prostate cancer.  Discussed risks and benefits for PSA screening at length.  Pt does not want to proceed with PSA screening.  Will start tamsulosin for relief of symptoms.  Will f/u on this with him at his next OV.    >25 min with 50% face to face time was spent counseling pt about his chronic medical conditions and medical concerns.

## 2012-03-03 NOTE — Assessment & Plan Note (Signed)
BP stable and within normal limits.  Will not make any changes to his anti-HTN regimen at this point.

## 2012-03-03 NOTE — Telephone Encounter (Signed)
Talked with pt 

## 2012-03-03 NOTE — Assessment & Plan Note (Signed)
Pt tolerating statin.  Will check lipid profile today.

## 2012-03-03 NOTE — Telephone Encounter (Signed)
Please return call to patient at [phone]  Patient call for test results, he can be reached at [phone]

## 2012-03-03 NOTE — Assessment & Plan Note (Signed)
Pt attempting to cut down on the amount he smokes per day; not yet ready to quit.  Reviewed various methods for smoking cessation.  Will f/u on this at his next OV.

## 2012-03-15 ENCOUNTER — Ambulatory Visit (INDEPENDENT_AMBULATORY_CARE_PROVIDER_SITE_OTHER): Payer: Medicare Other | Admitting: Internal Medicine

## 2012-03-15 ENCOUNTER — Encounter: Payer: Self-pay | Admitting: Internal Medicine

## 2012-03-15 VITALS — BP 164/80 | HR 61 | Temp 96.4°F | Ht 69.0 in | Wt 116.1 lb

## 2012-03-15 DIAGNOSIS — N400 Enlarged prostate without lower urinary tract symptoms: Secondary | ICD-10-CM

## 2012-03-15 DIAGNOSIS — R911 Solitary pulmonary nodule: Secondary | ICD-10-CM | POA: Diagnosis not present

## 2012-03-15 DIAGNOSIS — F17200 Nicotine dependence, unspecified, uncomplicated: Secondary | ICD-10-CM

## 2012-03-15 DIAGNOSIS — J984 Other disorders of lung: Secondary | ICD-10-CM | POA: Diagnosis not present

## 2012-03-15 NOTE — Progress Notes (Signed)
Subjective:     Patient ID: Daniel [name], male   DOB: [birth year], 71 y.o.   MRN: [id number]  HPI Patient is a 71 y/o M here today for follow up to discuss his CT scan results.   Advsere reaction to flomax: patient reports experiencing a side effect with Flomax  He notes he stood up from a chair to when he felt like he as going to pass out.  He did not experience any chest pain or heart palpitations.  He called 911 because he was concerned about low blood pressure and concern about his heart; he reports all vital signs were normal when EMS arrived.   Review of Systems Constitutional: Negative for fever, chills, diaphoresis, activity change, appetite change, fatigue and unexpected weight change.  HENT: Negative for hearing loss, congestion and neck stiffness.   Eyes: Negative for photophobia, pain and visual disturbance.  Respiratory: Negative for cough, chest tightness, shortness of breath and wheezing.   Cardiovascular: Negative for chest pain and palpitations.  Gastrointestinal: Negative for abdominal pain, blood in stool and anal bleeding.  Genitourinary: Negative for dysuria, hematuria and difficulty urinating.  Musculoskeletal: Negative for joint swelling.  Neurological: Negative for dizziness, syncope, speech difficulty, weakness, numbness and headaches.      Objective:   Physical Exam VItal signs reviewed and stable.  Blood pressure slightly elevated above goal GEN: No apparent distress.  Alert and oriented x 3.  Pleasant, conversant, and cooperative to exam. HEENT: head is autraumatic and normocephalic.  Neck is supple without palpable masses or lymphadenopathy.  No JVD.  Vision intact.  EOMI.  PERRLA.  Sclerae anicteric.  Conjunctivae without pallor or injection. Mucous membranes are moist.  Oropharynx is without erythema, exudates, or other abnormal lesions. Numerous teeth missing.  RESP:  Lungs are clear to ascultation bilaterally with good air movement.  No wheezes, ronchi, or  rubs. CARDIOVASCULAR: Regular rate, normal rhythm.  Clear S1, S2, no murmurs, gallops, or rubs. EXT: warm and dry. Distal pulses intact.  No  edema in bilateral lower extremities. SKIN: warm and dry with normal turgor.  No rashes or abnormal lesions observed. NEURO: CN II-XII grossly intact.  No focal deficit.    Assessment/Plan:

## 2012-03-17 NOTE — Assessment & Plan Note (Signed)
Recent CT scan reports nodule is stable.  Discussed need for repeat CT scan in November; this will be the two year mark from initial discover of his pulmonary nodule. If there is no change in size or other concerning findings on CT scan in November, no further imaging will be necessary.  Patient is agreeable to repeat CT scan; will place a future order for this.

## 2012-03-17 NOTE — Assessment & Plan Note (Signed)
Discussed importance of smoking cessation reviewed various methods for quitting. Patient is not yet ready to quit smoking. We'll followup on this with him at his next office visit.

## 2012-03-17 NOTE — Assessment & Plan Note (Signed)
The patient developed orthostatic hypotension with Flomax.  He experiences only mild symptoms of BPH; reports weakened stream with some dribbling.  At this time, he does not wish to attempt a second medication for preventative symptoms but will consider this in the future if his symptoms worsen.  Will note adverse reaction to Flomax in chart.

## 2012-03-28 ENCOUNTER — Other Ambulatory Visit (INDEPENDENT_AMBULATORY_CARE_PROVIDER_SITE_OTHER): Payer: Medicare Other

## 2012-03-28 DIAGNOSIS — R0989 Other specified symptoms and signs involving the circulatory and respiratory systems: Secondary | ICD-10-CM

## 2012-03-28 DIAGNOSIS — I10 Essential (primary) hypertension: Secondary | ICD-10-CM

## 2012-03-28 LAB — LIPID PANEL
Cholesterol: 121 mg/dL (ref 0–200)
HDL: 52.4 mg/dL (ref >39.00)
LDL Cholesterol: 59 mg/dL (ref 0–99)
Total CHOL/HDL Ratio: 2
Triglycerides: 49 mg/dL (ref 0.0–149.0)
VLDL: 9.8 mg/dL (ref 0.0–40.0)

## 2012-03-28 LAB — HEPATIC FUNCTION PANEL
ALT: 17 U/L (ref 0–53)
AST: 20 U/L (ref 0–37)
Albumin: 3.9 g/dL (ref 3.5–5.2)
Alkaline Phosphatase: 63 U/L (ref 39–117)
Bilirubin, Direct: 0.1 mg/dL (ref 0.0–0.3)
Total Bilirubin: 0.2 mg/dL — ABNORMAL LOW (ref 0.3–1.2)
Total Protein: 6.6 g/dL (ref 6.0–8.3)

## 2012-04-04 ENCOUNTER — Telehealth: Payer: Self-pay | Admitting: Cardiology

## 2012-04-04 NOTE — Telephone Encounter (Signed)
Pt rtn call to anne, pls call °

## 2012-04-04 NOTE — Telephone Encounter (Signed)
Spoke with pt about recent lab results 

## 2012-05-04 ENCOUNTER — Other Ambulatory Visit: Payer: Self-pay | Admitting: *Deleted

## 2012-05-04 ENCOUNTER — Other Ambulatory Visit: Payer: Self-pay | Admitting: Internal Medicine

## 2012-05-04 MED ORDER — FUROSEMIDE 20 MG PO TABS: 20.0000 mg | ORAL_TABLET | Freq: Every day | ORAL | Status: DC

## 2012-05-17 ENCOUNTER — Ambulatory Visit (INDEPENDENT_AMBULATORY_CARE_PROVIDER_SITE_OTHER): Payer: Medicare Other | Admitting: Internal Medicine

## 2012-05-17 ENCOUNTER — Encounter: Payer: Self-pay | Admitting: Internal Medicine

## 2012-05-17 VITALS — BP 160/67 | HR 57 | Temp 98.0°F | Ht 69.0 in | Wt 113.5 lb

## 2012-05-17 DIAGNOSIS — I4891 Unspecified atrial fibrillation: Secondary | ICD-10-CM | POA: Diagnosis not present

## 2012-05-17 DIAGNOSIS — D539 Nutritional anemia, unspecified: Secondary | ICD-10-CM | POA: Diagnosis not present

## 2012-05-17 DIAGNOSIS — E538 Deficiency of other specified B group vitamins: Secondary | ICD-10-CM | POA: Diagnosis not present

## 2012-05-17 DIAGNOSIS — I10 Essential (primary) hypertension: Secondary | ICD-10-CM | POA: Diagnosis not present

## 2012-05-17 DIAGNOSIS — F17200 Nicotine dependence, unspecified, uncomplicated: Secondary | ICD-10-CM | POA: Diagnosis not present

## 2012-05-17 DIAGNOSIS — J984 Other disorders of lung: Secondary | ICD-10-CM | POA: Diagnosis not present

## 2012-05-17 LAB — CBC
HCT: 38.4 % — ABNORMAL LOW (ref 39.0–52.0)
Hemoglobin: 12.6 g/dL — ABNORMAL LOW (ref 13.0–17.0)
MCH: 32.8 pg (ref 26.0–34.0)
MCHC: 32.8 g/dL (ref 30.0–36.0)
MCV: 100 fL (ref 78.0–100.0)
Platelets: 256 10*3/uL (ref 150–400)
RBC: 3.84 MIL/uL — ABNORMAL LOW (ref 4.22–5.81)
RDW: 14.1 % (ref 11.5–15.5)
WBC: 7.2 10*3/uL (ref 4.0–10.5)

## 2012-05-17 LAB — BASIC METABOLIC PANEL
BUN: 12 mg/dL (ref 6–23)
CO2: 29 mEq/L (ref 19–32)
Calcium: 10.5 mg/dL (ref 8.4–10.5)
Chloride: 104 mEq/L (ref 96–112)
Creat: 1.26 mg/dL (ref 0.50–1.35)
Glucose, Bld: 71 mg/dL (ref 70–99)
Potassium: 4.5 mEq/L (ref 3.5–5.3)
Sodium: 141 mEq/L (ref 135–145)

## 2012-05-17 MED ORDER — METOPROLOL SUCCINATE ER 50 MG PO TB24: 75.0000 mg | ORAL_TABLET | Freq: Every day | ORAL | Status: DC

## 2012-05-17 MED ORDER — AMIODARONE HCL 200 MG PO TABS: 200.0000 mg | ORAL_TABLET | Freq: Every day | ORAL | Status: DC

## 2012-05-17 MED ORDER — ENALAPRIL MALEATE 10 MG PO TABS: 10.0000 mg | ORAL_TABLET | Freq: Two times a day (BID) | ORAL | Status: DC

## 2012-05-17 MED ORDER — PANTOPRAZOLE SODIUM 40 MG PO TBEC: 40.0000 mg | DELAYED_RELEASE_TABLET | Freq: Two times a day (BID) | ORAL | Status: DC

## 2012-05-17 MED ORDER — ATORVASTATIN CALCIUM 20 MG PO TABS: 20.0000 mg | ORAL_TABLET | Freq: Every day | ORAL | Status: DC

## 2012-05-17 MED ORDER — AMLODIPINE BESYLATE 5 MG PO TABS: 5.0000 mg | ORAL_TABLET | Freq: Every day | ORAL | Status: DC

## 2012-05-17 NOTE — Assessment & Plan Note (Signed)
Patient has a history of B12 deficiency. Will obtain CBC today to evaluate hemoglobin and MCV.

## 2012-05-17 NOTE — Progress Notes (Signed)
Patient ID: Daniel [name], male   DOB: 01/06/1941, 71 y.o.   MRN: [id number]  Subjective:    HPI Patient is a 71 y/o M here today for routine follow up.  HTN: BP elevated above goal today.  Pt states he checked his BP at home before his OV and reports a SBP of 125.  On the evening before his OV, he reports his BP was 107 on his cuff and 127 using his mother's BP cuff.  He notes that his home blood pressure readings are consistently below those obtained at his office visits. He is tolerating all of his medications without adverse effect.  He requests that all of his medications be prescribed with a 90 day supply. He has no other concerns or complaints today.  Review of Systems Constitutional: Negative for fever, chills, diaphoresis, activity change, appetite change, fatigue and unexpected weight change.  HENT: Negative for hearing loss, congestion and neck stiffness.   Eyes: Negative for photophobia, pain and visual disturbance.  Respiratory: Negative for cough, chest tightness, shortness of breath and wheezing.   Cardiovascular: Negative for chest pain and palpitations.  Gastrointestinal: Negative for abdominal pain, blood in stool and anal bleeding.  Genitourinary: Negative for dysuria, hematuria and difficulty urinating.  Musculoskeletal: Negative for joint swelling.  Neurological: Negative for dizziness, syncope, speech difficulty, weakness, numbness and headaches.      Objective:   Physical Exam Vital signs reviewed  GEN: No apparent distress.  Alert and oriented x 3.  Pleasant, conversant, and cooperative to exam. HEENT: head is autraumatic and normocephalic.  Neck is supple without palpable masses or lymphadenopathy.  No JVD.  Left carotid bruit noted. Vision intact.  EOMI.  PERRLA.  Sclerae anicteric.  Conjunctivae without pallor or injection. Mucous membranes are moist.  Oropharynx is without erythema, exudates, or other abnormal lesions. Numerous teeth missing.   RESP:  Lungs are  clear to ascultation bilaterally with good air movement.  No wheezes, ronchi, or rubs. CARDIOVASCULAR: Regular rate, normal rhythm.  Clear S1, S2, no murmurs, gallops, or rubs. EXT: warm and dry. Distal pulses intact.  No  edema in bilateral lower extremities. SKIN: warm and dry with normal turgor.  No rashes or abnormal lesions observed. NEURO: CN II-XII grossly intact.  No focal deficit.    Assessment/Plan:

## 2012-05-17 NOTE — Assessment & Plan Note (Signed)
Patient will be due for repeat CT scan in November 2013. This order has been placed in [facility] and patient is aware of need for scan.  If his nodule remains unchanged, he will need no further work up as November will be the 2 year mark.

## 2012-05-17 NOTE — Assessment & Plan Note (Signed)
Patient is doing well on his current medication regimen included metoprolol and amiodarone. TSH was within normal limits when last checked; we'll plan for repeat TSH at 6-12 months or sooner if patient develops symptoms.

## 2012-05-17 NOTE — Assessment & Plan Note (Signed)
Will check CBC today.  

## 2012-05-17 NOTE — Assessment & Plan Note (Signed)
Patient continues to smoke.   Reviewed the importance of smoking cessation encouraged patient to quit. He is not yet ready to quit.  We'll continue to encourage smoking cessation and at his office visits.

## 2012-05-17 NOTE — Patient Instructions (Signed)
Schedule a followup appointment in 3-4 months with your new primary care doctor. Continue to take all of your medicines as directed. You will need a repeat CT scan (cat scan) of your chest in November 2013 to check on your lung nodule.  Hopefully, this will be the last scan you will need for this. Bring your book of home blood pressure readings with you to your next appointment I will call you if any of your blood work is not normal

## 2012-05-17 NOTE — Assessment & Plan Note (Signed)
Patient's blood pressure remains elevated above goal. I believe he suffers from some degree of white coat hypertension.  His home blood pressure readings were recorded by cardiology and reveal SBPs averaging in the 130-140s.  I believe an SBP of 130-135 is acceptable for Daniel [name] given his age.  Will continue his current medication regimen and check a BMET today to assess electrolyte status and renal function.  Asked patient to bring his home BP log with him to his next OV.

## 2012-05-19 ENCOUNTER — Telehealth: Payer: Self-pay | Admitting: Cardiology

## 2012-05-19 NOTE — Telephone Encounter (Signed)
New Problem:       I called the patient to reschedule their appointment with Dr. Mclean on 05/31/12 because he will be out of the office, and was only able to leave a message for him to call back to reschedule.  

## 2012-05-31 ENCOUNTER — Ambulatory Visit: Payer: Medicare Other | Admitting: Cardiology

## 2012-06-22 ENCOUNTER — Encounter: Payer: Self-pay | Admitting: Cardiology

## 2012-06-22 ENCOUNTER — Ambulatory Visit (INDEPENDENT_AMBULATORY_CARE_PROVIDER_SITE_OTHER): Payer: Medicare Other | Admitting: Cardiology

## 2012-06-22 VITALS — BP 138/84 | HR 60 | Ht 69.0 in | Wt 111.0 lb

## 2012-06-22 DIAGNOSIS — I429 Cardiomyopathy, unspecified: Secondary | ICD-10-CM

## 2012-06-22 DIAGNOSIS — E785 Hyperlipidemia, unspecified: Secondary | ICD-10-CM

## 2012-06-22 DIAGNOSIS — I10 Essential (primary) hypertension: Secondary | ICD-10-CM | POA: Diagnosis not present

## 2012-06-22 DIAGNOSIS — R0989 Other specified symptoms and signs involving the circulatory and respiratory systems: Secondary | ICD-10-CM

## 2012-06-22 DIAGNOSIS — I4891 Unspecified atrial fibrillation: Secondary | ICD-10-CM | POA: Diagnosis not present

## 2012-06-22 DIAGNOSIS — F17200 Nicotine dependence, unspecified, uncomplicated: Secondary | ICD-10-CM

## 2012-06-22 DIAGNOSIS — I739 Peripheral vascular disease, unspecified: Secondary | ICD-10-CM

## 2012-06-22 DIAGNOSIS — I428 Other cardiomyopathies: Secondary | ICD-10-CM

## 2012-06-22 LAB — HEPATIC FUNCTION PANEL
ALT: 11 U/L (ref 0–53)
AST: 16 U/L (ref 0–37)
Albumin: 4 g/dL (ref 3.5–5.2)
Alkaline Phosphatase: 61 U/L (ref 39–117)
Bilirubin, Direct: 0.1 mg/dL (ref 0.0–0.3)
Total Bilirubin: 0.8 mg/dL (ref 0.3–1.2)
Total Protein: 7 g/dL (ref 6.0–8.3)

## 2012-06-22 LAB — BASIC METABOLIC PANEL
BUN: 16 mg/dL (ref 6–23)
CO2: 25 mEq/L (ref 19–32)
Calcium: 10.2 mg/dL (ref 8.4–10.5)
Chloride: 107 mEq/L (ref 96–112)
Creatinine, Ser: 1.3 mg/dL (ref 0.4–1.5)
GFR: 71.91 mL/min (ref >60.00)
Glucose, Bld: 100 mg/dL — ABNORMAL HIGH (ref 70–99)
Potassium: 3.8 mEq/L (ref 3.5–5.1)
Sodium: 140 mEq/L (ref 135–145)

## 2012-06-22 LAB — TSH: TSH: 0.63 u[IU]/mL (ref 0.35–5.50)

## 2012-06-22 MED ORDER — CILOSTAZOL 100 MG PO TABS: 100.0000 mg | ORAL_TABLET | Freq: Two times a day (BID) | ORAL | Status: DC

## 2012-06-22 NOTE — Patient Instructions (Addendum)
Start Pletal 100 mg twice a day   Schedule appointment wit Dr.Arida   Start Nicotine 14 mg patch you have to ask for at your drug store follow directions will help you stop smoking   Your physician recommends that you schedule a follow-up appointment in: 1 month

## 2012-06-24 NOTE — Assessment & Plan Note (Signed)
Patient is in NSR today.  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  On Pradaxa for anticoagulation with stable creatinine when last checked.  Will try to maintain NSR with amiodarone.  PFTs on amiodarone were ok.  Will need yearly eye exam. LFTs and TSH today.

## 2012-06-24 NOTE — Assessment & Plan Note (Signed)
LDL has been at goal (<70).  

## 2012-06-24 NOTE — Assessment & Plan Note (Signed)
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  EF back to 55-60% by last echo with maintenance of NSR with amiodarone.  He appears euvolemic.  Continue Toprol XL and enalapril.

## 2012-06-24 NOTE — Assessment & Plan Note (Signed)
Only minimal disease on recent carotid dopplers.

## 2012-06-24 NOTE — Assessment & Plan Note (Signed)
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  This has gotten worse and is bothering him a lot now.  He is still smoking.   He is going to try to quit.  He has been euvolemic with no CHF symptoms and EF back to normal since he has been in NSR.  Therefore, I think that it would be ok for him to try cilostazol 100 mg bid.  Will need to be careful as this may increase bleeding risk on Pradaxa.  Better long-term solution may be PCI, so I will have him see Dr. Kirke Corin for [facility] consult.

## 2012-06-24 NOTE — Assessment & Plan Note (Signed)
Needs to quit.  I strongly encouraged him again today.  He is considering trying nicotine patches.
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
## 2012-06-29 ENCOUNTER — Encounter: Payer: Self-pay | Admitting: Cardiovascular Disease

## 2012-06-29 ENCOUNTER — Ambulatory Visit (INDEPENDENT_AMBULATORY_CARE_PROVIDER_SITE_OTHER): Payer: Medicare Other | Admitting: Cardiovascular Disease

## 2012-06-29 VITALS — BP 140/70 | HR 69 | Resp 18 | Ht 69.0 in | Wt 112.0 lb

## 2012-06-29 DIAGNOSIS — I739 Peripheral vascular disease, unspecified: Secondary | ICD-10-CM | POA: Diagnosis not present

## 2012-06-29 DIAGNOSIS — Z01812 Encounter for preprocedural laboratory examination: Secondary | ICD-10-CM

## 2012-06-29 DIAGNOSIS — I4891 Unspecified atrial fibrillation: Secondary | ICD-10-CM
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
## 2012-06-29 NOTE — Patient Instructions (Addendum)
Your physician recommends that you return for lab work in: 07/07/12 (bmet, cbc, pt/inr)  You are scheduled for an abdominal aortogram on 07/13/12.  Please see your instruction letter for instructions

## 2012-06-29 NOTE — Assessment & Plan Note (Signed)
The patient has known history of bilateral common iliac stenosis worse on the left side with significant lifestyle limiting bilateral calf claudication. His symptoms have progressed over the last 6 months. Unfortunately, he did not tolerate treatment with Pletal and also has not been able to quit smoking. I discussed management options with him and given the severity of his symptoms, the best route is likely proceeding with angiography and possible endovascular intervention. Risks, benefits and alternatives were discussed with the patient. I asked him to hold Pradaxa 2 days before the procedure.

## 2012-06-30 ENCOUNTER — Encounter (HOSPITAL_COMMUNITY): Payer: Self-pay | Admitting: Pharmacy Technician

## 2012-07-01 ENCOUNTER — Other Ambulatory Visit: Payer: Self-pay | Admitting: Cardiology

## 2012-07-06 ENCOUNTER — Ambulatory Visit: Payer: Medicare Other | Admitting: *Deleted

## 2012-07-06 DIAGNOSIS — E785 Hyperlipidemia, unspecified: Secondary | ICD-10-CM | POA: Diagnosis not present

## 2012-07-06 DIAGNOSIS — I10 Essential (primary) hypertension: Secondary | ICD-10-CM | POA: Diagnosis not present

## 2012-07-06 DIAGNOSIS — I079 Rheumatic tricuspid valve disease, unspecified: Secondary | ICD-10-CM | POA: Diagnosis not present

## 2012-07-06 DIAGNOSIS — I739 Peripheral vascular disease, unspecified: Secondary | ICD-10-CM

## 2012-07-06 LAB — CBC WITH DIFFERENTIAL/PLATELET
Basophils Absolute: 0.1 10*3/uL (ref 0.0–0.1)
Basophils Relative: 1 % (ref 0–1)
Eosinophils Absolute: 0.1 10*3/uL (ref 0.0–0.7)
Eosinophils Relative: 2 % (ref 0–5)
HCT: 35.2 % — ABNORMAL LOW (ref 39.0–52.0)
Hemoglobin: 11.8 g/dL — ABNORMAL LOW (ref 13.0–17.0)
Lymphocytes Relative: 22 % (ref 12–46)
Lymphs Abs: 1.6 10*3/uL (ref 0.7–4.0)
MCH: 33.6 pg (ref 26.0–34.0)
MCHC: 33.5 g/dL (ref 30.0–36.0)
MCV: 100.3 fL — ABNORMAL HIGH (ref 78.0–100.0)
Monocytes Absolute: 0.5 10*3/uL (ref 0.1–1.0)
Monocytes Relative: 6 % (ref 3–12)
Neutro Abs: 4.8 10*3/uL (ref 1.7–7.7)
Neutrophils Relative %: 69 % (ref 43–77)
Platelets: 254 10*3/uL (ref 150–400)
RBC: 3.51 MIL/uL — ABNORMAL LOW (ref 4.22–5.81)
RDW: 13.6 % (ref 11.5–15.5)
WBC: 7 10*3/uL (ref 4.0–10.5)

## 2012-07-06 LAB — BASIC METABOLIC PANEL
BUN: 14 mg/dL (ref 6–23)
CO2: 28 mEq/L (ref 19–32)
Calcium: 10 mg/dL (ref 8.4–10.5)
Chloride: 107 mEq/L (ref 96–112)
Creat: 1.21 mg/dL (ref 0.50–1.35)
Glucose, Bld: 110 mg/dL — ABNORMAL HIGH (ref 70–99)
Potassium: 4.2 mEq/L (ref 3.5–5.3)
Sodium: 141 mEq/L (ref 135–145)

## 2012-07-06 LAB — PROTIME-INR
INR: 1.28 (ref <1.50)
Prothrombin Time: 16.5 seconds — ABNORMAL HIGH (ref 11.6–15.2)

## 2012-07-12 ENCOUNTER — Other Ambulatory Visit: Payer: Self-pay | Admitting: Cardiovascular Disease

## 2012-07-12 DIAGNOSIS — I739 Peripheral vascular disease, unspecified: Secondary | ICD-10-CM

## 2012-07-13 ENCOUNTER — Ambulatory Visit (HOSPITAL_COMMUNITY)
Admission: RE | Admit: 2012-07-13 | Discharge: 2012-07-13 | Disposition: A | Discharge status: Home / Self Care | Payer: Medicare Other | Source: Ambulatory Visit | Attending: Cardiovascular Disease | Admitting: Cardiovascular Disease

## 2012-07-13 ENCOUNTER — Encounter (HOSPITAL_COMMUNITY)
Admission: RE | Disposition: A | Discharge status: Home / Self Care | Payer: Self-pay | Source: Ambulatory Visit | Attending: Cardiovascular Disease

## 2012-07-13 DIAGNOSIS — F17200 Nicotine dependence, unspecified, uncomplicated: Secondary | ICD-10-CM | POA: Diagnosis not present

## 2012-07-13 DIAGNOSIS — I739 Peripheral vascular disease, unspecified: Secondary | ICD-10-CM | POA: Insufficient documentation

## 2012-07-13 DIAGNOSIS — I4891 Unspecified atrial fibrillation: Secondary | ICD-10-CM | POA: Insufficient documentation

## 2012-07-13 DIAGNOSIS — I708 Atherosclerosis of other arteries: Secondary | ICD-10-CM | POA: Insufficient documentation

## 2012-07-13 DIAGNOSIS — I70219 Atherosclerosis of native arteries of extremities with intermittent claudication, unspecified extremity: Secondary | ICD-10-CM | POA: Diagnosis not present

## 2012-07-13 DIAGNOSIS — I10 Essential (primary) hypertension: Secondary | ICD-10-CM | POA: Diagnosis not present

## 2012-07-13 DIAGNOSIS — I700 Atherosclerosis of aorta: Secondary | ICD-10-CM | POA: Insufficient documentation

## 2012-07-13 DIAGNOSIS — M1990 Unspecified osteoarthritis, unspecified site: Secondary | ICD-10-CM | POA: Diagnosis not present

## 2012-07-13 DIAGNOSIS — E785 Hyperlipidemia, unspecified: Secondary | ICD-10-CM | POA: Diagnosis not present

## 2012-07-13 DIAGNOSIS — I428 Other cardiomyopathies: Secondary | ICD-10-CM | POA: Insufficient documentation

## 2012-07-13 HISTORY — PX: ABDOMINAL AORTAGRAM: SHX5454

## 2012-07-13 LAB — POCT ACTIVATED CLOTTING TIME
Activated Clotting Time: 219 seconds
Activated Clotting Time: 170 seconds
Activated Clotting Time: 184 seconds

## 2012-07-13 SURGERY — ABDOMINAL AORTAGRAM
Anesthesia: LOCAL

## 2012-07-13 MED ORDER — CLOPIDOGREL BISULFATE 75 MG PO TABS
300.0000 mg | ORAL_TABLET | Freq: Once | ORAL | Status: AC
  Administered 2012-07-13: 300 mg via ORAL

## 2012-07-13 MED ORDER — SODIUM CHLORIDE 0.9 % IV SOLN
INTRAVENOUS | Status: DC
  Administered 2012-07-13: 07:00:00 via INTRAVENOUS

## 2012-07-13 MED ORDER — HEPARIN SODIUM (PORCINE) 1000 UNIT/ML IJ SOLN
INTRAMUSCULAR | Status: AC
  Filled 2012-07-13: qty 1

## 2012-07-13 MED ORDER — OXYCODONE-ACETAMINOPHEN 5-325 MG PO TABS
ORAL_TABLET | ORAL | Status: AC
  Filled 2012-07-13: qty 2

## 2012-07-13 MED ORDER — SODIUM CHLORIDE 0.9 % IJ SOLN: 3.0000 mL | Freq: Two times a day (BID) | INTRAMUSCULAR | Status: DC

## 2012-07-13 MED ORDER — HEPARIN (PORCINE) IN NACL 2-0.9 UNIT/ML-% IJ SOLN
INTRAMUSCULAR | Status: AC
  Filled 2012-07-13: qty 1000

## 2012-07-13 MED ORDER — FENTANYL CITRATE 0.05 MG/ML IJ SOLN
INTRAMUSCULAR | Status: AC
  Filled 2012-07-13: qty 2

## 2012-07-13 MED ORDER — MIDAZOLAM HCL 2 MG/2ML IJ SOLN
INTRAMUSCULAR | Status: AC
  Filled 2012-07-13: qty 2

## 2012-07-13 MED ORDER — MORPHINE SULFATE 4 MG/ML IJ SOLN
2.0000 mg | INTRAMUSCULAR | Status: DC | PRN
  Administered 2012-07-13: 2 mg via INTRAVENOUS

## 2012-07-13 MED ORDER — SODIUM CHLORIDE 0.9 % IV SOLN: INTRAVENOUS | Status: AC

## 2012-07-13 MED ORDER — NITROGLYCERIN IN D5W 200-5 MCG/ML-% IV SOLN
INTRAVENOUS | Status: AC
  Filled 2012-07-13: qty 250

## 2012-07-13 MED ORDER — ASPIRIN 81 MG PO CHEW
CHEWABLE_TABLET | ORAL | Status: AC
  Filled 2012-07-13: qty 4

## 2012-07-13 MED ORDER — ACETAMINOPHEN 325 MG PO TABS: 650.0000 mg | ORAL_TABLET | ORAL | Status: DC | PRN

## 2012-07-13 MED ORDER — ONDANSETRON HCL 4 MG/2ML IJ SOLN: 4.0000 mg | Freq: Four times a day (QID) | INTRAMUSCULAR | Status: DC | PRN

## 2012-07-13 MED ORDER — OXYCODONE-ACETAMINOPHEN 5-325 MG PO TABS
2.0000 | ORAL_TABLET | Freq: Once | ORAL | Status: AC
  Administered 2012-07-13: 2 via ORAL

## 2012-07-13 MED ORDER — MORPHINE SULFATE 4 MG/ML IJ SOLN
INTRAMUSCULAR | Status: AC
  Filled 2012-07-13: qty 1

## 2012-07-13 MED ORDER — LIDOCAINE HCL (PF) 1 % IJ SOLN
INTRAMUSCULAR | Status: AC
  Filled 2012-07-13: qty 30

## 2012-07-13 MED ORDER — ASPIRIN 81 MG PO CHEW
324.0000 mg | CHEWABLE_TABLET | ORAL | Status: AC
  Administered 2012-07-13: 324 mg via ORAL

## 2012-07-13 MED ORDER — CLOPIDOGREL BISULFATE 300 MG PO TABS
ORAL_TABLET | ORAL | Status: AC
  Filled 2012-07-13: qty 1

## 2012-07-13 MED ORDER — SODIUM CHLORIDE 0.9 % IJ SOLN: 3.0000 mL | INTRAMUSCULAR | Status: DC | PRN

## 2012-07-13 MED ORDER — SODIUM CHLORIDE 0.9 % IV SOLN: 250.0000 mL | INTRAVENOUS | Status: DC | PRN

## 2012-07-13 NOTE — Interval H&P Note (Signed)
History and Physical Interval Note:  07/13/2012 8:37 AM  Daniel [name]  has presented today for surgery, with the diagnosis of PVD  The various methods of treatment have been discussed with the patient and family. After consideration of risks, benefits and other options for treatment, the patient has consented to  Procedure(s) (LRB): ABDOMINAL AORTAGRAM (N/A) as a surgical intervention .  The patient's history has been reviewed, patient examined, no change in status, stable for surgery.  I have reviewed the patient's chart and labs.  Questions were answered to the patient's satisfaction.     Lorine Bears
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
## 2012-07-13 NOTE — CV Procedure (Signed)
PERIPHERAL VASCULAR PROCEDURE  NAME:  Daniel [name]   MRN: [id number] DOB:  10-01-1941   ADMIT DATE: 07/13/2012  Performing Cardiologist: Lorine Bears Primary Physician: Rocco Serene, MD Primary Cardiologist:  Dr. Shirlee Latch  Procedures Performed:  Abdominal Aortic Angiogram with Bi-Iliofemoral Runoff  Bilateral Lower Extremity Angiography (1st Order)  Left common iliac artery angioplasty.  Left common iliac stent placement  Indication(s):  Claudication   Consent: The procedure with Risks/Benefits/Alternatives and Indications was reviewed with the patient .  All questions were answered.  Medications:  Sedation:  1 mg IV Versed, 75 mcg IV Fentanyl  Contrast:  175 ml Omnipaque  Procedural details: The right groin was prepped, draped, and anesthetized with 1% lidocaine. Using modified Seldinger technique, a 5 French sheath was introduced into the right common femoral artery. A 5 Fr Short Pigtail Catheter was advanced of over a  Versicore wire into the descending Aorta to a level just above the renal arteries. A power injection of 68ml/sec contrast over 1 sec was performed for Abdominal Aortic Angiography.  The catheter was then pulled back to a level just above the Aortic bifurcation, and a second power injection was performed to evaluate bi-ileiofemoral arteries with runnoff. The pigtail catheter was then removed. A straight tip catheter was advanced to the distal aorta to perform pull back across the right common iliac artery. 200 mcg of nitroglycerin was given.   Interventional Procedure:  The left groin was prepped and anesthetized with 1% lidocaine. A 6 French Bright tip catheter was introduced into the left common femoral artery. 2000 units of unfractionated heparin was given. The lesion in the left common iliac artery was predilated with a 5 x 30 mm balloon. Angiography showed mild residual stenosis with localized dissection. Thus, I decided to stent the lesion with a 7  x 29 mm Genesis balloon expandable stent which was deployed to 8 atmospheres. Angiography showed excellent results with tiny dissection noted distally which was not flow-limiting. An improvement in the pressure was noted with increase in systolic pressure by 20 mm.   Hemodynamics:  Central Aortic Pressure / Mean Aortic Pressure:  138/55.   Findings:  Abdominal aorta:  mild diffuse disease with narrowing distally. No aneurysm.  Left renal artery:  2 left renal arteries with mild atherosclerosis.  Right renal artery: To her right renal arteries with 20% proximal stenosis in the lower 1.   Celiac artery:  not well visualized.  Superior mesenteric artery:  patent.   Right common iliac artery:  diffuse 50% disease with no significant gradient noted upon pullback.   Right internal iliac artery:  occluded.   Right external iliac artery:  mild diffuse disease.   Right common femoral artery:  90% disease leading into the profunda.   Right profunda femoral artery:  minor irregularities and gives collaterals to the occluded proximal SFA.   Right superficial femoral artery: occluded at its origin with reconstitution in the midsegment from collaterals from the profunda. The distal SFA has mild diffuse atherosclerosis.   Right popliteal artery:  minor irregularities.   Right tibial peroneal trunk:  mild diffuse atherosclerosis.   Right anterior tibial artery:  occluded proximally.  Right peroneal artery: mild diffuse atherosclerosis.   Right posterior tibial artery:  mild diffuse atherosclerosis.   Left common iliac artery:   80% ulcerated lesion.  Left internal iliac artery:  small with minor irregularities.  Left external iliac artery:  mild diffuse atherosclerosis.  Left common femoral artery:  minor irregularities.  Left profunda femoral artery:  normal.   Left superficial femoral artery:   diffuse 50% disease in the proximal and midsegment.   Left popliteal artery:  minor  irregularities.   Left tibial peroneal trunk:  minor irregularities.   Left anterior tibial artery:  occluded proximally.  Left peroneal artery:  mild atherosclerosis.   Left posterior tibial artery: Mild diffuse disease.   Conclusions:  1. Significant left common iliac stenosis. Moderate right common iliac artery stenosis which was not significant by pullback. 2. Severe right common femoral artery stenosis with occluded right SFA proximally with reconstitution in the midsegment from collaterals coming from the profunda. Two-vessel runoff on both sides. 3. Successful angioplasty and stent placement to the left common iliac artery.  Recommendations:   resume Pradaxa in 4 days. Start Plavix 75 mg once daily. The right common femoral artery stenosis likely need treatment with atherectomy but will have to approach it from the left groin.   Lorine Bears, MD, [facility] 07/13/2012 9:58 AM

## 2012-07-16 ENCOUNTER — Encounter: Payer: Self-pay | Admitting: Internal Medicine

## 2012-07-25 ENCOUNTER — Other Ambulatory Visit: Payer: Self-pay | Admitting: Cardiology

## 2012-07-25 DIAGNOSIS — I739 Peripheral vascular disease, unspecified: Secondary | ICD-10-CM

## 2012-07-28 ENCOUNTER — Ambulatory Visit: Payer: Medicare Other | Admitting: Cardiology

## 2012-07-28 ENCOUNTER — Encounter (HOSPITAL_COMMUNITY): Payer: Self-pay

## 2012-07-28 ENCOUNTER — Emergency Department (HOSPITAL_COMMUNITY)
Admission: EM | Admit: 2012-07-28 | Discharge: 2012-07-28 | Disposition: A | Discharge status: Home / Self Care | Payer: Medicare Other | Attending: Emergency Medicine | Admitting: Emergency Medicine

## 2012-07-28 DIAGNOSIS — R040 Epistaxis: Secondary | ICD-10-CM | POA: Diagnosis not present

## 2012-07-28 DIAGNOSIS — F17200 Nicotine dependence, unspecified, uncomplicated: Secondary | ICD-10-CM | POA: Insufficient documentation

## 2012-07-28 DIAGNOSIS — I509 Heart failure, unspecified: Secondary | ICD-10-CM | POA: Insufficient documentation

## 2012-07-28 DIAGNOSIS — Z7902 Long term (current) use of antithrombotics/antiplatelets: Secondary | ICD-10-CM | POA: Diagnosis not present

## 2012-07-28 DIAGNOSIS — M1990 Unspecified osteoarthritis, unspecified site: Secondary | ICD-10-CM | POA: Diagnosis not present

## 2012-07-28 DIAGNOSIS — E785 Hyperlipidemia, unspecified: Secondary | ICD-10-CM | POA: Insufficient documentation

## 2012-07-28 DIAGNOSIS — I10 Essential (primary) hypertension: Secondary | ICD-10-CM | POA: Insufficient documentation

## 2012-07-28 LAB — CBC WITH DIFFERENTIAL/PLATELET
Basophils Absolute: 0 10*3/uL (ref 0.0–0.1)
Basophils Relative: 1 % (ref 0–1)
Eosinophils Absolute: 0.1 10*3/uL (ref 0.0–0.7)
Eosinophils Relative: 2 % (ref 0–5)
HCT: 37.2 % — ABNORMAL LOW (ref 39.0–52.0)
Hemoglobin: 12.6 g/dL — ABNORMAL LOW (ref 13.0–17.0)
Lymphocytes Relative: 16 % (ref 12–46)
Lymphs Abs: 1.4 10*3/uL (ref 0.7–4.0)
MCH: 34.1 pg — ABNORMAL HIGH (ref 26.0–34.0)
MCHC: 33.9 g/dL (ref 30.0–36.0)
MCV: 100.5 fL — ABNORMAL HIGH (ref 78.0–100.0)
Monocytes Absolute: 0.4 10*3/uL (ref 0.1–1.0)
Monocytes Relative: 4 % (ref 3–12)
Neutro Abs: 6.7 10*3/uL (ref 1.7–7.7)
Neutrophils Relative %: 78 % — ABNORMAL HIGH (ref 43–77)
Platelets: 261 10*3/uL (ref 150–400)
RBC: 3.7 MIL/uL — ABNORMAL LOW (ref 4.22–5.81)
RDW: 13.4 % (ref 11.5–15.5)
WBC: 8.6 10*3/uL (ref 4.0–10.5)

## 2012-07-28 LAB — COMPREHENSIVE METABOLIC PANEL
ALT: 9 U/L (ref 0–53)
AST: 16 U/L (ref 0–37)
Albumin: 3.8 g/dL (ref 3.5–5.2)
Alkaline Phosphatase: 79 U/L (ref 39–117)
BUN: 10 mg/dL (ref 6–23)
CO2: 28 mEq/L (ref 19–32)
Calcium: 10.6 mg/dL — ABNORMAL HIGH (ref 8.4–10.5)
Chloride: 108 mEq/L (ref 96–112)
Creatinine, Ser: 1.2 mg/dL (ref 0.50–1.35)
GFR calc Af Amer: 68 mL/min — ABNORMAL LOW (ref >90)
GFR calc non Af Amer: 59 mL/min — ABNORMAL LOW (ref >90)
Glucose, Bld: 97 mg/dL (ref 70–99)
Potassium: 3.8 mEq/L (ref 3.5–5.1)
Sodium: 143 mEq/L (ref 135–145)
Total Bilirubin: 0.3 mg/dL (ref 0.3–1.2)
Total Protein: 6.8 g/dL (ref 6.0–8.3)

## 2012-07-28 LAB — APTT: aPTT: 49 seconds — ABNORMAL HIGH (ref 24–37)

## 2012-07-28 LAB — PROTIME-INR
INR: 1.34 (ref 0.00–1.49)
Prothrombin Time: 16.8 seconds — ABNORMAL HIGH (ref 11.6–15.2)

## 2012-07-28 NOTE — ED Notes (Signed)
Pt reports (R) side nose bleeding started yesterday, increase bleeding if he lays on (R) side, increase bleeding when lying flat, pt reports "huge blood clots," pt reports (R) side head "tightness" yesterday, reports tightness relieved after his nose started bleeding

## 2012-07-28 NOTE — ED Notes (Signed)
No neuro deficits noted, perrla, pt reports taking 2 types of blood thinners

## 2012-07-28 NOTE — ED Provider Notes (Signed)
Medical screening examination/treatment/procedure(s) were conducted as a shared visit with non-physician practitioner(s) and myself.  I personally evaluated the patient during the encounter  minimla bleeding of nasal mucosa, no site to be cauterized--will check labs  Toy Baker, MD 07/28/12 1019
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
## 2012-07-28 NOTE — ED Notes (Signed)
NAD noted at time of d/c home with family 

## 2012-08-02 ENCOUNTER — Other Ambulatory Visit: Payer: Self-pay | Admitting: *Deleted

## 2012-08-02 MED ORDER — FUROSEMIDE 20 MG PO TABS: 20.0000 mg | ORAL_TABLET | Freq: Every day | ORAL | Status: DC

## 2012-08-02 NOTE — ED Provider Notes (Signed)
Medical screening examination/treatment/procedure(s) were performed by non-physician practitioner and as supervising physician I was immediately available for consultation/collaboration.  Kentarius Partington T Pieper Kasik, MD 08/02/12 2343 

## 2012-08-03 ENCOUNTER — Encounter (INDEPENDENT_AMBULATORY_CARE_PROVIDER_SITE_OTHER): Payer: Medicare Other

## 2012-08-03 ENCOUNTER — Other Ambulatory Visit: Payer: Self-pay | Admitting: Internal Medicine

## 2012-08-03 ENCOUNTER — Ambulatory Visit (INDEPENDENT_AMBULATORY_CARE_PROVIDER_SITE_OTHER): Payer: Medicaid Other | Admitting: Cardiovascular Disease

## 2012-08-03 ENCOUNTER — Encounter: Payer: Self-pay | Admitting: Cardiovascular Disease

## 2012-08-03 VITALS — BP 160/70 | HR 60 | Ht 69.0 in | Wt 114.0 lb

## 2012-08-03 DIAGNOSIS — I739 Peripheral vascular disease, unspecified: Secondary | ICD-10-CM | POA: Diagnosis not present

## 2012-08-03 DIAGNOSIS — I4891 Unspecified atrial fibrillation: Secondary | ICD-10-CM

## 2012-08-03 MED ORDER — DABIGATRAN ETEXILATE MESYLATE 150 MG PO CAPS: 150.0000 mg | ORAL_CAPSULE | Freq: Two times a day (BID) | ORAL | Status: DC

## 2012-08-03 NOTE — Patient Instructions (Addendum)
Quit smoking.  Stop Plavix after 1 week.  Continue Pradaxa.   Your physician recommends that you schedule a follow-up appointment in: 3 months, one week after your ABI and Duplex tests  Your physician has requested that you have an ankle brachial index (ABI) in 3 months. During this test an ultrasound and blood pressure cuff are used to evaluate the arteries that supply the arms and legs with blood. Allow thirty minutes for this exam. There are no restrictions or special instructions.  Your physician has requested that you have an abdominal aorta duplex in 3 months. During this test, an ultrasound is used to evaluate the aorta. Allow 30 minutes for this exam. Do not eat after midnight the day before and avoid carbonated beverages

## 2012-08-05 ENCOUNTER — Encounter: Payer: Self-pay | Admitting: Cardiovascular Disease

## 2012-08-05 NOTE — Assessment & Plan Note (Signed)
The patient has significant improvement in claudication after recent angioplasty and stent placement to the left common iliac artery. His ABI on the left side improved from 0.61 to 0.77. Right ABI is stable. He is to continue Plavix for only another week. I don't want him to stay on this any longer given that he is also on Pradaxa for atrial fibrillation. I discussed with him the importance of smoking cessation. He still has significant right common femoral artery stenosis. However, the patient reports no claudication at this time and thus would continue medical therapy. I will schedule him for an aortoiliac duplex and ABI in 3 months from now per protocol. I will have him followup with me at that time to reevaluate his symptoms.
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
## 2012-08-11 ENCOUNTER — Ambulatory Visit (INDEPENDENT_AMBULATORY_CARE_PROVIDER_SITE_OTHER): Payer: Medicare Other | Admitting: Internal Medicine

## 2012-08-11 VITALS — BP 153/76 | HR 63 | Temp 97.1°F | Ht 69.0 in | Wt 114.7 lb

## 2012-08-11 DIAGNOSIS — D126 Benign neoplasm of colon, unspecified: Secondary | ICD-10-CM

## 2012-08-11 DIAGNOSIS — I10 Essential (primary) hypertension: Secondary | ICD-10-CM | POA: Diagnosis not present

## 2012-08-11 DIAGNOSIS — J984 Other disorders of lung: Secondary | ICD-10-CM | POA: Diagnosis not present

## 2012-08-11 DIAGNOSIS — F17200 Nicotine dependence, unspecified, uncomplicated: Secondary | ICD-10-CM | POA: Diagnosis not present

## 2012-08-11 HISTORY — DX: Benign neoplasm of colon, unspecified: D12.6

## 2012-08-11 NOTE — Assessment & Plan Note (Addendum)
Patient's blood pressure remains elevated above goal. However, he states that he checks his BP 1-2 times daily and "they are good". He reports BP ranging at 100-130/70-80's at all time. And he reports medical compliance. He states that he gets nervous whenever he comes to the office visits. His BMP was   - likely some degree of white coat hypertension.  - will remains on current regimen for now  - will ask him to call the clinic with records of 2 weeks BP, may consider tighter control of his BP if his BPs consistently ranges at 130's/70's since white coat hypertension can also increase the risks of cardiovascular diseases.

## 2012-08-11 NOTE — Patient Instructions (Addendum)
1. Follow up in 6 months 2. Chest CT in Nov. 2013. 3. Colonoscopy in Jan, 2013. 4. SW for smoking cessation

## 2012-08-11 NOTE — Assessment & Plan Note (Addendum)
=  TUBULOVILLOUS ADENOMA  polyps in colonoscopy in 2010 Should have repeat colonoscopy in Nov, 2013.  Patient is asymptomatic and states that he is overwhelmed with multiple doctor's appointments this year and he would prefer for it to be done in January, 14.  - will call [facility] for arrangement of coloscopy in Jan.2014.

## 2012-08-11 NOTE — Assessment & Plan Note (Signed)
-   asymptomatic.  - due for repeat chest CT in November 2013. Ordered entered by Dr. Arvilla Market and patient aware.

## 2012-08-11 NOTE — Assessment & Plan Note (Signed)
Patient continues to smoke 1/pack daily.  - smoking cessation counseled.  - patient states that He is not yet ready to quit.  -We'll continue to encourage smoking cessation and at his office visits. - SW referral
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
## 2012-08-12 ENCOUNTER — Telehealth: Payer: Self-pay | Admitting: *Deleted

## 2012-08-12 ENCOUNTER — Encounter: Payer: Self-pay | Admitting: Internal Medicine

## 2012-08-12 NOTE — Telephone Encounter (Signed)
Pt calls w/ med list : atrovastatin 20mg  one q day  Enalapril 10mg  one twice daily  pantoprazole 40mg  twice daily  metroprolol 50mg  one daily  Furosemide 20mg  one daily  pacerone 200mg  one daily  Amlodipine 5mg  one daily  pradaxin 150mg  twice daily

## 2012-08-16 ENCOUNTER — Telehealth: Payer: Self-pay | Admitting: Licensed Clinical Social Worker

## 2012-08-16 NOTE — Telephone Encounter (Signed)
Daniel [name] was referred to CSW for tobacco use.  Pt did voiced to physician of not quite being ready to stop.  CSW placed call to Daniel [name] to inquire if CSW could provide information in the mail for when pt is ready for smoking cessation.  Daniel [name] states he already has the information and has tried patches in the past but could not afford to keep paying for them.  CSW informed pt Medicaid does cover NRT.  Pt was unaware.  CSW offered to discuss with PCP and have Rx called in to pharmacy.  Pt declined at this time.  CSW informed pt to contact PCP when ready and [facility] can follow up with pharmacy for NRT coverage for pt.  Pt in agreement.  CSW will sign off at this time.

## 2012-08-18 NOTE — Addendum Note (Signed)
Addended by: Hassan Buckler on: 08/18/2012 10:47 AM   Modules accepted: Orders

## 2012-10-24 ENCOUNTER — Encounter (INDEPENDENT_AMBULATORY_CARE_PROVIDER_SITE_OTHER): Payer: Medicaid Other

## 2012-10-24 DIAGNOSIS — I700 Atherosclerosis of aorta: Secondary | ICD-10-CM

## 2012-10-24 DIAGNOSIS — I739 Peripheral vascular disease, unspecified: Secondary | ICD-10-CM

## 2012-10-28 ENCOUNTER — Other Ambulatory Visit: Payer: Self-pay | Admitting: Internal Medicine

## 2012-10-28 ENCOUNTER — Encounter: Payer: Self-pay | Admitting: Internal Medicine

## 2012-11-02 ENCOUNTER — Ambulatory Visit (INDEPENDENT_AMBULATORY_CARE_PROVIDER_SITE_OTHER): Payer: Medicare Other | Admitting: Cardiovascular Disease

## 2012-11-02 ENCOUNTER — Encounter (INDEPENDENT_AMBULATORY_CARE_PROVIDER_SITE_OTHER): Payer: Medicaid Other

## 2012-11-02 ENCOUNTER — Encounter: Payer: Self-pay | Admitting: Cardiovascular Disease

## 2012-11-02 VITALS — BP 156/69 | HR 63 | Ht 69.0 in | Wt 116.4 lb

## 2012-11-02 DIAGNOSIS — I739 Peripheral vascular disease, unspecified: Secondary | ICD-10-CM | POA: Diagnosis not present

## 2012-11-02 DIAGNOSIS — I4891 Unspecified atrial fibrillation: Secondary | ICD-10-CM | POA: Diagnosis not present

## 2012-11-02 NOTE — Patient Instructions (Addendum)
Your physician wants you to follow-up in: 6 months.   You will receive a reminder letter in the mail two months in advance. If you don't receive a letter, please call our office to schedule the follow-up appointment.  Your physician has requested that you have an abdominal aorta duplex in 6 months a few days prior to your next appt. During this test, an ultrasound is used to evaluate the aorta. Allow 30 minutes for this exam. Do not eat after midnight the day before and avoid carbonated beverages
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
## 2012-11-02 NOTE — Assessment & Plan Note (Addendum)
He is doing very well and denies claudication at this time. His ABI is improved (0.62 on right and 0.86 on left). Duplex showed patent stent on left and stable disease on right.  Continue medical therapy. He should work on smoking cessation.  Follow up Duplex in 6 months.

## 2012-11-02 NOTE — Addendum Note (Signed)
Addended by: Worthy Rancher D on: 11/02/2012 03:21 PM   Modules accepted: Orders

## 2013-01-06 ENCOUNTER — Other Ambulatory Visit: Payer: Self-pay | Admitting: *Deleted

## 2013-01-06 MED ORDER — ENALAPRIL MALEATE 10 MG PO TABS: 10.0000 mg | ORAL_TABLET | Freq: Two times a day (BID) | ORAL | Status: DC

## 2013-01-06 NOTE — Telephone Encounter (Signed)
Has March appt with PCP

## 2013-01-11 ENCOUNTER — Other Ambulatory Visit: Payer: Self-pay | Admitting: *Deleted

## 2013-01-11 MED ORDER — METOPROLOL SUCCINATE ER 50 MG PO TB24: ORAL_TABLET | ORAL | Status: DC

## 2013-01-16 ENCOUNTER — Other Ambulatory Visit: Payer: Self-pay

## 2013-03-03 ENCOUNTER — Ambulatory Visit (INDEPENDENT_AMBULATORY_CARE_PROVIDER_SITE_OTHER): Payer: Medicare Other | Admitting: Internal Medicine

## 2013-03-03 ENCOUNTER — Encounter: Payer: Self-pay | Admitting: Internal Medicine

## 2013-03-03 VITALS — BP 137/67 | HR 53 | Temp 97.0°F | Wt 115.0 lb

## 2013-03-03 DIAGNOSIS — R911 Solitary pulmonary nodule: Secondary | ICD-10-CM

## 2013-03-03 DIAGNOSIS — R918 Other nonspecific abnormal finding of lung field: Secondary | ICD-10-CM

## 2013-03-03 DIAGNOSIS — E538 Deficiency of other specified B group vitamins: Secondary | ICD-10-CM | POA: Diagnosis not present

## 2013-03-03 DIAGNOSIS — F17200 Nicotine dependence, unspecified, uncomplicated: Secondary | ICD-10-CM

## 2013-03-03 DIAGNOSIS — I428 Other cardiomyopathies: Secondary | ICD-10-CM | POA: Diagnosis not present

## 2013-03-03 DIAGNOSIS — N400 Enlarged prostate without lower urinary tract symptoms: Secondary | ICD-10-CM | POA: Diagnosis not present

## 2013-03-03 DIAGNOSIS — I519 Heart disease, unspecified: Secondary | ICD-10-CM | POA: Diagnosis not present

## 2013-03-03 DIAGNOSIS — K259 Gastric ulcer, unspecified as acute or chronic, without hemorrhage or perforation: Secondary | ICD-10-CM | POA: Diagnosis not present

## 2013-03-03 DIAGNOSIS — D126 Benign neoplasm of colon, unspecified: Secondary | ICD-10-CM

## 2013-03-03 DIAGNOSIS — E785 Hyperlipidemia, unspecified: Secondary | ICD-10-CM | POA: Diagnosis not present

## 2013-03-03 DIAGNOSIS — Z0000 Encounter for general adult medical examination without abnormal findings: Secondary | ICD-10-CM | POA: Diagnosis not present

## 2013-03-03 DIAGNOSIS — Z720 Tobacco use: Secondary | ICD-10-CM

## 2013-03-03 DIAGNOSIS — I10 Essential (primary) hypertension: Secondary | ICD-10-CM | POA: Diagnosis not present

## 2013-03-03 DIAGNOSIS — I739 Peripheral vascular disease, unspecified: Secondary | ICD-10-CM

## 2013-03-03 DIAGNOSIS — I4891 Unspecified atrial fibrillation: Secondary | ICD-10-CM

## 2013-03-03 LAB — LIPID PANEL
Cholesterol: 133 mg/dL (ref 0–200)
HDL: 50 mg/dL (ref >39)
LDL Cholesterol: 73 mg/dL (ref 0–99)
Total CHOL/HDL Ratio: 2.7 Ratio
Triglycerides: 52 mg/dL (ref <150)
VLDL: 10 mg/dL (ref 0–40)

## 2013-03-03 LAB — BASIC METABOLIC PANEL WITH GFR
BUN: 7 mg/dL (ref 6–23)
CO2: 26 mEq/L (ref 19–32)
Calcium: 10.1 mg/dL (ref 8.4–10.5)
Chloride: 107 mEq/L (ref 96–112)
Creat: 1.27 mg/dL (ref 0.50–1.35)
GFR, Est African American: 65 mL/min
GFR, Est Non African American: 56 mL/min — ABNORMAL LOW
Glucose, Bld: 88 mg/dL (ref 70–99)
Potassium: 4.9 mEq/L (ref 3.5–5.3)
Sodium: 140 mEq/L (ref 135–145)

## 2013-03-03 LAB — VITAMIN B12: Vitamin B-12: 366 pg/mL (ref 211–911)

## 2013-03-03 NOTE — Assessment & Plan Note (Signed)
We are continuing to manage risk factors including his hypertension and hyperlipidemia. His blood pressure is at target and we will check a lipid panel at the next visit. He is to continue his antihypertensive regimen and the atorvastatin. Smoking cessation was stressed but he was not mentally ready to quit at this time. We will continue to encourage smoking cessation at each visit.

## 2013-03-03 NOTE — Assessment & Plan Note (Signed)
He was without any complaints suggestive of symptomatic gastritis or peptic ulcer disease. We will continue the PPI therapy.

## 2013-03-03 NOTE — Assessment & Plan Note (Signed)
He carries a diagnosis of vitamin B12 deficiency although the last B12 level several years ago was nearly 500. We will repeat the vitamin B12 level today to see if in fact he is deficient.

## 2013-03-03 NOTE — Assessment & Plan Note (Signed)
We discussed the risks associated with smoking and I encouraged him to quit. He states he's not quite mentally ready at this time. Therefore he is not interested in any pharmacologic intervention until he is mentally prepared to quit smoking. We will bring this issue up at each visit given his underlying vascular disease.

## 2013-03-03 NOTE — Assessment & Plan Note (Signed)
He currently has nocturia 3-4 times per night. This is not particularly distressing for him. This is fortunate as he did not tolerate the tamsulosin in the past secondary to dizziness. If the symptoms should become more distressing we can consider another trial of tamsulosin on a much less intensive blood pressure and diuretic regimen that he was on at the time or consider the initiation of finasteride.

## 2013-03-03 NOTE — Assessment & Plan Note (Signed)
His most recent left ventricular ejection fraction was 55% while on the beta blocker and ACE inhibitor. I doubt he requires continued Lasix therapy. If he is continuing to do well at the next visit we will consider discontinuing the Lasix. We may be able to slightly increase the dose of the ACE inhibitor at that time.

## 2013-03-03 NOTE — Patient Instructions (Addendum)
It was nice to meet you.  I look forward to taking care of you for years to come.  1) Keep taking all of your medications as prescribed.  2) We checked your kidney function, cholesterol and vitamin B12 level today.  3) I ordered a CT scan of the chest to follow-up on your lung nodule.  4) I will see you back in 6 months to follow-up your blood pressure.

## 2013-03-03 NOTE — Assessment & Plan Note (Signed)
We discussed the flu vaccination, Pneumovax, tetanus booster, and Zostavax but Daniel [name] is not interested in any vaccinations at this time or in the future. We will reassess and off for the vaccinations next year in case he changes his mind.

## 2013-03-03 NOTE — Assessment & Plan Note (Signed)
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 He is due for surveillance colonoscopy in November 2015.

## 2013-03-03 NOTE — Progress Notes (Signed)
  Subjective:    Patient ID: Daniel [name], male    DOB: 10-03-1941, 72 y.o.   MRN: [id number]  HPI  Please see the A&P for the status of the pt's chronic medical problems.  Review of Systems  Constitutional: Positive for appetite change. Negative for activity change and fatigue.  Respiratory: Negative for chest tightness, shortness of breath and wheezing.   Cardiovascular: Negative for chest pain, palpitations and leg swelling.  Gastrointestinal: Negative for abdominal pain and abdominal distention.  Genitourinary: Positive for frequency.  Musculoskeletal: Positive for back pain and arthralgias. Negative for myalgias, joint swelling and gait problem.  Neurological: Negative for dizziness and light-headedness.      Objective:   Physical Exam  Nursing note and vitals reviewed. Constitutional: He is oriented to person, place, and time. He appears well-developed and well-nourished. No distress.  HENT:  Head: Normocephalic and atraumatic.  Eyes: Conjunctivae are normal. No scleral icterus.  Neck: Normal range of motion.  Cardiovascular: Normal rate and regular rhythm.  Exam reveals no gallop and no friction rub.   Murmur heard. Pulmonary/Chest: Effort normal and breath sounds normal. No stridor. No respiratory distress. He has no wheezes. He has no rales.  Abdominal: Soft. Bowel sounds are normal. He exhibits no distension. There is no tenderness. There is no rebound and no guarding.  Musculoskeletal: Normal range of motion. He exhibits no edema and no tenderness.  Neurological: He is alert and oriented to person, place, and time.  Skin: Skin is warm and dry. He is not diaphoretic.  Psychiatric: He has a normal mood and affect. His behavior is normal. Judgment and thought content normal.      Assessment & Plan:   Please see problem oriented charting.

## 2013-03-03 NOTE — Assessment & Plan Note (Signed)
His blood pressure is at target on the amlodipine 5 mg per mouth daily, enalapril 10 mg by mouth twice a day, metoprolol XL 50 mg by mouth daily and furosemide 20 mg by mouth daily. His pulse limits titration of the metoprolol XL any higher. There is some room to increase the enalapril dose and the amlodipine dose should it become necessary. I am not convinced he requires the Lasix any longer given the most recent left ventricular ejection fraction was 55%. If he is continuing to do well at the next visit we will consider discontinuing the Lasix.

## 2013-03-03 NOTE — Assessment & Plan Note (Signed)
On my exam today he appear to be in a regularly regular rhythm. Therefore, the amiodarone seems to be effective in maintaining him in normal sinus rhythm. His rate is also very well controlled on the metoprolol should he slipped back into atrial fibrillation. He is tolerating the Pradaxa as well. At the next visit we will obtain a TSH since he is on the amiodarone and discuss the need for a repeat pulmonary function test to assure no decrement in his lung volumes or diffusing capacity while on the amiodarone.

## 2013-03-03 NOTE — Assessment & Plan Note (Signed)
He is due for a serial CT scan of the lung to assure the left lower lobe nodule has been stable for a period of 2 years. If so, no further workup of this nodule will be necessary. He does have a groundglass opacity in the right upper lobe that was initially discovered in September 2012. These groundglass lesions could represent a slow growing adenocarcinoma and require CT followup for a total of 3 years. A CT scan was ordered at this visit to followup on these lung nodules.

## 2013-03-06 ENCOUNTER — Telehealth: Payer: Self-pay | Admitting: *Deleted

## 2013-03-06 DIAGNOSIS — I4891 Unspecified atrial fibrillation: Secondary | ICD-10-CM

## 2013-03-06 NOTE — Telephone Encounter (Signed)
Pt called stating he received a call from [facility] today asking him to call back. Pt # [phone]

## 2013-03-06 NOTE — Telephone Encounter (Signed)
I think Dr. Josem Kaufmann may have called this patient.

## 2013-03-06 NOTE — Telephone Encounter (Signed)
Mr. Mcquiston notes a dry eye.  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  Will refer to Opthalmology.  I informed him of his lab results during the call as well.

## 2013-03-06 NOTE — Progress Notes (Signed)
BMP unremarkable, eGFR 65 Vitamin B12 366 Total cholesterol 133 Triglycerides 52 HDL 50 LDL 73  Vitamin B12 level remains at low end of normal range but no intervention required at this time.  I tried calling Daniel [name] to inform him of the results but received an unidentified voice mail message so no message was left.

## 2013-03-20 ENCOUNTER — Ambulatory Visit (HOSPITAL_COMMUNITY)
Admission: RE | Admit: 2013-03-20 | Discharge: 2013-03-20 | Disposition: A | Discharge status: Home / Self Care | Payer: Medicare Other | Source: Ambulatory Visit | Attending: Internal Medicine | Admitting: Internal Medicine

## 2013-03-20 ENCOUNTER — Encounter (HOSPITAL_COMMUNITY): Payer: Self-pay

## 2013-03-20 DIAGNOSIS — R599 Enlarged lymph nodes, unspecified: Secondary | ICD-10-CM | POA: Diagnosis not present

## 2013-03-20 DIAGNOSIS — J449 Chronic obstructive pulmonary disease, unspecified: Secondary | ICD-10-CM | POA: Diagnosis not present

## 2013-03-20 DIAGNOSIS — I728 Aneurysm of other specified arteries: Secondary | ICD-10-CM | POA: Insufficient documentation

## 2013-03-20 DIAGNOSIS — I2510 Atherosclerotic heart disease of native coronary artery without angina pectoris: Secondary | ICD-10-CM | POA: Insufficient documentation

## 2013-03-20 DIAGNOSIS — R911 Solitary pulmonary nodule: Secondary | ICD-10-CM | POA: Insufficient documentation

## 2013-03-20 DIAGNOSIS — M479 Spondylosis, unspecified: Secondary | ICD-10-CM | POA: Insufficient documentation

## 2013-03-20 DIAGNOSIS — J4489 Other specified chronic obstructive pulmonary disease: Secondary | ICD-10-CM | POA: Insufficient documentation

## 2013-04-20 DIAGNOSIS — H26499 Other secondary cataract, unspecified eye: Secondary | ICD-10-CM | POA: Diagnosis not present

## 2013-04-24 ENCOUNTER — Other Ambulatory Visit: Payer: Self-pay | Admitting: Internal Medicine

## 2013-05-01 ENCOUNTER — Encounter: Payer: Self-pay | Admitting: Internal Medicine

## 2013-05-31 ENCOUNTER — Other Ambulatory Visit: Payer: Self-pay | Admitting: Cardiology

## 2013-05-31 ENCOUNTER — Other Ambulatory Visit: Payer: Self-pay | Admitting: Internal Medicine

## 2013-05-31 DIAGNOSIS — E785 Hyperlipidemia, unspecified: Secondary | ICD-10-CM

## 2013-06-07 ENCOUNTER — Other Ambulatory Visit: Payer: Self-pay | Admitting: Internal Medicine

## 2013-06-07 DIAGNOSIS — K259 Gastric ulcer, unspecified as acute or chronic, without hemorrhage or perforation: Secondary | ICD-10-CM

## 2013-07-03 ENCOUNTER — Other Ambulatory Visit: Payer: Self-pay | Admitting: Internal Medicine

## 2013-07-03 DIAGNOSIS — I4891 Unspecified atrial fibrillation: Secondary | ICD-10-CM

## 2013-07-06 ENCOUNTER — Encounter (HOSPITAL_COMMUNITY): Payer: Self-pay | Admitting: Emergency Medicine

## 2013-07-06 ENCOUNTER — Emergency Department (HOSPITAL_COMMUNITY): Payer: Medicare Other

## 2013-07-06 ENCOUNTER — Emergency Department (HOSPITAL_COMMUNITY)
Admission: EM | Admit: 2013-07-06 | Discharge: 2013-07-06 | Disposition: A | Discharge status: Home / Self Care | Payer: Medicare Other | Attending: Emergency Medicine | Admitting: Emergency Medicine

## 2013-07-06 DIAGNOSIS — Z8739 Personal history of other diseases of the musculoskeletal system and connective tissue: Secondary | ICD-10-CM | POA: Insufficient documentation

## 2013-07-06 DIAGNOSIS — R5381 Other malaise: Secondary | ICD-10-CM | POA: Insufficient documentation

## 2013-07-06 DIAGNOSIS — I499 Cardiac arrhythmia, unspecified: Secondary | ICD-10-CM | POA: Diagnosis not present

## 2013-07-06 DIAGNOSIS — Z79899 Other long term (current) drug therapy: Secondary | ICD-10-CM | POA: Diagnosis not present

## 2013-07-06 DIAGNOSIS — Z8679 Personal history of other diseases of the circulatory system: Secondary | ICD-10-CM | POA: Diagnosis not present

## 2013-07-06 DIAGNOSIS — R079 Chest pain, unspecified: Secondary | ICD-10-CM | POA: Diagnosis not present

## 2013-07-06 DIAGNOSIS — Z8711 Personal history of peptic ulcer disease: Secondary | ICD-10-CM | POA: Insufficient documentation

## 2013-07-06 DIAGNOSIS — Z8709 Personal history of other diseases of the respiratory system: Secondary | ICD-10-CM | POA: Insufficient documentation

## 2013-07-06 DIAGNOSIS — Z8669 Personal history of other diseases of the nervous system and sense organs: Secondary | ICD-10-CM | POA: Diagnosis not present

## 2013-07-06 DIAGNOSIS — Z87828 Personal history of other (healed) physical injury and trauma: Secondary | ICD-10-CM | POA: Diagnosis not present

## 2013-07-06 DIAGNOSIS — F17200 Nicotine dependence, unspecified, uncomplicated: Secondary | ICD-10-CM | POA: Insufficient documentation

## 2013-07-06 DIAGNOSIS — Z862 Personal history of diseases of the blood and blood-forming organs and certain disorders involving the immune mechanism: Secondary | ICD-10-CM | POA: Diagnosis not present

## 2013-07-06 DIAGNOSIS — E785 Hyperlipidemia, unspecified: Secondary | ICD-10-CM | POA: Diagnosis not present

## 2013-07-06 DIAGNOSIS — R911 Solitary pulmonary nodule: Secondary | ICD-10-CM | POA: Diagnosis not present

## 2013-07-06 DIAGNOSIS — I10 Essential (primary) hypertension: Secondary | ICD-10-CM | POA: Diagnosis not present

## 2013-07-06 DIAGNOSIS — R002 Palpitations: Secondary | ICD-10-CM | POA: Insufficient documentation

## 2013-07-06 DIAGNOSIS — R5383 Other fatigue: Secondary | ICD-10-CM | POA: Insufficient documentation

## 2013-07-06 LAB — CBC WITH DIFFERENTIAL/PLATELET
Basophils Absolute: 0 10*3/uL (ref 0.0–0.1)
Basophils Relative: 0 % (ref 0–1)
Eosinophils Absolute: 0.4 10*3/uL (ref 0.0–0.7)
Eosinophils Relative: 4 % (ref 0–5)
HCT: 40.8 % (ref 39.0–52.0)
Hemoglobin: 14.2 g/dL (ref 13.0–17.0)
Lymphocytes Relative: 15 % (ref 12–46)
Lymphs Abs: 1.5 10*3/uL (ref 0.7–4.0)
MCH: 34.3 pg — ABNORMAL HIGH (ref 26.0–34.0)
MCHC: 34.8 g/dL (ref 30.0–36.0)
MCV: 98.6 fL (ref 78.0–100.0)
Monocytes Absolute: 0.5 10*3/uL (ref 0.1–1.0)
Monocytes Relative: 5 % (ref 3–12)
Neutro Abs: 7.5 10*3/uL (ref 1.7–7.7)
Neutrophils Relative %: 76 % (ref 43–77)
Platelets: 215 10*3/uL (ref 150–400)
RBC: 4.14 MIL/uL — ABNORMAL LOW (ref 4.22–5.81)
RDW: 13.6 % (ref 11.5–15.5)
WBC: 10 10*3/uL (ref 4.0–10.5)

## 2013-07-06 LAB — COMPREHENSIVE METABOLIC PANEL
ALT: 16 U/L (ref 0–53)
AST: 29 U/L (ref 0–37)
Albumin: 4.1 g/dL (ref 3.5–5.2)
Alkaline Phosphatase: 94 U/L (ref 39–117)
BUN: 8 mg/dL (ref 6–23)
CO2: 24 mEq/L (ref 19–32)
Calcium: 10.9 mg/dL — ABNORMAL HIGH (ref 8.4–10.5)
Chloride: 104 mEq/L (ref 96–112)
Creatinine, Ser: 1.35 mg/dL (ref 0.50–1.35)
GFR calc Af Amer: 59 mL/min — ABNORMAL LOW (ref >90)
GFR calc non Af Amer: 51 mL/min — ABNORMAL LOW (ref >90)
Glucose, Bld: 102 mg/dL — ABNORMAL HIGH (ref 70–99)
Potassium: 5.7 mEq/L — ABNORMAL HIGH (ref 3.5–5.1)
Sodium: 141 mEq/L (ref 135–145)
Total Bilirubin: 0.4 mg/dL (ref 0.3–1.2)
Total Protein: 7.5 g/dL (ref 6.0–8.3)

## 2013-07-06 LAB — POTASSIUM: Potassium: 4.7 mEq/L (ref 3.5–5.1)

## 2013-07-06 LAB — TROPONIN I: Troponin I: <0.3 ng/mL (ref <0.30)

## 2013-07-06 NOTE — ED Notes (Signed)
Pt presents with afib with PVCs per EMS; Pt reports "stinging in heart" after going on walk; is pain free at this time. Reports daily bilateral foot swelling. PT ambulates with a steady gait to restroom.

## 2013-07-06 NOTE — ED Notes (Signed)
Lab called to recollect potassium.
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
## 2013-07-06 NOTE — ED Notes (Signed)
X-ray at bedside

## 2013-08-08 ENCOUNTER — Ambulatory Visit (INDEPENDENT_AMBULATORY_CARE_PROVIDER_SITE_OTHER): Payer: Medicare Other | Admitting: Internal Medicine

## 2013-08-08 ENCOUNTER — Ambulatory Visit (HOSPITAL_COMMUNITY)
Admission: RE | Admit: 2013-08-08 | Discharge: 2013-08-08 | Disposition: A | Discharge status: Home / Self Care | Payer: Medicare Other | Source: Ambulatory Visit | Attending: Internal Medicine | Admitting: Internal Medicine

## 2013-08-08 ENCOUNTER — Encounter: Payer: Self-pay | Admitting: Internal Medicine

## 2013-08-08 VITALS — BP 139/67 | HR 50 | Temp 97.0°F | Ht 69.0 in | Wt 117.0 lb

## 2013-08-08 DIAGNOSIS — M19019 Primary osteoarthritis, unspecified shoulder: Secondary | ICD-10-CM | POA: Diagnosis not present

## 2013-08-08 DIAGNOSIS — G8911 Acute pain due to trauma: Secondary | ICD-10-CM

## 2013-08-08 DIAGNOSIS — M25519 Pain in unspecified shoulder: Secondary | ICD-10-CM | POA: Diagnosis not present

## 2013-08-08 MED ORDER — HYDROCODONE-ACETAMINOPHEN 10-325 MG PO TABS: 1.0000 | ORAL_TABLET | Freq: Four times a day (QID) | ORAL | Status: DC | PRN

## 2013-08-08 NOTE — Patient Instructions (Addendum)
It was a pleasure to meet you today! - Please make an appointment to return to our clinic the day following your MRI. - We will prescribe you hydrocodone 1 tablet every 6 hours as needed for pain control. Take these as prescribed. - Apply ice to your shoulder 30 minutes on and 30 minutes off as needed for symptoms. - Limit painful movement of your arm to reduced symptoms.

## 2013-08-08 NOTE — Progress Notes (Signed)
Mr. Ashmead presents today with severe right shoulder pain X 1 month that began 4 days after following off of a stool while trying to kill a spider.  He fell between the wall and the dresser backwards and hit his head and right shoulder posteriorly.  The pain occurred 4 days later and is described as sharp and radiating down the arm posteriorly.  He can not raise his arm above parallel to the floor or reach into his back pocket without pain.  Tylenol has been ineffective and pain is worse when bending over and lifting an object off of the floor.  The pain was not getting any better so he decided to present to clinic for further evaluation.  He denies any weakness or sensory changes.  He is w/o other complaints at this time.  Physical Examination  BP 139/67 P 50 Gen: WDWN man sitting comfortably in a chair in NAD Right shoulder: ? old bruise posteriorly.  No visual or palpable deformities.  Pain with lifting the right arm above parallel to the floor when abducted anteriorly and laterally.  Pain with external rotation.  No weakness or sensory changes to upper arm, forearm, or hand  Right shoulder X-ray: without evidence of fracture or dislocation  Assessment  Mr. Waibel is a 72 yo man with a post-traumatic right shoulder injury.  Plain film X-ray is w/o evidence of fracture or dislocation.  Exam is concerning for a rotator cuff tear.  Plan  1) Right shoulder pain:  We will order an MRI of the right shoulder to look for evidence of a rotator cuff tear.  Mr. Engen would like the results of this study prior to deciding upon any further intervention should it be necessary/recommended.  In the interim, we will prescribe Narco 1 tablet PO Q6H PRN pain (dispense #30).  He will be followed up in the [facility] after the MRI to review the results.

## 2013-08-11 ENCOUNTER — Ambulatory Visit (HOSPITAL_COMMUNITY): Payer: Medicare Other

## 2013-08-17 ENCOUNTER — Ambulatory Visit (HOSPITAL_COMMUNITY)
Admission: RE | Admit: 2013-08-17 | Discharge: 2013-08-17 | Disposition: A | Discharge status: Home / Self Care | Payer: Medicare Other | Source: Ambulatory Visit | Attending: Internal Medicine | Admitting: Internal Medicine

## 2013-08-17 DIAGNOSIS — S46819A Strain of other muscles, fascia and tendons at shoulder and upper arm level, unspecified arm, initial encounter: Secondary | ICD-10-CM | POA: Insufficient documentation

## 2013-08-17 DIAGNOSIS — X58XXXA Exposure to other specified factors, initial encounter: Secondary | ICD-10-CM | POA: Insufficient documentation

## 2013-08-17 DIAGNOSIS — M25519 Pain in unspecified shoulder: Secondary | ICD-10-CM | POA: Diagnosis not present

## 2013-08-17 DIAGNOSIS — G8911 Acute pain due to trauma: Secondary | ICD-10-CM

## 2013-08-29 ENCOUNTER — Ambulatory Visit (INDEPENDENT_AMBULATORY_CARE_PROVIDER_SITE_OTHER): Payer: Medicare Other | Admitting: Cardiology

## 2013-08-29 ENCOUNTER — Encounter: Payer: Self-pay | Admitting: Cardiology

## 2013-08-29 VITALS — BP 124/62 | HR 57 | Ht 69.0 in | Wt 109.0 lb

## 2013-08-29 DIAGNOSIS — I4891 Unspecified atrial fibrillation: Secondary | ICD-10-CM

## 2013-08-29 DIAGNOSIS — F17200 Nicotine dependence, unspecified, uncomplicated: Secondary | ICD-10-CM

## 2013-08-29 DIAGNOSIS — E785 Hyperlipidemia, unspecified: Secondary | ICD-10-CM

## 2013-08-29 DIAGNOSIS — I428 Other cardiomyopathies: Secondary | ICD-10-CM

## 2013-08-29 DIAGNOSIS — I739 Peripheral vascular disease, unspecified: Secondary | ICD-10-CM

## 2013-08-29 DIAGNOSIS — Z720 Tobacco use: Secondary | ICD-10-CM

## 2013-08-29 NOTE — Patient Instructions (Addendum)
Your physician recommends that you return for lab work today--Liver profile/BMET/TSH.   Your physician wants you to follow-up in: 6 months with Dr Shirlee Latch. (March 2015). You will receive a reminder letter in the mail two months in advance. If you don't receive a letter, please call our office to schedule the follow-up appointment.   Your physician recommends that you return for a FASTING lipid profile: in 6 months when you see Dr Shirlee Latch.

## 2013-08-30 LAB — BASIC METABOLIC PANEL
BUN: 14 mg/dL (ref 6–23)
CO2: 28 mEq/L (ref 19–32)
Calcium: 10.3 mg/dL (ref 8.4–10.5)
Chloride: 105 mEq/L (ref 96–112)
Creatinine, Ser: 1.4 mg/dL (ref 0.4–1.5)
GFR: 63.52 mL/min (ref >60.00)
Glucose, Bld: 98 mg/dL (ref 70–99)
Potassium: 5.2 mEq/L — ABNORMAL HIGH (ref 3.5–5.1)
Sodium: 138 mEq/L (ref 135–145)

## 2013-08-30 LAB — HEPATIC FUNCTION PANEL
ALT: 18 U/L (ref 0–53)
AST: 23 U/L (ref 0–37)
Albumin: 3.9 g/dL (ref 3.5–5.2)
Alkaline Phosphatase: 79 U/L (ref 39–117)
Bilirubin, Direct: 0.1 mg/dL (ref 0.0–0.3)
Total Bilirubin: 0.5 mg/dL (ref 0.3–1.2)
Total Protein: 7.3 g/dL (ref 6.0–8.3)

## 2013-08-30 LAB — TSH: TSH: 0.99 u[IU]/mL (ref 0.35–5.50)
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
## 2013-09-01 ENCOUNTER — Other Ambulatory Visit: Payer: Self-pay | Admitting: *Deleted

## 2013-09-01 DIAGNOSIS — E875 Hyperkalemia: Secondary | ICD-10-CM

## 2013-09-04 ENCOUNTER — Other Ambulatory Visit: Payer: Self-pay | Admitting: Internal Medicine

## 2013-09-04 DIAGNOSIS — I4891 Unspecified atrial fibrillation: Secondary | ICD-10-CM

## 2013-09-07 ENCOUNTER — Encounter: Payer: Self-pay | Admitting: Internal Medicine

## 2013-09-07 ENCOUNTER — Ambulatory Visit (INDEPENDENT_AMBULATORY_CARE_PROVIDER_SITE_OTHER): Payer: Medicare Other | Admitting: Internal Medicine

## 2013-09-07 VITALS — BP 126/64 | HR 54 | Temp 97.9°F | Wt 110.5 lb

## 2013-09-07 DIAGNOSIS — M25511 Pain in right shoulder: Secondary | ICD-10-CM

## 2013-09-07 DIAGNOSIS — Z0000 Encounter for general adult medical examination without abnormal findings: Secondary | ICD-10-CM | POA: Diagnosis not present

## 2013-09-07 DIAGNOSIS — I739 Peripheral vascular disease, unspecified: Secondary | ICD-10-CM

## 2013-09-07 DIAGNOSIS — M479 Spondylosis, unspecified: Secondary | ICD-10-CM

## 2013-09-07 DIAGNOSIS — D126 Benign neoplasm of colon, unspecified: Secondary | ICD-10-CM | POA: Diagnosis not present

## 2013-09-07 DIAGNOSIS — N400 Enlarged prostate without lower urinary tract symptoms: Secondary | ICD-10-CM

## 2013-09-07 DIAGNOSIS — I4891 Unspecified atrial fibrillation: Secondary | ICD-10-CM | POA: Diagnosis not present

## 2013-09-07 DIAGNOSIS — F17200 Nicotine dependence, unspecified, uncomplicated: Secondary | ICD-10-CM | POA: Diagnosis not present

## 2013-09-07 DIAGNOSIS — K259 Gastric ulcer, unspecified as acute or chronic, without hemorrhage or perforation: Secondary | ICD-10-CM | POA: Diagnosis not present

## 2013-09-07 DIAGNOSIS — Z720 Tobacco use: Secondary | ICD-10-CM

## 2013-09-07 DIAGNOSIS — M47815 Spondylosis without myelopathy or radiculopathy, thoracolumbar region: Secondary | ICD-10-CM

## 2013-09-07 DIAGNOSIS — M25519 Pain in unspecified shoulder: Secondary | ICD-10-CM

## 2013-09-07 DIAGNOSIS — I10 Essential (primary) hypertension: Secondary | ICD-10-CM

## 2013-09-07 NOTE — Patient Instructions (Signed)
It was good to see you again.  1) You are doing great.  Keep taking all of your medications as you are.  2) I will schedule you for a breathing test at [facility].  I will see you back in 6 months, sooner if necessary

## 2013-09-08 ENCOUNTER — Encounter: Payer: Medicare Other | Admitting: Internal Medicine

## 2013-09-11 ENCOUNTER — Other Ambulatory Visit (INDEPENDENT_AMBULATORY_CARE_PROVIDER_SITE_OTHER): Payer: Medicare Other

## 2013-09-11 DIAGNOSIS — E875 Hyperkalemia: Secondary | ICD-10-CM

## 2013-09-11 LAB — BASIC METABOLIC PANEL
BUN: 9 mg/dL (ref 6–23)
CO2: 24 mEq/L (ref 19–32)
Calcium: 9.8 mg/dL (ref 8.4–10.5)
Chloride: 98 mEq/L (ref 96–112)
Creatinine, Ser: 1.3 mg/dL (ref 0.4–1.5)
GFR: 68.54 mL/min (ref >60.00)
Glucose, Bld: 86 mg/dL (ref 70–99)
Potassium: 3.5 mEq/L (ref 3.5–5.1)
Sodium: 132 mEq/L — ABNORMAL LOW (ref 135–145)

## 2013-09-12 ENCOUNTER — Encounter: Payer: Self-pay | Admitting: Internal Medicine

## 2013-09-12 DIAGNOSIS — M25511 Pain in right shoulder: Secondary | ICD-10-CM | POA: Insufficient documentation

## 2013-09-12 NOTE — Assessment & Plan Note (Signed)
We again discussed the benefits of smoking cessation given his underlying peripheral vascular disease.  He remains uninterested in smoking cessation (pre-contemplative) at this time.  Will readdress at the follow-up visit.

## 2013-09-12 NOTE — Progress Notes (Signed)
  Subjective:    Patient ID: Daniel [name], male    DOB: 1941-09-24, 72 y.o.   MRN: [id number]  HPI  Please see the A&P for the status of the pt's chronic medical problems.  Review of Systems  Constitutional: Negative for fever, activity change, appetite change and unexpected weight change.  Respiratory: Negative for cough, chest tightness, shortness of breath and wheezing.   Cardiovascular: Negative for chest pain, palpitations and leg swelling.  Gastrointestinal: Negative for nausea, vomiting, abdominal pain, diarrhea and constipation.  Genitourinary: Positive for urgency, frequency and difficulty urinating.  Musculoskeletal: Positive for myalgias, back pain and arthralgias. Negative for joint swelling.  Allergic/Immunologic: Negative for environmental allergies and food allergies.  Neurological: Negative for dizziness, syncope and weakness.  Psychiatric/Behavioral: Negative for dysphoric mood and decreased concentration. The patient is not nervous/anxious.       Objective:   Physical Exam  Nursing note and vitals reviewed. Constitutional: He is oriented to person, place, and time. He appears well-developed and well-nourished. No distress.  HENT:  Head: Normocephalic and atraumatic.  Neck: Neck supple.  Cardiovascular: Normal rate and regular rhythm.  Exam reveals no gallop and no friction rub.   Murmur heard. Pulmonary/Chest: Effort normal and breath sounds normal. No respiratory distress. He has no wheezes. He has no rales.  Abdominal: Soft. Bowel sounds are normal. He exhibits no distension. There is no tenderness. There is no rebound and no guarding.  Musculoskeletal: Normal range of motion. He exhibits no edema and no tenderness.  Neurological: He is alert and oriented to person, place, and time. He has normal reflexes. He exhibits normal muscle tone.  Skin: Skin is warm and dry. He is not diaphoretic.  Psychiatric: He has a normal mood and affect. His behavior is normal.  Judgment and thought content normal.      Assessment & Plan:   Please see problem oriented charting.

## 2013-09-12 NOTE — Assessment & Plan Note (Signed)
No epigastric symptoms on the PPI therapy, we will therefore continue.

## 2013-09-12 NOTE — Assessment & Plan Note (Signed)
Blood pressure is at target at 126/64.  He is tolerating the metoprolol XL, enalapril, and amlodipine.  We will therefore continue these medications at the current doses.

## 2013-09-12 NOTE — Assessment & Plan Note (Signed)
Although he remains asymptomatic at this point and his blood pressure and LDL are at target, he remains in the pre-contemplative stage with regards to tobacco cessation.  We will continue to stress the importance of cessation at each visit.  In the meantime, we will continue to encourage compliance with his antihypertensives and statin therapy and to remain active.

## 2013-09-12 NOTE — Assessment & Plan Note (Signed)
Will refer for colonoscopy in late 2015 when he is due for surveillance.

## 2013-09-12 NOTE — Assessment & Plan Note (Signed)
By exam he seems to be in NSR at a controlled rate.  Thus, the amiodarone appears to be maintaining NSR.  He is also tolerating the metoprolol should he flip back into a-fib and the pradaxa for anticoagulation.  We will order pulmonary function tests to assure his TLC and DLCO are not declining while on the amiodarone.  Otherwise, will continue the amiodarone at 200 mg PO daily, metoprolol XL 75 mg PO daily, and pradaxa 150 mg PO twice a day.

## 2013-09-12 NOTE — Assessment & Plan Note (Signed)
His nocturia persists unchanged but is not particularly bothersome for him at this time.  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  At the next visit I will propose that he take it on an as needed basis to prove to him it may no longer be necessary.  If stopping the lasix is not helpful for his symptoms and he desires therapy we can consider the addition of finasteride given his intolerance of the tamsulosin in the past.
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
## 2013-09-12 NOTE — Assessment & Plan Note (Signed)
I offered the flu, Tdap, pneumovax, and zostavax immunizations again today and he remains uninterested in any vaccinations.  Will readdress at the next visit.

## 2013-09-12 NOTE — Assessment & Plan Note (Signed)
Right shoulder pain after the fall has nearly resolved.  MRI was notable only for a mild strain w/o tear.  He continues to exercise the right shoulder with improving results.  This was encouraged until pain completely resolves.

## 2013-09-21 ENCOUNTER — Encounter: Payer: Self-pay | Admitting: Internal Medicine

## 2013-09-21 DIAGNOSIS — H18009 Unspecified corneal deposit, unspecified eye: Secondary | ICD-10-CM

## 2013-09-21 DIAGNOSIS — T462X5A Adverse effect of other antidysrhythmic drugs, initial encounter: Secondary | ICD-10-CM

## 2013-09-21 HISTORY — DX: Unspecified corneal deposit, unspecified eye: H18.009

## 2013-10-03 ENCOUNTER — Other Ambulatory Visit: Payer: Self-pay | Admitting: Cardiology

## 2013-10-17 ENCOUNTER — Other Ambulatory Visit: Payer: Self-pay | Admitting: Internal Medicine

## 2013-10-17 DIAGNOSIS — I10 Essential (primary) hypertension: Secondary | ICD-10-CM

## 2013-10-23 ENCOUNTER — Other Ambulatory Visit: Payer: Self-pay | Admitting: Internal Medicine

## 2013-12-02 ENCOUNTER — Other Ambulatory Visit: Payer: Self-pay | Admitting: Cardiology

## 2013-12-06 IMAGING — CT CT CHEST W/O CM
2 of 3 series · 15 of 36 positions shown, 18 images · non-contrast
Comparison: 03/03/2012

CLINICAL DATA: Evaluate pulmonary nodule

CT CHEST WITHOUT CONTRAST
TECHNIQUE: Multidetector CT imaging of the chest was performed
following the standard protocol without IV contrast.

[Series 2: routine chest 5.0 st · axial · 0.63mm/px · z∈[-357,-52]mm · 12 of 73 slices shown, 15 images]
[im 6/73  mediastinal]
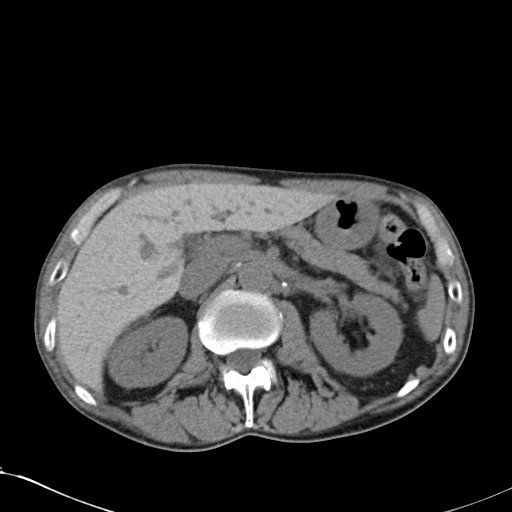
[im 6/73  lung]
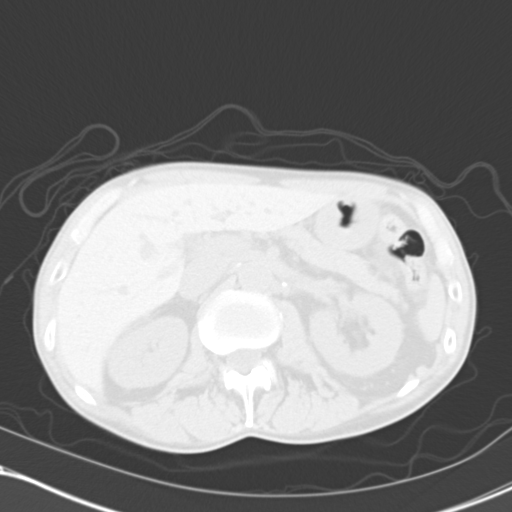
[im 11/73  lung]
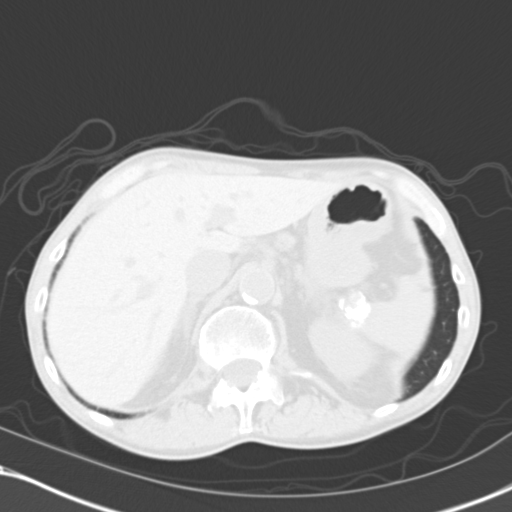
[im 17/73  lung]
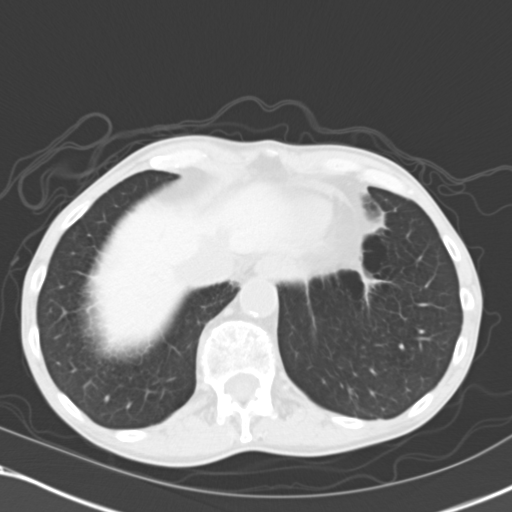
[im 22/73  lung]
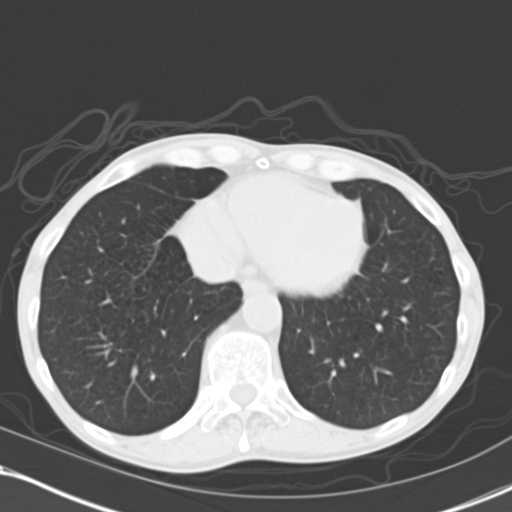
[im 27/73  mediastinal]
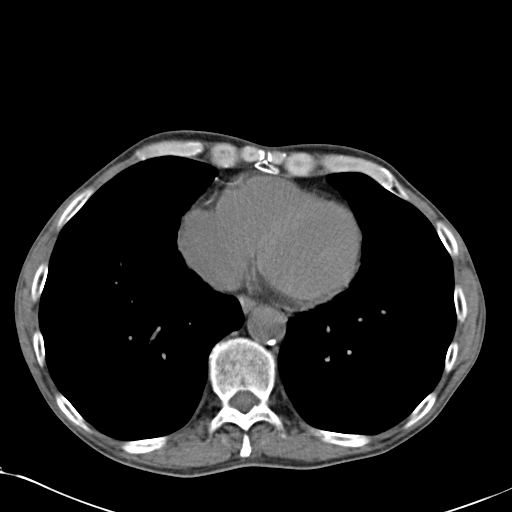
[im 27/73  lung]
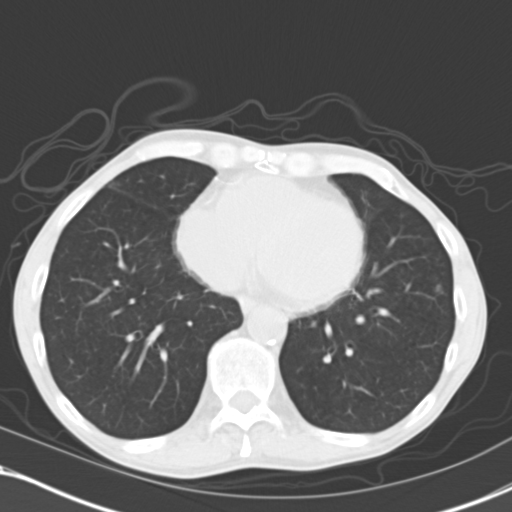
[im 33/73  lung]
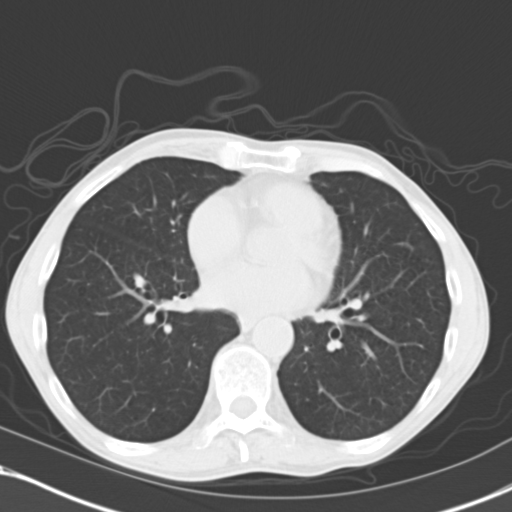
[im 41/73  lung]
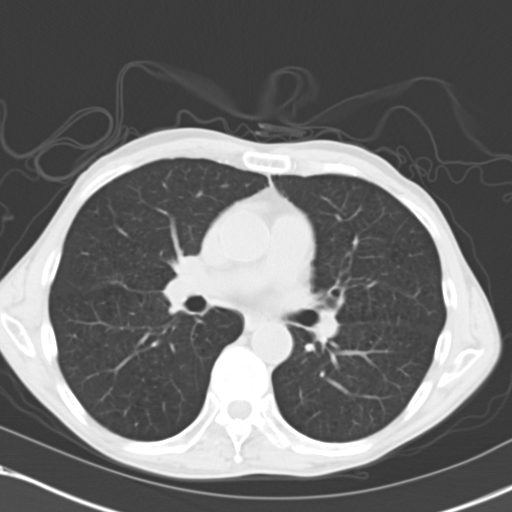
[im 46/73  lung]
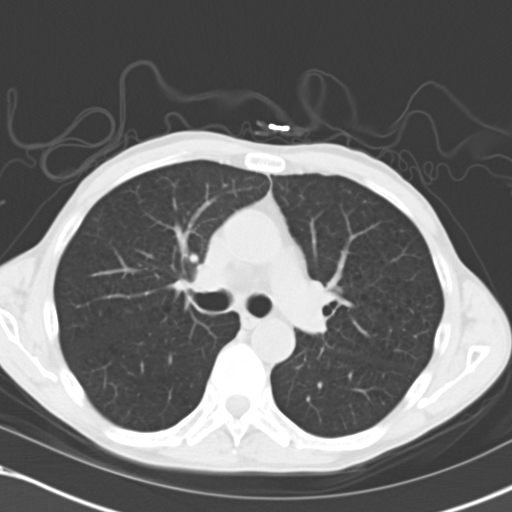
[im 51/73  mediastinal]
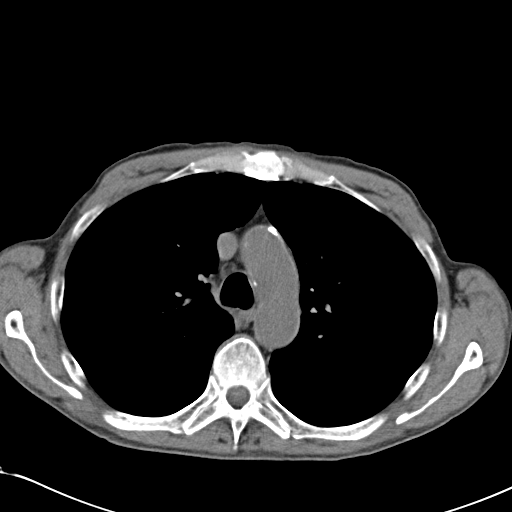
[im 51/73  lung]
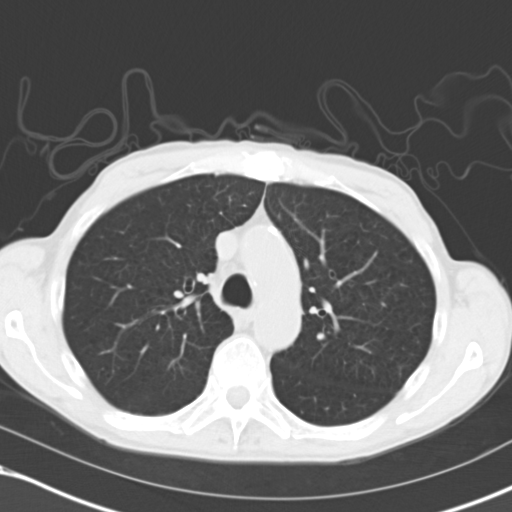
[im 57/73  lung]
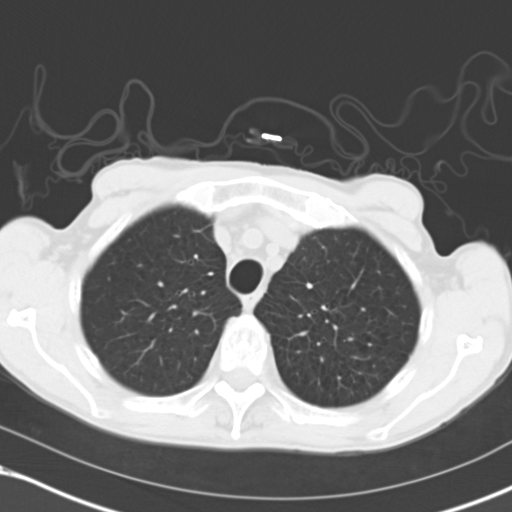
[im 62/73  lung]
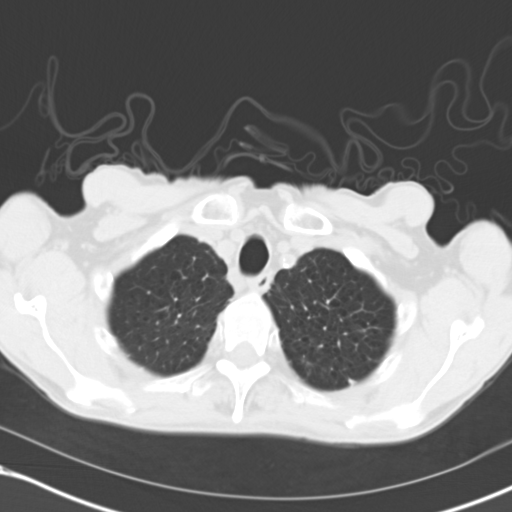
[im 67/73  lung]
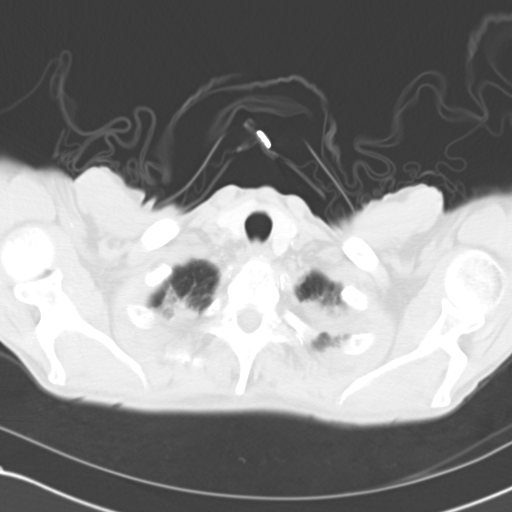

[Series 5: coronals · coronal · 0.57mm/px · 3 of 98 slices shown]
[im 20/98  lung]
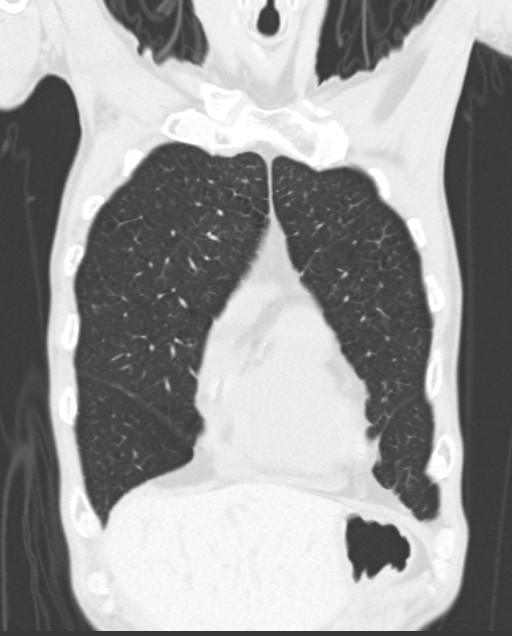
[im 39/98  lung]
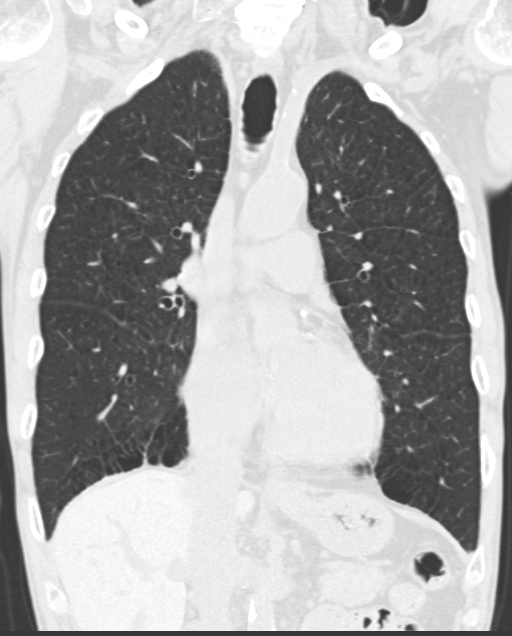
[im 59/98  lung]
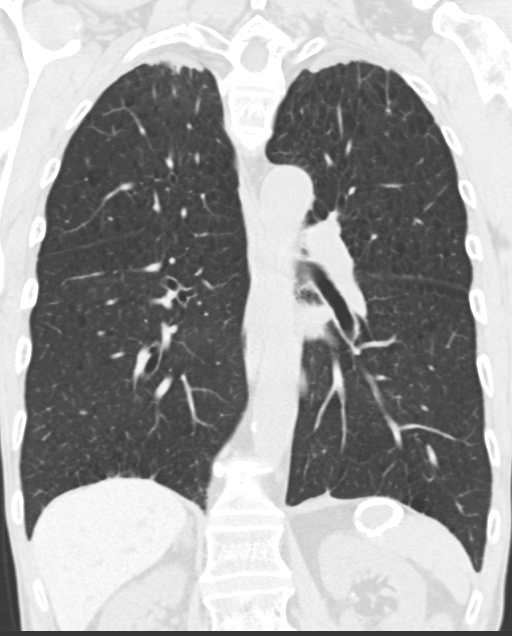

[15 of 36 positions shown; findings below may reference images not displayed]

FINDINGS: Lungs/pleura: There is no pleural effusion.  Biapical
scarring is similar to previous exam.  Pulmonary nodule within the
left lower lobe measures 8 mm, image number 49/series 3. On
12/17/2010 this measured 7 mm.  There are mild to moderate changes
of COPD/emphysema.  No airspace consolidation or atelectasis noted.
The trachea appears patent and is midline.

Heart/Mediastinum: Normal heart size.  There is calcified
atherosclerotic disease affecting the LAD, left circumflex and RCA
coronary artery.  Right paratracheal lymph node is stable measuring
1 cm, image 11/series 2.  There is no pericardial effusion.  No
enlarged axillary or supraclavicular lymph nodes.
Upper abdomen: There is a focal area of low attenuation within the
left hepatic lobe measuring 6 mm, image number 64/series 2.  There
is a peripherally calcified splenic artery aneurysm which measures
3.3 cm, image 61/series 2.  This is unchanged from previous exam.

Bones/Musculoskeletal:  Review of the visualized osseous structures
is significant for mild spondylosis.  No aggressive lytic or
sclerotic bone lesions noted.
IMPRESSION: 1.  No acute findings.
2.  Left lower lobe pulmonary parenchymal nodule is not
significantly changed in size from 07/17/2010. This is been stable
for a period of approximately 24 months and is likely benign.

3.  Stable borderline enlarged right paratracheal lymph node.
4.  Large splenic artery aneurysm.

## 2014-01-11 ENCOUNTER — Emergency Department (HOSPITAL_COMMUNITY): Payer: Medicare Other

## 2014-01-11 ENCOUNTER — Encounter (HOSPITAL_COMMUNITY): Payer: Self-pay | Admitting: Emergency Medicine

## 2014-01-11 ENCOUNTER — Emergency Department (HOSPITAL_COMMUNITY)
Admission: EM | Admit: 2014-01-11 | Discharge: 2014-01-11 | Disposition: A | Discharge status: Home / Self Care | Payer: Medicare Other | Attending: Emergency Medicine | Admitting: Emergency Medicine

## 2014-01-11 DIAGNOSIS — R42 Dizziness and giddiness: Secondary | ICD-10-CM | POA: Diagnosis not present

## 2014-01-11 DIAGNOSIS — R011 Cardiac murmur, unspecified: Secondary | ICD-10-CM | POA: Diagnosis not present

## 2014-01-11 DIAGNOSIS — Z862 Personal history of diseases of the blood and blood-forming organs and certain disorders involving the immune mechanism: Secondary | ICD-10-CM | POA: Diagnosis not present

## 2014-01-11 DIAGNOSIS — M479 Spondylosis, unspecified: Secondary | ICD-10-CM | POA: Insufficient documentation

## 2014-01-11 DIAGNOSIS — I10 Essential (primary) hypertension: Secondary | ICD-10-CM | POA: Diagnosis not present

## 2014-01-11 DIAGNOSIS — Z8669 Personal history of other diseases of the nervous system and sense organs: Secondary | ICD-10-CM | POA: Diagnosis not present

## 2014-01-11 DIAGNOSIS — Z860100 Personal history of colon polyps, unspecified: Secondary | ICD-10-CM | POA: Insufficient documentation

## 2014-01-11 DIAGNOSIS — R110 Nausea: Secondary | ICD-10-CM | POA: Insufficient documentation

## 2014-01-11 DIAGNOSIS — Z79899 Other long term (current) drug therapy: Secondary | ICD-10-CM | POA: Diagnosis not present

## 2014-01-11 DIAGNOSIS — I4891 Unspecified atrial fibrillation: Secondary | ICD-10-CM | POA: Insufficient documentation

## 2014-01-11 DIAGNOSIS — Z87448 Personal history of other diseases of urinary system: Secondary | ICD-10-CM | POA: Insufficient documentation

## 2014-01-11 DIAGNOSIS — Z8719 Personal history of other diseases of the digestive system: Secondary | ICD-10-CM | POA: Diagnosis not present

## 2014-01-11 DIAGNOSIS — R55 Syncope and collapse: Secondary | ICD-10-CM | POA: Diagnosis not present

## 2014-01-11 DIAGNOSIS — E785 Hyperlipidemia, unspecified: Secondary | ICD-10-CM | POA: Diagnosis not present

## 2014-01-11 DIAGNOSIS — Z7902 Long term (current) use of antithrombotics/antiplatelets: Secondary | ICD-10-CM | POA: Insufficient documentation

## 2014-01-11 DIAGNOSIS — R079 Chest pain, unspecified: Secondary | ICD-10-CM | POA: Diagnosis not present

## 2014-01-11 DIAGNOSIS — F17200 Nicotine dependence, unspecified, uncomplicated: Secondary | ICD-10-CM | POA: Diagnosis not present

## 2014-01-11 LAB — CBC
HCT: 38.6 % — ABNORMAL LOW (ref 39.0–52.0)
Hemoglobin: 13.2 g/dL (ref 13.0–17.0)
MCH: 33.2 pg (ref 26.0–34.0)
MCHC: 34.2 g/dL (ref 30.0–36.0)
MCV: 97 fL (ref 78.0–100.0)
Platelets: 252 10*3/uL (ref 150–400)
RBC: 3.98 MIL/uL — ABNORMAL LOW (ref 4.22–5.81)
RDW: 13.6 % (ref 11.5–15.5)
WBC: 7.8 10*3/uL (ref 4.0–10.5)

## 2014-01-11 LAB — BASIC METABOLIC PANEL
BUN: 9 mg/dL (ref 6–23)
CO2: 24 mEq/L (ref 19–32)
Calcium: 10.5 mg/dL (ref 8.4–10.5)
Chloride: 102 mEq/L (ref 96–112)
Creatinine, Ser: 1.33 mg/dL (ref 0.50–1.35)
GFR calc Af Amer: 60 mL/min — ABNORMAL LOW (ref >90)
GFR calc non Af Amer: 52 mL/min — ABNORMAL LOW (ref >90)
Glucose, Bld: 99 mg/dL (ref 70–99)
Potassium: 4.3 mEq/L (ref 3.7–5.3)
Sodium: 140 mEq/L (ref 137–147)

## 2014-01-11 LAB — POCT I-STAT TROPONIN I: Troponin i, poc: 0.01 ng/mL (ref 0.00–0.08)

## 2014-01-11 LAB — PRO B NATRIURETIC PEPTIDE: Pro B Natriuretic peptide (BNP): 220.6 pg/mL — ABNORMAL HIGH (ref 0–125)

## 2014-01-11 MED ORDER — LORAZEPAM 2 MG/ML IJ SOLN
1.0000 mg | INTRAMUSCULAR | Status: AC
  Administered 2014-01-11: 1 mg via INTRAVENOUS
  Filled 2014-01-11: qty 1

## 2014-01-11 MED ORDER — MECLIZINE HCL 25 MG PO TABS
25.0000 mg | ORAL_TABLET | Freq: Once | ORAL | Status: AC
  Administered 2014-01-11: 25 mg via ORAL
  Filled 2014-01-11: qty 1

## 2014-01-11 MED ORDER — SODIUM CHLORIDE 0.9 % IV BOLUS (SEPSIS)
500.0000 mL | Freq: Once | INTRAVENOUS | Status: AC
  Administered 2014-01-11: 500 mL via INTRAVENOUS

## 2014-01-11 MED ORDER — MECLIZINE HCL 25 MG PO TABS: 25.0000 mg | ORAL_TABLET | Freq: Three times a day (TID) | ORAL | Status: DC | PRN

## 2014-01-11 NOTE — ED Notes (Signed)
Pt states for past several days hes been dizzy then today he noticed R sided CP radiating into back when he breathes. Reports he feels SOB. States he has not had any LOC or falls he has been able to lie in the bed when hes felt dizzy. A&ox4, resp e/u

## 2014-01-11 NOTE — Discharge Instructions (Signed)

## 2014-01-11 NOTE — ED Notes (Signed)
Patient transported to MRI 
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
## 2014-01-11 NOTE — ED Provider Notes (Signed)
  Face-to-face evaluation   History: Intermittent vertigo, since this morning. Occasional headaches for several days. He also has mild, chest discomfort, without cough, or shortness of breath. He's never had vertigo, previously.  Physical exam: Alert, elderly man in mild distress. He has bilateral direction changing nystagmus. Ears normal strength in the arms, and legs, bilaterally. There is no facial asymmetry. TMs are normal bilaterally.  Medical screening examination/treatment/procedure(s) were conducted as a shared visit with non-physician practitioner(s) and myself.  I personally evaluated the patient during the encounter  Richarda Blade, MD 01/11/14 2013

## 2014-01-24 ENCOUNTER — Other Ambulatory Visit: Payer: Self-pay | Admitting: Internal Medicine

## 2014-01-26 ENCOUNTER — Other Ambulatory Visit: Payer: Self-pay | Admitting: Internal Medicine

## 2014-03-01 ENCOUNTER — Other Ambulatory Visit: Payer: Self-pay | Admitting: *Deleted

## 2014-03-02 ENCOUNTER — Encounter (INDEPENDENT_AMBULATORY_CARE_PROVIDER_SITE_OTHER): Payer: Self-pay

## 2014-03-02 ENCOUNTER — Ambulatory Visit (INDEPENDENT_AMBULATORY_CARE_PROVIDER_SITE_OTHER): Payer: Medicare Other | Admitting: Cardiology

## 2014-03-02 ENCOUNTER — Encounter: Payer: Self-pay | Admitting: Cardiology

## 2014-03-02 ENCOUNTER — Other Ambulatory Visit: Payer: Self-pay | Admitting: *Deleted

## 2014-03-02 VITALS — BP 134/62 | HR 57 | Wt 105.0 lb

## 2014-03-02 DIAGNOSIS — R0989 Other specified symptoms and signs involving the circulatory and respiratory systems: Secondary | ICD-10-CM

## 2014-03-02 DIAGNOSIS — I739 Peripheral vascular disease, unspecified: Secondary | ICD-10-CM

## 2014-03-02 DIAGNOSIS — I6529 Occlusion and stenosis of unspecified carotid artery: Secondary | ICD-10-CM | POA: Diagnosis not present

## 2014-03-02 DIAGNOSIS — I4891 Unspecified atrial fibrillation: Secondary | ICD-10-CM | POA: Diagnosis not present

## 2014-03-02 DIAGNOSIS — Z720 Tobacco use: Secondary | ICD-10-CM

## 2014-03-02 DIAGNOSIS — E785 Hyperlipidemia, unspecified: Secondary | ICD-10-CM

## 2014-03-02 DIAGNOSIS — I2510 Atherosclerotic heart disease of native coronary artery without angina pectoris: Secondary | ICD-10-CM

## 2014-03-02 DIAGNOSIS — F17200 Nicotine dependence, unspecified, uncomplicated: Secondary | ICD-10-CM

## 2014-03-02 LAB — HEPATIC FUNCTION PANEL
ALT: 39 U/L (ref 0–53)
AST: 35 U/L (ref 0–37)
Albumin: 3.9 g/dL (ref 3.5–5.2)
Alkaline Phosphatase: 85 U/L (ref 39–117)
Bilirubin, Direct: 0 mg/dL (ref 0.0–0.3)
Total Bilirubin: 0.5 mg/dL (ref 0.3–1.2)
Total Protein: 6.9 g/dL (ref 6.0–8.3)

## 2014-03-02 LAB — LIPID PANEL
Cholesterol: 120 mg/dL (ref 0–200)
HDL: 56.1 mg/dL (ref >39.00)
LDL Cholesterol: 51 mg/dL (ref 0–99)
Total CHOL/HDL Ratio: 2
Triglycerides: 63 mg/dL (ref 0.0–149.0)
VLDL: 12.6 mg/dL (ref 0.0–40.0)

## 2014-03-02 LAB — CBC
HCT: 37.1 % — ABNORMAL LOW (ref 39.0–52.0)
Hemoglobin: 12 g/dL — ABNORMAL LOW (ref 13.0–17.0)
MCHC: 32.4 g/dL (ref 30.0–36.0)
MCV: 99.4 fl (ref 78.0–100.0)
Platelets: 274 10*3/uL (ref 150.0–400.0)
RBC: 3.73 Mil/uL — ABNORMAL LOW (ref 4.22–5.81)
RDW: 14.1 % (ref 11.5–14.6)
WBC: 7.5 10*3/uL (ref 4.5–10.5)

## 2014-03-02 LAB — TSH: TSH: 0.21 u[IU]/mL — ABNORMAL LOW (ref 0.35–5.50)

## 2014-03-02 NOTE — Patient Instructions (Signed)
Your physician has requested that you have a carotid duplex. This test is an ultrasound of the carotid arteries in your neck. It looks at blood flow through these arteries that supply the brain with blood. Allow one hour for this exam. There are no restrictions or special instructions.  Your physician has requested that you have a lower extremity arterial exercise duplex. During this test, exercise and ultrasound are used to evaluate arterial blood flow in the legs. Allow one hour for this exam. There are no restrictions or special instructions.  Your physician has recommended that you have a mesenteric duplex.  Your physician recommends that you return for lab work today for cbc, lipid, tsh and hepatic.  Your physician wants you to follow-up in: 6 months with Dr. Aundra Dubin. You will receive a reminder letter in the mail two months in advance. If you don't receive a letter, please call our office to schedule the follow-up appointment.
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
## 2014-03-05 ENCOUNTER — Other Ambulatory Visit: Payer: Self-pay | Admitting: *Deleted

## 2014-03-05 DIAGNOSIS — R7989 Other specified abnormal findings of blood chemistry: Secondary | ICD-10-CM

## 2014-03-07 ENCOUNTER — Other Ambulatory Visit: Payer: Self-pay | Admitting: *Deleted

## 2014-03-07 DIAGNOSIS — R0989 Other specified symptoms and signs involving the circulatory and respiratory systems: Secondary | ICD-10-CM

## 2014-03-07 DIAGNOSIS — I739 Peripheral vascular disease, unspecified: Secondary | ICD-10-CM

## 2014-03-09 ENCOUNTER — Encounter (HOSPITAL_COMMUNITY): Payer: Medicare Other

## 2014-03-09 ENCOUNTER — Other Ambulatory Visit: Payer: Medicare Other

## 2014-03-14 ENCOUNTER — Other Ambulatory Visit (HOSPITAL_COMMUNITY): Payer: Self-pay | Admitting: Cardiology

## 2014-03-14 DIAGNOSIS — I739 Peripheral vascular disease, unspecified: Secondary | ICD-10-CM

## 2014-03-15 ENCOUNTER — Other Ambulatory Visit (HOSPITAL_COMMUNITY): Payer: Self-pay | Admitting: Cardiology

## 2014-03-15 DIAGNOSIS — R109 Unspecified abdominal pain: Secondary | ICD-10-CM

## 2014-03-20 ENCOUNTER — Ambulatory Visit (HOSPITAL_COMMUNITY): Payer: Medicare Other | Attending: Cardiology | Admitting: Cardiology

## 2014-03-20 ENCOUNTER — Ambulatory Visit (HOSPITAL_BASED_OUTPATIENT_CLINIC_OR_DEPARTMENT_OTHER): Payer: Medicare Other | Admitting: Cardiology

## 2014-03-20 ENCOUNTER — Other Ambulatory Visit (INDEPENDENT_AMBULATORY_CARE_PROVIDER_SITE_OTHER): Payer: Medicare Other

## 2014-03-20 DIAGNOSIS — R109 Unspecified abdominal pain: Secondary | ICD-10-CM | POA: Diagnosis not present

## 2014-03-20 DIAGNOSIS — I4891 Unspecified atrial fibrillation: Secondary | ICD-10-CM

## 2014-03-20 DIAGNOSIS — I6529 Occlusion and stenosis of unspecified carotid artery: Secondary | ICD-10-CM | POA: Diagnosis not present

## 2014-03-20 DIAGNOSIS — E785 Hyperlipidemia, unspecified: Secondary | ICD-10-CM

## 2014-03-20 DIAGNOSIS — R946 Abnormal results of thyroid function studies: Secondary | ICD-10-CM | POA: Diagnosis not present

## 2014-03-20 DIAGNOSIS — R7989 Other specified abnormal findings of blood chemistry: Secondary | ICD-10-CM

## 2014-03-20 DIAGNOSIS — R0989 Other specified symptoms and signs involving the circulatory and respiratory systems: Secondary | ICD-10-CM

## 2014-03-20 DIAGNOSIS — I739 Peripheral vascular disease, unspecified: Secondary | ICD-10-CM | POA: Insufficient documentation

## 2014-03-20 DIAGNOSIS — I428 Other cardiomyopathies: Secondary | ICD-10-CM | POA: Diagnosis not present

## 2014-03-20 LAB — LIPID PANEL
Cholesterol: 122 mg/dL (ref 0–200)
HDL: 50.2 mg/dL (ref >39.00)
LDL Cholesterol: 62 mg/dL (ref 0–99)
Total CHOL/HDL Ratio: 2
Triglycerides: 51 mg/dL (ref 0.0–149.0)
VLDL: 10.2 mg/dL (ref 0.0–40.0)

## 2014-03-20 LAB — T4, FREE: Free T4: 2.73 ng/dL — ABNORMAL HIGH (ref 0.60–1.60)

## 2014-03-20 LAB — T3, FREE: T3, Free: 3.8 pg/mL (ref 2.3–4.2)

## 2014-03-20 LAB — TSH: TSH: 0.16 u[IU]/mL — ABNORMAL LOW (ref 0.35–5.50)

## 2014-03-20 NOTE — Progress Notes (Signed)
Carotid duplex complete 

## 2014-03-20 NOTE — Progress Notes (Signed)
Mesenteric duplex complete 

## 2014-03-20 NOTE — Progress Notes (Signed)
Ankle brachial index complete

## 2014-04-01 ENCOUNTER — Other Ambulatory Visit: Payer: Self-pay | Admitting: Cardiology

## 2014-04-20 ENCOUNTER — Ambulatory Visit: Payer: Medicare Other | Admitting: Internal Medicine

## 2014-04-24 ENCOUNTER — Other Ambulatory Visit: Payer: Self-pay | Admitting: Internal Medicine

## 2014-05-03 ENCOUNTER — Other Ambulatory Visit: Payer: Self-pay | Admitting: Cardiology

## 2014-05-21 ENCOUNTER — Other Ambulatory Visit: Payer: Self-pay | Admitting: Internal Medicine

## 2014-05-21 DIAGNOSIS — K259 Gastric ulcer, unspecified as acute or chronic, without hemorrhage or perforation: Secondary | ICD-10-CM

## 2014-05-25 ENCOUNTER — Encounter: Payer: Self-pay | Admitting: Internal Medicine

## 2014-05-25 ENCOUNTER — Ambulatory Visit (INDEPENDENT_AMBULATORY_CARE_PROVIDER_SITE_OTHER): Payer: Medicare Other | Admitting: Internal Medicine

## 2014-05-25 VITALS — BP 138/62 | HR 60 | Temp 97.4°F | Wt 100.5 lb

## 2014-05-25 DIAGNOSIS — Z720 Tobacco use: Secondary | ICD-10-CM

## 2014-05-25 DIAGNOSIS — I10 Essential (primary) hypertension: Secondary | ICD-10-CM | POA: Diagnosis not present

## 2014-05-25 DIAGNOSIS — K259 Gastric ulcer, unspecified as acute or chronic, without hemorrhage or perforation: Secondary | ICD-10-CM | POA: Diagnosis not present

## 2014-05-25 DIAGNOSIS — E785 Hyperlipidemia, unspecified: Secondary | ICD-10-CM

## 2014-05-25 DIAGNOSIS — D126 Benign neoplasm of colon, unspecified: Secondary | ICD-10-CM

## 2014-05-25 DIAGNOSIS — F17200 Nicotine dependence, unspecified, uncomplicated: Secondary | ICD-10-CM | POA: Diagnosis not present

## 2014-05-25 DIAGNOSIS — I4891 Unspecified atrial fibrillation: Secondary | ICD-10-CM

## 2014-05-25 DIAGNOSIS — T462X5A Adverse effect of other antidysrhythmic drugs, initial encounter: Secondary | ICD-10-CM

## 2014-05-25 DIAGNOSIS — E0580 Other thyrotoxicosis without thyrotoxic crisis or storm: Secondary | ICD-10-CM | POA: Insufficient documentation

## 2014-05-25 DIAGNOSIS — G8911 Acute pain due to trauma: Secondary | ICD-10-CM | POA: Diagnosis not present

## 2014-05-25 DIAGNOSIS — Z0000 Encounter for general adult medical examination without abnormal findings: Secondary | ICD-10-CM

## 2014-05-25 DIAGNOSIS — M25511 Pain in right shoulder: Secondary | ICD-10-CM

## 2014-05-25 DIAGNOSIS — M25519 Pain in unspecified shoulder: Secondary | ICD-10-CM | POA: Diagnosis not present

## 2014-05-25 DIAGNOSIS — I739 Peripheral vascular disease, unspecified: Secondary | ICD-10-CM | POA: Diagnosis not present

## 2014-05-25 HISTORY — DX: Other thyrotoxicosis without thyrotoxic crisis or storm: E05.80

## 2014-05-25 HISTORY — DX: Adverse effect of other antidysrhythmic drugs, initial encounter: T46.2X5A

## 2014-05-25 NOTE — Assessment & Plan Note (Signed)
He has had no epigastric discomfort on the pantoprazole. He wishes to discontinue any unnecessary medications and we decided to stop the pantoprazole and assess if he should have any epigastric discomfort after doing so.

## 2014-05-25 NOTE — Assessment & Plan Note (Signed)
Daniel [name] notes unintentional weight loss since he was last seen. He denies any agitation, jitteriness, nervousness, palpitations, shortness of breath, or diarrhea. Within the last 2 months he had a free T4 that was elevated at 2.73 and a decreased TSH at 0.16 with a normal free T3. Physical exam revealed a regular rhythm without tachycardia and no goiter or thyroid nodules. I am concerned that his unintentional weight loss may be related to the hyperthyroidism likely secondary to his amiodarone. 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 As his amiodarone was started for an atrial arrhythmia, specifically atrial fibrillation, it may be possible to change to an alternative medication to control his atrial rhythm. This is unfortunate given his excellent response to the amiodarone, but probably necessary given his unintentional weight loss and the hyperthyroidism seen on recent blood work. I'm hesitant to make any changes in his anti-arrhythmia therapy without the input from his cardiologist Dr. Marigene Ehlers. I will therefore send a message to Dr. Marigene Ehlers asking him to reassess his amiodarone therapy and consider alternative antiarrhythmics if appropriate. In the meantime, we will continue the amiodarone, as the hyperthyroidism could worsen with its abrupt discontinuation. We will also continue the metoprolol which is keeping his rate under excellent control. I will hold off on repeating the PFTs at this time, because if the amiodarone is stopped they would not be necessary. He continues to follow with Dr. Bing Plume in ophthalmology. As far as the skin discoloration of his left ear, which is consistent with amiodarone-induced changes, it was recommended that he use sunscreen when outdoors, including when he's driving.

## 2014-05-25 NOTE — Progress Notes (Signed)
   Subjective:    Patient ID: Daniel [name], male    DOB: [birth year], 73 y.o.   MRN: [id number]  HPI  Please see the A&P for the status of the pt's chronic medical problems.  Review of Systems  Constitutional: Positive for unexpected weight change. Negative for fever, diaphoresis, activity change, appetite change and fatigue.  Eyes: Negative for pain, redness and visual disturbance.  Respiratory: Negative for cough, chest tightness, shortness of breath and wheezing.   Cardiovascular: Negative for chest pain, palpitations and leg swelling.  Gastrointestinal: Negative for nausea, vomiting, abdominal pain, diarrhea, constipation and abdominal distention.  Skin: Positive for color change. Negative for rash and wound.       Left ear with grey hue (Ear exposed to sunlight when he drives).  Has not used sunscreen despite being on amiodarone.  Neurological: Negative for dizziness, syncope, weakness and light-headedness.  Psychiatric/Behavioral: Negative for sleep disturbance, decreased concentration and agitation. The patient is not nervous/anxious and is not hyperactive.       Objective:   Physical Exam  Nursing note and vitals reviewed. Constitutional: He is oriented to person, place, and time. He appears well-developed and well-nourished. No distress.  HENT:  Head: Normocephalic and atraumatic.  Eyes: Conjunctivae are normal. Right eye exhibits no discharge. Left eye exhibits no discharge. No scleral icterus.  Neck: Normal range of motion. Neck supple. No tracheal deviation present. No thyromegaly present.  No thyroid nodules or goiter  Cardiovascular: Normal rate, regular rhythm and normal heart sounds.  Exam reveals no gallop and no friction rub.   No murmur heard. Pulmonary/Chest: Effort normal and breath sounds normal. No stridor. No respiratory distress. He has no wheezes. He has no rales.  Abdominal: Soft. Bowel sounds are normal. He exhibits no distension. There is no tenderness.  There is no rebound and no guarding.  Musculoskeletal: Normal range of motion. He exhibits no edema and no tenderness.  Neurological: He is alert and oriented to person, place, and time. He exhibits normal muscle tone.  Skin: Skin is warm and dry. No rash noted. He is not diaphoretic. No erythema.  Left ear with grey hue consistent with amiodarone skin toxicity  Psychiatric: He has a normal mood and affect. His behavior is normal. Judgment and thought content normal.      Assessment & Plan:   Please see problem oriented charting.

## 2014-05-25 NOTE — Assessment & Plan Note (Signed)
He is not interested in the Pneumovax, tetanus booster, or Zostavax at this time. We discussed the reasons for this and he is most concerned about the costs. We will reassess his stance on preventative health measures, particularly immunizations, at the followup visit.

## 2014-05-25 NOTE — Patient Instructions (Addendum)
It was great to see you again.  I am sorry you are loosing weight without trying.  I think it is because you have hyperthyroidism (over active thyroid gland).  This is causing your metabolism to run faster and you are burning more calories.  The amiodarone may be doing this.  1) I will contact Dr. Aundra Dubin and ask him if there is another medication we can give you for your heart that won't interact with your thyroid gland like this.  He may contact you for another appointment to discuss this.  2) We stopped your amlodipine, furosimide, and pantoprazole as you may no longer need these medications.  3) Please keep taking all of the other medications as you are.  Do not stop any without asking your doctor as this could be dangerous with an over active thyroid.  4) I will hold off on further testing until we hear back from Dr. Aundra Dubin about the amiodarone and if this will be continued or changed.  If it is changed, further testing will not be necessary.  5) At the next visit I will re discuss the issue of the colonoscopy, but I want to hold off until we fix the thyroid problem.  6) Check your blood pressure at home.  I want the top number to be less than 150.  If it stays above 150 then restart the amlodipine.  I will see you back in 3 months, sooner if necessary.

## 2014-05-25 NOTE — Assessment & Plan Note (Signed)
The importance of tobacco cessation was again discussed with the patient. He remains in the pre-contemplative stage and is not mentally ready to make an attempt at quitting. He was told this would be readdressed at the followup visit and that he should contact me at any time should he ever desire pharmacologic help with his smoking cessation.

## 2014-05-25 NOTE — Assessment & Plan Note (Signed)
His right shoulder pain has resolved with the home physical therapy that he was doing. He has stopped the Percocet altogether given the resolution of his pain.

## 2014-05-25 NOTE — Assessment & Plan Note (Signed)
His LDL was 62 at the end of March. He is tolerating the atorvastatin 20 mg by mouth daily without myalgias. We will therefore continue the atorvastatin at the current dose.

## 2014-05-25 NOTE — Assessment & Plan Note (Signed)
On exam today his rhythm was regular suggesting he remains in normal sinus rhythm. His rate was also controlled on the metoprolol XL 75 mg daily. 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 As noted above, I will send a note to Dr. Marigene Ehlers to reassess Mr. Birchall for alternative therapies to control his atrial arrhythmia. In the meantime, he will continue the amiodarone 200 mg by mouth daily, metoprolol 75 mg by mouth daily, and Permax a 150 mg by mouth twice daily.

## 2014-05-25 NOTE — Assessment & Plan Note (Signed)
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 Once his hyperthyroidism is controlled we will refer him to GI to consider colonoscopy surveillance.

## 2014-05-25 NOTE — Assessment & Plan Note (Signed)
His blood pressure was at target today at 138/62. He was very interested in stopping some of his medications. The amlodipine 5 mg by mouth daily was therefore stopped. He was asked to continue the enalapril 10 mg by mouth twice daily and metoprolol XL 75 mg by mouth daily. The reason to continue the latter 2 medications has to do with his history of cardiomyopathy and atrial fibrillation. He will continue to follow his blood pressure at home and if the systolic blood pressure is consistently above 150 he will restart the amlodipine at 5 mg by mouth daily. The Lasix was also stopped. We will reassess his blood pressure at the followup visit.

## 2014-05-25 NOTE — Assessment & Plan Note (Signed)
As noted above, he continues to smoke and is not interested in quitting at this time. His blood pressure and lipid profile are at target. We will continue aggressive risk factor modification and offer assistance in smoking cessation at each followup visit.

## 2014-05-31 ENCOUNTER — Encounter: Payer: Self-pay | Admitting: Internal Medicine

## 2014-05-31 DIAGNOSIS — I4891 Unspecified atrial fibrillation: Secondary | ICD-10-CM

## 2014-05-31 MED ORDER — DRONEDARONE HCL 400 MG PO TABS: 400.0000 mg | ORAL_TABLET | Freq: Two times a day (BID) | ORAL | Status: DC

## 2014-05-31 NOTE — Progress Notes (Signed)
Amiodarone likely causing hyperthyroidism.  Contacted Dr. Aundra Dubin who recommended stopping the amiodarone and starting dronedarone 400 mg by mouth twice daily.  I called [name] to notify him of this change and to remind him to pick this new medication up as soon as possible.

## 2014-06-04 ENCOUNTER — Other Ambulatory Visit: Payer: Self-pay | Admitting: Cardiology

## 2014-06-04 ENCOUNTER — Telehealth: Payer: Self-pay | Admitting: *Deleted

## 2014-06-04 ENCOUNTER — Other Ambulatory Visit: Payer: Self-pay | Admitting: Internal Medicine

## 2014-06-04 ENCOUNTER — Telehealth: Payer: Self-pay | Admitting: Cardiology

## 2014-06-04 DIAGNOSIS — E785 Hyperlipidemia, unspecified: Secondary | ICD-10-CM

## 2014-06-04 MED ORDER — DRONEDARONE HCL 400 MG PO TABS: 400.0000 mg | ORAL_TABLET | Freq: Two times a day (BID) | ORAL | Status: DC

## 2014-06-04 MED ORDER — ATORVASTATIN CALCIUM 20 MG PO TABS: 20.0000 mg | ORAL_TABLET | Freq: Every day | ORAL | Status: DC

## 2014-06-04 NOTE — Telephone Encounter (Signed)
Message copied by Katrine Coho on Mon Jun 04, 2014  2:08 PM ------      Message from: Larey Dresser      Created: Sat May 26, 2014  9:21 PM      Regarding: FW: Hyperthyroidism in Patient on Amiodarone       He can stop amiodarone.  No CHF with EF normalized now so could take dronedarone 400 mg bid.       ----- Message -----         From: Karren Cobble, MD         Sent: 05/26/2014  11:51 AM           To: Larey Dresser, MD      Subject: Hyperthyroidism in Patient on Amiodarone                 Dear Dr. Aundra Dubin,            My name is Terressa Koyanagi and I am [name] PCP.  I saw him in my clinic yesterday and he was complaining of unintentional weight loss.  Review of his labs revealed an elevated free T4 and a depressed TSH in March.  Exam revealed a regular heart rate without tachycardia or other stigmata of significant hyperthyroidism.  He was without a thyroid goiter or nodules.            He was initially placed on Amiodarone for an atrial arrythmia and has done well on this therapy prior to now.  Are there alternative medications that could be used to maintain sinus rhythm given what I believe to be symptomatic hyperthyroidism on the amiodarone (unintentional weight loss)?            Thanks for your advice.            Sincerely,                  Karren Cobble, MD       ------

## 2014-06-04 NOTE — Telephone Encounter (Signed)
Call from pt - States he was taken off his heart medication, Amiodarone, b/c it was interferrng w/ his thyroid. States Dr Eppie Gibson was going to talk to his heart doctor to see what other medication he can take. And he had called his pharmacy but no medication had been called in. So, he wanted to know what had been decided? Thanks

## 2014-06-05 NOTE — Telephone Encounter (Signed)
Pt states he talked to Dr Claris Gladden nurse yesterday about the new medication. Also talked to [facility] who stated the medication should be here today around 2 or 3 PM apparently it had be ordered.

## 2014-06-05 NOTE — Telephone Encounter (Signed)
I called Daniel [name] and explained the change to him over the phone on 05/31/2014.  Please see the documentation from that date.  I prescribed the new medication and sent it to the pharmacy (please see the documentation).  It appears this medication was discontinued by Dr. Aundra Dubin and rewritten.  Please call patient to remind him of this and inform him that the medication should be at the pharmacy.  Please make sure he is aware what his preferred pharmacy so he is going to the right one.  Thanks.

## 2014-06-26 ENCOUNTER — Encounter: Payer: Self-pay | Admitting: Internal Medicine

## 2014-06-26 DIAGNOSIS — I4891 Unspecified atrial fibrillation: Secondary | ICD-10-CM

## 2014-06-26 MED ORDER — WARFARIN SODIUM 5 MG PO TABS: 5.0000 mg | ORAL_TABLET | Freq: Every day | ORAL | Status: DC

## 2014-06-26 NOTE — Progress Notes (Signed)
Amiodarone was switched to multaq secondary to hyperthyroidism felt to be precipitated by amiodarone.  Unfortunately, multaq interacts with pradaxa and there is no way to monitor this at this time so we are left with switching anticoagulation to warfarin for his atrial fibrillation.  I called Daniel [name] and we discussed this.  He is willing to switch his pradaxa to coumadin given this information.  He will take his last dose of pradaxa on Wednesday and start coumadin 5 mg daily starting on Thursday.  He has an appointment to see Dr. Elie Confer in the Anticoagulation Clinic on Monday at 11:30 AM.  Further adjustments in his dose will be made thereafter.

## 2014-06-28 ENCOUNTER — Telehealth: Payer: Self-pay | Admitting: *Deleted

## 2014-06-28 NOTE — Telephone Encounter (Signed)
Returned pt's call - stated Pradaxa was stopped and started on Coumadin  - and [facility] had not received the rx. I called [facility] -stated they did they received it; verbal order given to the pharmacist. Pt called/informed.

## 2014-07-02 ENCOUNTER — Ambulatory Visit (INDEPENDENT_AMBULATORY_CARE_PROVIDER_SITE_OTHER): Payer: Medicare Other | Admitting: Pharmacist

## 2014-07-02 DIAGNOSIS — Z7901 Long term (current) use of anticoagulants: Secondary | ICD-10-CM

## 2014-07-02 DIAGNOSIS — I4891 Unspecified atrial fibrillation: Secondary | ICD-10-CM

## 2014-07-02 DIAGNOSIS — Z7902 Long term (current) use of antithrombotics/antiplatelets: Secondary | ICD-10-CM

## 2014-07-02 LAB — POCT INR: INR: 4

## 2014-07-02 NOTE — Progress Notes (Addendum)
Anti-Coagulation Progress Note  Daniel [name] is a 73 y.o. male who is currently on an anti-coagulation regimen.    RECENT RESULTS: Recent results are below, the most recent result is correlated with a dose of 5mg  by mouth daily that commenced last Friday, i.e. He has had 15mg  over 3 days (5mg /day).  He states he misunderstood his instructions regarding amiodarone--and had NOT yet stopped it (taking Multaq concomitantly). We reviewed his medications again and I reiterated by careful read of Dr. Caroline More note which had documented very thoroughly the intended plan of discontinuation of amiodarone due to impact upon his thyroid. Commence multaq (done). And commence warfarin last Thursday (which was actually started the following day). I have with the patient's permission removed the amiodarone bottle from his pill bag he brings to clinic. Will reduce dose to reflect the long-half-life of amiodarone (now discontinued). Will see him in one week.  Lab Results  Component Value Date   INR 4.0 07/02/2014   INR 1.34 07/28/2012   INR 1.28 07/06/2012    ANTI-COAG DOSE: Anticoagulation Dose Instructions as of 07/02/2014     [name] Tue Wed Thu Fri Sat   New Dose 5 mg 2.5 mg 2.5 mg 5 mg 2.5 mg 2.5 mg 5 mg       ANTICOAG SUMMARY: Anticoagulation Episode Summary   Current INR goal 2.0-3.0  Next INR check 07/09/2014  INR from last check 4.0! (07/02/2014)  Weekly max dose   Target end date   INR check location   Preferred lab   Send INR reminders to    Indications  Atrial fibrillation status post cardioversion [427.31] Encounter for long-term (current) use of antiplatelets/antithrombotics [V58.63]        Comments         ANTICOAG TODAY: Anticoagulation Summary as of 07/02/2014   INR goal 2.0-3.0  Selected INR 4.0! (07/02/2014)  Next INR check 07/09/2014  Target end date    Indications  Atrial fibrillation status post cardioversion [427.31] Encounter for long-term (current) use of  antiplatelets/antithrombotics [V58.63]      Anticoagulation Episode Summary   INR check location    Preferred lab    Send INR reminders to    Comments       PATIENT INSTRUCTIONS: Patient Instructions  Patient instructed to take medications as defined in the Anti-coagulation Track section of this encounter.  Patient instructed to take today's dose.  Patient instructed to DISCONTINUE amiodarone. Bottle was removed from his pill bag.  Patient verbalized understanding of these instructions.       FOLLOW-UP Return in 7 days (on 07/09/2014) for Follow up INR at 1130h.  Jorene Guest, III Pharm.D., CACP  Patient had TAKEN today's dose of warfarin (5mg ). As such--he will OMIT/HOLD tomorrow's dose (Tuesday, July 14); on Wednesday/Thursday--he will take 1/2 x 5mg  (2.5mg ) warfarin; on Friday July 17 he will take 5mg  warfarin; on Saturday he will take 1/2 x 5mg  (2.5mg ) and on Sunday he will take 5mg . Next Monday July 20 he will RTC for repeat INR.

## 2014-07-02 NOTE — Patient Instructions (Signed)
Patient instructed to take medications as defined in the Anti-coagulation Track section of this encounter.  Patient instructed to take today's dose.  Patient instructed to DISCONTINUE amiodarone. Bottle was removed from his pill bag.  Patient verbalized understanding of these instructions.

## 2014-07-09 ENCOUNTER — Ambulatory Visit (INDEPENDENT_AMBULATORY_CARE_PROVIDER_SITE_OTHER): Payer: Medicare Other | Admitting: Pharmacist

## 2014-07-09 DIAGNOSIS — Z7901 Long term (current) use of anticoagulants: Secondary | ICD-10-CM | POA: Diagnosis not present

## 2014-07-09 DIAGNOSIS — Z7902 Long term (current) use of antithrombotics/antiplatelets: Secondary | ICD-10-CM

## 2014-07-09 DIAGNOSIS — I739 Peripheral vascular disease, unspecified: Secondary | ICD-10-CM | POA: Diagnosis not present

## 2014-07-09 DIAGNOSIS — F17200 Nicotine dependence, unspecified, uncomplicated: Secondary | ICD-10-CM | POA: Diagnosis not present

## 2014-07-09 DIAGNOSIS — I4891 Unspecified atrial fibrillation: Secondary | ICD-10-CM | POA: Diagnosis not present

## 2014-07-09 DIAGNOSIS — M25519 Pain in unspecified shoulder: Secondary | ICD-10-CM | POA: Diagnosis not present

## 2014-07-09 DIAGNOSIS — K259 Gastric ulcer, unspecified as acute or chronic, without hemorrhage or perforation: Secondary | ICD-10-CM | POA: Diagnosis not present

## 2014-07-09 DIAGNOSIS — D126 Benign neoplasm of colon, unspecified: Secondary | ICD-10-CM | POA: Diagnosis not present

## 2014-07-09 DIAGNOSIS — I10 Essential (primary) hypertension: Secondary | ICD-10-CM | POA: Diagnosis not present

## 2014-07-09 DIAGNOSIS — E785 Hyperlipidemia, unspecified: Secondary | ICD-10-CM | POA: Diagnosis not present

## 2014-07-09 DIAGNOSIS — G8911 Acute pain due to trauma: Secondary | ICD-10-CM | POA: Diagnosis not present

## 2014-07-09 LAB — POCT INR: INR: 6.6

## 2014-07-09 NOTE — Progress Notes (Signed)
Anti-Coagulation Progress Note  Daniel [name] is a 73 y.o. male who is currently on an anti-coagulation regimen.    RECENT RESULTS: Recent results are below, the most recent result is correlated with a dose of 2.5mg  alternating with 5mg  qod. Apparently still seeing the effect (long half-life) of the amiodarone which was discontinued--but the patient continued taking until first visit with me--at which time we collected the bottle of amiodarone so he could no longer take. Will OMIT DOSES x 2 days (today and tomorrow) and repeat INR on Wednesday 23-JUL-15. Patient was similarly advised that he could eat something "dark-Knotek and leafy" tonight/tomorrow.  Lab Results  Component Value Date   INR 6.60 07/09/2014   INR 4.0 07/02/2014   INR 1.34 07/28/2012    ANTI-COAG DOSE: Anticoagulation Dose Instructions as of 07/09/2014     [name] Tue Wed Thu Fri Sat   New Dose 0 mg 0 mg 0 mg 0 mg 0 mg 0 mg 0 mg       ANTICOAG SUMMARY: Anticoagulation Episode Summary   Current INR goal 2.0-3.0  Next INR check 07/11/2014  INR from last check 6.60! (07/09/2014)  Weekly max dose   Target end date   INR check location   Preferred lab   Send INR reminders to    Indications  Atrial fibrillation status post cardioversion [427.31] Encounter for long-term (current) use of antiplatelets/antithrombotics [V58.63]        Comments         ANTICOAG TODAY: Anticoagulation Summary as of 07/09/2014   INR goal 2.0-3.0  Selected INR 6.60! (07/09/2014)  Next INR check 07/11/2014  Target end date    Indications  Atrial fibrillation status post cardioversion [427.31] Encounter for long-term (current) use of antiplatelets/antithrombotics [V58.63]      Anticoagulation Episode Summary   INR check location    Preferred lab    Send INR reminders to    Comments       PATIENT INSTRUCTIONS: Patient Instructions  Patient instructed to take medications as defined in the Anti-coagulation Track section of this  encounter.  Patient instructed to OMIT/HOLD--DO NOT TAKE  today's dose--OR tomorrow's dose. Return to outpatient clinic on Wednesday July 12, 2014 at 1100AM for repeat INR. Patient verbalized understanding of these instructions.       FOLLOW-UP Return in 2 days (on 07/11/2014) for Follow up INR by LAB. Have them call results to me. Jorene Guest, III Pharm.D., CACP

## 2014-07-09 NOTE — Patient Instructions (Signed)
Patient instructed to take medications as defined in the Anti-coagulation Track section of this encounter.  Patient instructed to OMIT/HOLD--DO NOT TAKE  today's dose--OR tomorrow's dose. Return to outpatient clinic on Wednesday July 12, 2014 at 1100AM for repeat INR. Patient verbalized understanding of these instructions.

## 2014-07-11 ENCOUNTER — Ambulatory Visit (INDEPENDENT_AMBULATORY_CARE_PROVIDER_SITE_OTHER): Payer: Medicare Other | Admitting: Pharmacist

## 2014-07-11 DIAGNOSIS — I4891 Unspecified atrial fibrillation: Secondary | ICD-10-CM | POA: Diagnosis not present

## 2014-07-11 DIAGNOSIS — Z7902 Long term (current) use of antithrombotics/antiplatelets: Secondary | ICD-10-CM

## 2014-07-11 DIAGNOSIS — M25519 Pain in unspecified shoulder: Secondary | ICD-10-CM | POA: Diagnosis not present

## 2014-07-11 LAB — POCT INR: INR: 5.7

## 2014-07-11 NOTE — Patient Instructions (Signed)
Patient instructed to take medications as defined in the Anti-coagulation Track section of this encounter.  Patient instructed to OMIT/HOLD today's dose.  Patient verbalized understanding of these instructions.    

## 2014-07-11 NOTE — Progress Notes (Signed)
Anti-Coagulation Progress Note  Daniel [name] is a 73 y.o. male who is currently on an anti-coagulation regimen.    RECENT RESULTS: Recent results are below, the most recent result is correlated with a dose of ZEROmg since last INR. He will go out on CONTINUED HOLD. We are seeing the residual effects of amiodarone which is noted to have a very long half-life--and, recall the patient actually continued taking the amiodarone beyond the date he had been told to stop taking it. He reports no bleeding. We will continue to HOLD/OMIT doses for today and tomorrow and will re-check INR in [facility] on Friday 24-JUL-15. Lab Results  Component Value Date   INR 5.7 07/11/2014   INR 6.60 07/09/2014   INR 4.0 07/02/2014    ANTI-COAG DOSE: Anticoagulation Dose Instructions as of 07/11/2014     [name] Tue Wed Thu Fri Sat   New Dose 0 mg 0 mg 0 mg 0 mg 0 mg 0 mg 0 mg       ANTICOAG SUMMARY: Anticoagulation Episode Summary   Current INR goal 2.0-3.0  Next INR check 07/13/2014  INR from last check 5.7! (07/11/2014)  Weekly max dose   Target end date   INR check location   Preferred lab   Send INR reminders to    Indications  Atrial fibrillation status post cardioversion [427.31] Encounter for long-term (current) use of antiplatelets/antithrombotics [V58.63]        Comments         ANTICOAG TODAY: Anticoagulation Summary as of 07/11/2014   INR goal 2.0-3.0  Selected INR 5.7! (07/11/2014)  Next INR check 07/13/2014  Target end date    Indications  Atrial fibrillation status post cardioversion [427.31] Encounter for long-term (current) use of antiplatelets/antithrombotics [V58.63]      Anticoagulation Episode Summary   INR check location    Preferred lab    Send INR reminders to    Comments       PATIENT INSTRUCTIONS: Patient Instructions  Patient instructed to take medications as defined in the Anti-coagulation Track section of this encounter.  Patient instructed to OMIT/HOLD today's  dose.  Patient verbalized understanding of these instructions.       FOLLOW-UP Return in 2 days (on 07/13/2014) for Follow up INR at 1000h.  Jorene Guest, III Pharm.D., CACP

## 2014-07-12 NOTE — Progress Notes (Signed)
INTERNAL MEDICINE TEACHING ATTENDING ADDENDUM - Aldine Contes M.D  Duration- indefinte, Indication- afib, INR- supratherapeutic. Agree with Dr. Gladstone Pih recommendations as outlined in his note. Patient to follow up in AM for repeat INR

## 2014-07-13 ENCOUNTER — Ambulatory Visit (INDEPENDENT_AMBULATORY_CARE_PROVIDER_SITE_OTHER): Payer: Medicare Other | Admitting: Pharmacist

## 2014-07-13 DIAGNOSIS — I4891 Unspecified atrial fibrillation: Secondary | ICD-10-CM

## 2014-07-13 DIAGNOSIS — M25519 Pain in unspecified shoulder: Secondary | ICD-10-CM | POA: Diagnosis not present

## 2014-07-13 DIAGNOSIS — Z7902 Long term (current) use of antithrombotics/antiplatelets: Secondary | ICD-10-CM

## 2014-07-13 LAB — POCT INR: INR: 2.8

## 2014-07-13 NOTE — Patient Instructions (Signed)
Patient instructed to take medications as defined in the Anti-coagulation Track section of this encounter.  Patient instructed to take today's dose.  Patient verbalized understanding of these instructions.    

## 2014-07-13 NOTE — Progress Notes (Signed)
Anti-Coagulation Progress Note  Daniel [name] is a 73 y.o. male who is currently on an anti-coagulation regimen.    RECENT RESULTS: Recent results are below, the most recent result is correlated with a dose of ZEROmg--warfarin has been on hold all of the week after marked hypoprothrombinemic response secondary to drug-drug interaction with warfarin and amiodarone (which has been discontinued)---but due to long half-life of amiodarone, we continued to see this response. Appears that with today's INR value 2.8 we are now seeing clearance of warfarin and hopefully clearance of amiodarone as well. He was encouraged to eat increased intake of vitamin K containing foods this week which he has done. Have started his dose back cautiously at 2.5mg  warfarin each day (Friday, Saturday, Sunday) and he will RTC on Monday for repeat INR. He has been provided my phone number in the event that he has any questions, concerns over the weekend. He has been reminded--as all patients in our clinic on warfarin:  Should there be bleeding ANYWHERE (urine, stool, nose-bleeds, throwing up blood, coughing up blood, increased bruising etc.--that he may call me or if he deems it an emergency--to report to the ED at [facility]). He verbalized his understanding of these instructions.  Lab Results  Component Value Date   INR 2.8 07/13/2014   INR 5.7 07/11/2014   INR 6.60 07/09/2014    ANTI-COAG DOSE: Anticoagulation Dose Instructions as of 07/13/2014     [name] Tue Wed Thu Fri Sat   New Dose 2.5 mg 0 mg 0 mg 0 mg 0 mg 2.5 mg 2.5 mg       ANTICOAG SUMMARY: Anticoagulation Episode Summary   Current INR goal 2.0-3.0  Next INR check 07/16/2014  INR from last check 2.8 (07/13/2014)  Weekly max dose   Target end date   INR check location   Preferred lab   Send INR reminders to    Indications  Atrial fibrillation status post cardioversion [427.31] Encounter for long-term (current) use of antiplatelets/antithrombotics [V58.63]         Comments         ANTICOAG TODAY: Anticoagulation Summary as of 07/13/2014   INR goal 2.0-3.0  Selected INR 2.8 (07/13/2014)  Next INR check 07/16/2014  Target end date    Indications  Atrial fibrillation status post cardioversion [427.31] Encounter for long-term (current) use of antiplatelets/antithrombotics [V58.63]      Anticoagulation Episode Summary   INR check location    Preferred lab    Send INR reminders to    Comments       PATIENT INSTRUCTIONS: Patient Instructions  Patient instructed to take medications as defined in the Anti-coagulation Track section of this encounter.  Patient instructed to take today's dose.  Patient verbalized understanding of these instructions.       FOLLOW-UP Return in 3 days (on 07/16/2014) for Follow up INR at 1000h.  Jorene Guest, III Pharm.D., CACP

## 2014-07-16 ENCOUNTER — Ambulatory Visit (INDEPENDENT_AMBULATORY_CARE_PROVIDER_SITE_OTHER): Payer: Medicare Other | Admitting: Pharmacist

## 2014-07-16 DIAGNOSIS — I4891 Unspecified atrial fibrillation: Secondary | ICD-10-CM | POA: Diagnosis not present

## 2014-07-16 DIAGNOSIS — Z7902 Long term (current) use of antithrombotics/antiplatelets: Secondary | ICD-10-CM

## 2014-07-16 LAB — POCT INR: INR: 2.9

## 2014-07-16 NOTE — Progress Notes (Signed)
INTERNAL MEDICINE TEACHING ATTENDING ADDENDUM - Sydne Krahl M.D  Duration- indefinite, Indication- afib, INR- therapeutic. Agree with Dr. Groce's recommendations as outlined in his note.      

## 2014-07-16 NOTE — Patient Instructions (Signed)
Patient instructed to take medications as defined in the Anti-coagulation Track section of this encounter.  Patient instructed to OMIT doses on Mondays/Wednesdays/Fridays  Patient verbalized understanding of these instructions.

## 2014-07-16 NOTE — Progress Notes (Signed)
Anti-Coagulation Progress Note  Daniel [name] is a 73 y.o. male who is currently on an anti-coagulation regimen.    RECENT RESULTS: Recent results are below, the most recent result is correlated with a dose of 2.5mg  warfarin since last visit (Friday of last week). 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 Will adjust to total of 10mg  warfarin per week--using an every other day 2.5mg  with 0mg  warfarin regimen. Lab Results  Component Value Date   INR 2.90 07/16/2014   INR 2.8 07/13/2014   INR 5.7 07/11/2014    ANTI-COAG DOSE: Anticoagulation Dose Instructions as of 07/16/2014     [name] Tue Wed Thu Fri Sat   New Dose 2.5 mg 0 mg 2.5 mg 0 mg 2.5 mg 0 mg 2.5 mg       ANTICOAG SUMMARY: Anticoagulation Episode Summary   Current INR goal 2.0-3.0  Next INR check 07/30/2014  INR from last check 2.90 (07/16/2014)  Weekly max dose   Target end date   INR check location   Preferred lab   Send INR reminders to    Indications  Atrial fibrillation status post cardioversion [427.31] Encounter for long-term (current) use of antiplatelets/antithrombotics [V58.63]        Comments         ANTICOAG TODAY: Anticoagulation Summary as of 07/16/2014   INR goal 2.0-3.0  Selected INR 2.90 (07/16/2014)  Next INR check 07/30/2014  Target end date    Indications  Atrial fibrillation status post cardioversion [427.31] Encounter for long-term (current) use of antiplatelets/antithrombotics [V58.63]      Anticoagulation Episode Summary   INR check location    Preferred lab    Send INR reminders to    Comments       PATIENT INSTRUCTIONS: Patient Instructions  Patient instructed to take medications as defined in the Anti-coagulation Track section of this encounter.  Patient instructed to OMIT doses on Mondays/Wednesdays/Fridays  Patient verbalized understanding of these instructions.       FOLLOW-UP Return in 2 weeks (on  07/30/2014) for Follow up INR at 1030h.  Jorene Guest, III Pharm.D., CACP

## 2014-07-30 ENCOUNTER — Ambulatory Visit (INDEPENDENT_AMBULATORY_CARE_PROVIDER_SITE_OTHER): Payer: Medicare Other | Admitting: Pharmacist

## 2014-07-30 DIAGNOSIS — G8911 Acute pain due to trauma: Secondary | ICD-10-CM | POA: Diagnosis not present

## 2014-07-30 DIAGNOSIS — I4891 Unspecified atrial fibrillation: Secondary | ICD-10-CM | POA: Diagnosis not present

## 2014-07-30 DIAGNOSIS — M25519 Pain in unspecified shoulder: Secondary | ICD-10-CM | POA: Diagnosis not present

## 2014-07-30 DIAGNOSIS — E785 Hyperlipidemia, unspecified: Secondary | ICD-10-CM | POA: Diagnosis not present

## 2014-07-30 DIAGNOSIS — Z7902 Long term (current) use of antithrombotics/antiplatelets: Secondary | ICD-10-CM

## 2014-07-30 DIAGNOSIS — F17200 Nicotine dependence, unspecified, uncomplicated: Secondary | ICD-10-CM | POA: Diagnosis not present

## 2014-07-30 DIAGNOSIS — Z7901 Long term (current) use of anticoagulants: Secondary | ICD-10-CM | POA: Diagnosis not present

## 2014-07-30 DIAGNOSIS — I10 Essential (primary) hypertension: Secondary | ICD-10-CM | POA: Diagnosis not present

## 2014-07-30 DIAGNOSIS — K259 Gastric ulcer, unspecified as acute or chronic, without hemorrhage or perforation: Secondary | ICD-10-CM | POA: Diagnosis not present

## 2014-07-30 DIAGNOSIS — I739 Peripheral vascular disease, unspecified: Secondary | ICD-10-CM | POA: Diagnosis not present

## 2014-07-30 DIAGNOSIS — D126 Benign neoplasm of colon, unspecified: Secondary | ICD-10-CM | POA: Diagnosis not present

## 2014-07-30 LAB — POCT INR: INR: 2.1

## 2014-07-30 NOTE — Progress Notes (Signed)
Anti-Coagulation Progress Note  Elber Galyean is a 73 y.o. male who is currently on an anti-coagulation regimen.    RECENT RESULTS: Recent results are below, the most recent result is correlated with a dose of 10 mg. per week: Lab Results  Component Value Date   INR 2.10 07/30/2014   INR 2.90 07/16/2014   INR 2.8 07/13/2014    ANTI-COAG DOSE: Anticoagulation Dose Instructions as of 07/30/2014     [name] Tue Wed Thu Fri Sat   New Dose 2.5 mg 2.5 mg 2.5 mg 0 mg 2.5 mg 2.5 mg 2.5 mg       ANTICOAG SUMMARY: Anticoagulation Episode Summary   Current INR goal 2.0-3.0  Next INR check 08/20/2014  INR from last check 2.10 (07/30/2014)  Weekly max dose   Target end date   INR check location   Preferred lab   Send INR reminders to    Indications  Atrial fibrillation status post cardioversion [427.31] Encounter for long-term (current) use of antiplatelets/antithrombotics [V58.63]        Comments         ANTICOAG TODAY: Anticoagulation Summary as of 07/30/2014   INR goal 2.0-3.0  Selected INR 2.10 (07/30/2014)  Next INR check 08/20/2014  Target end date    Indications  Atrial fibrillation status post cardioversion [427.31] Encounter for long-term (current) use of antiplatelets/antithrombotics [V58.63]      Anticoagulation Episode Summary   INR check location    Preferred lab    Send INR reminders to    Comments       PATIENT INSTRUCTIONS: Patient Instructions  Patient instructed to take medications as defined in the Anti-coagulation Track section of this encounter.  Patient instructed to take today's dose.  Patient verbalized understanding of these instructions.       FOLLOW-UP Return in 3 weeks (on 08/20/2014) for Follow up INR at 1030h.  Jorene Guest, III Pharm.D., CACP

## 2014-07-30 NOTE — Patient Instructions (Signed)
Patient instructed to take medications as defined in the Anti-coagulation Track section of this encounter.  Patient instructed to take today's dose.  Patient verbalized understanding of these instructions.    

## 2014-07-31 ENCOUNTER — Encounter: Payer: Self-pay | Admitting: Internal Medicine

## 2014-08-20 ENCOUNTER — Ambulatory Visit (INDEPENDENT_AMBULATORY_CARE_PROVIDER_SITE_OTHER): Payer: Medicare Other | Admitting: Pharmacist

## 2014-08-20 DIAGNOSIS — F17200 Nicotine dependence, unspecified, uncomplicated: Secondary | ICD-10-CM | POA: Diagnosis not present

## 2014-08-20 DIAGNOSIS — Z7902 Long term (current) use of antithrombotics/antiplatelets: Secondary | ICD-10-CM

## 2014-08-20 DIAGNOSIS — D126 Benign neoplasm of colon, unspecified: Secondary | ICD-10-CM | POA: Diagnosis not present

## 2014-08-20 DIAGNOSIS — I4891 Unspecified atrial fibrillation: Secondary | ICD-10-CM | POA: Diagnosis not present

## 2014-08-20 DIAGNOSIS — E785 Hyperlipidemia, unspecified: Secondary | ICD-10-CM | POA: Diagnosis not present

## 2014-08-20 DIAGNOSIS — G8911 Acute pain due to trauma: Secondary | ICD-10-CM | POA: Diagnosis not present

## 2014-08-20 DIAGNOSIS — K259 Gastric ulcer, unspecified as acute or chronic, without hemorrhage or perforation: Secondary | ICD-10-CM | POA: Diagnosis not present

## 2014-08-20 DIAGNOSIS — I739 Peripheral vascular disease, unspecified: Secondary | ICD-10-CM | POA: Diagnosis not present

## 2014-08-20 DIAGNOSIS — M25519 Pain in unspecified shoulder: Secondary | ICD-10-CM | POA: Diagnosis not present

## 2014-08-20 DIAGNOSIS — I10 Essential (primary) hypertension: Secondary | ICD-10-CM | POA: Diagnosis not present

## 2014-08-20 LAB — POCT INR: INR: 3

## 2014-08-20 NOTE — Progress Notes (Signed)
Anti-Coagulation Progress Note  Daniel [name] is a 73 y.o. male who is currently on an anti-coagulation regimen.    RECENT RESULTS: Recent results are below, the most recent result is correlated with a dose of 15 mg. per week: Lab Results  Component Value Date   INR 3.0 08/20/2014   INR 2.10 07/30/2014   INR 2.90 07/16/2014    ANTI-COAG DOSE: Anticoagulation Dose Instructions as of 08/20/2014     [name] Tue Wed Thu Fri Sat   New Dose 2.5 mg 0 mg 2.5 mg 2.5 mg 0 mg 2.5 mg 2.5 mg       ANTICOAG SUMMARY: Anticoagulation Episode Summary   Current INR goal 2.0-3.0  Next INR check 09/17/2014  INR from last check 3.0 (08/20/2014)  Weekly max dose   Target end date   INR check location   Preferred lab   Send INR reminders to    Indications  Atrial fibrillation status post cardioversion [427.31] Encounter for long-term (current) use of antiplatelets/antithrombotics [V58.63]        Comments         ANTICOAG TODAY: Anticoagulation Summary as of 08/20/2014   INR goal 2.0-3.0  Selected INR 3.0 (08/20/2014)  Next INR check 09/17/2014  Target end date    Indications  Atrial fibrillation status post cardioversion [427.31] Encounter for long-term (current) use of antiplatelets/antithrombotics [V58.63]      Anticoagulation Episode Summary   INR check location    Preferred lab    Send INR reminders to    Comments       PATIENT INSTRUCTIONS: Patient Instructions  Patient instructed to take medications as defined in the Anti-coagulation Track section of this encounter.  Patient instructed to OMIT today's dose. OMIT Thursday's dose. OMIT doses on every Monday/Thursday. Patient verbalized understanding of these instructions.       FOLLOW-UP Return in 4 weeks (on 09/17/2014) for Follow up INR at 1030h.  Jorene Guest, III Pharm.D., CACP

## 2014-08-20 NOTE — Patient Instructions (Signed)
Patient instructed to take medications as defined in the Anti-coagulation Track section of this encounter.  Patient instructed to OMIT today's dose. OMIT Thursday's dose. OMIT doses on every Monday/Thursday. Patient verbalized understanding of these instructions.

## 2014-08-21 NOTE — Progress Notes (Signed)
Patient is on coumadin for Afib.  INR 3 today and coumadin was decreased.  I have reviewed Dr. Gladstone Pih note.

## 2014-09-01 ENCOUNTER — Other Ambulatory Visit: Payer: Self-pay | Admitting: Cardiology

## 2014-09-04 ENCOUNTER — Other Ambulatory Visit: Payer: Self-pay | Admitting: Internal Medicine

## 2014-09-04 DIAGNOSIS — I4891 Unspecified atrial fibrillation: Secondary | ICD-10-CM

## 2014-09-17 ENCOUNTER — Ambulatory Visit (INDEPENDENT_AMBULATORY_CARE_PROVIDER_SITE_OTHER): Payer: Medicare Other | Admitting: Pharmacist

## 2014-09-17 DIAGNOSIS — Z7902 Long term (current) use of antithrombotics/antiplatelets: Secondary | ICD-10-CM

## 2014-09-17 DIAGNOSIS — I4891 Unspecified atrial fibrillation: Secondary | ICD-10-CM

## 2014-09-17 LAB — POCT INR: INR: 2.3

## 2014-09-17 NOTE — Progress Notes (Signed)
Anti-Coagulation Progress Note  Daniel [name] is a 73 y.o. male who is currently on an anti-coagulation regimen.    RECENT RESULTS: Recent results are below, the most recent result is correlated with a dose of 12.5 mg. per week: Lab Results  Component Value Date   INR 2.30 09/17/2014   INR 3.0 08/20/2014   INR 2.10 07/30/2014    ANTI-COAG DOSE: Anticoagulation Dose Instructions as of 09/17/2014     [name] Tue Wed Thu Fri Sat   New Dose 2.5 mg 0 mg 2.5 mg 2.5 mg 0 mg 2.5 mg 2.5 mg       ANTICOAG SUMMARY: Anticoagulation Episode Summary   Current INR goal 2.0-3.0  Next INR check 10/15/2014  INR from last check 2.30 (09/17/2014)  Weekly max dose   Target end date   INR check location   Preferred lab   Send INR reminders to    Indications  Atrial fibrillation status post cardioversion [427.31] Encounter for long-term (current) use of antiplatelets/antithrombotics [V58.63]        Comments         ANTICOAG TODAY: Anticoagulation Summary as of 09/17/2014   INR goal 2.0-3.0  Selected INR 2.30 (09/17/2014)  Next INR check 10/15/2014  Target end date    Indications  Atrial fibrillation status post cardioversion [427.31] Encounter for long-term (current) use of antiplatelets/antithrombotics [V58.63]      Anticoagulation Episode Summary   INR check location    Preferred lab    Send INR reminders to    Comments       PATIENT INSTRUCTIONS: Patient Instructions  Patient instructed to take medications as defined in the Anti-coagulation Track section of this encounter.  Patient instructed to OMIT Mondays and Thursdays doses each week.   Patient verbalized understanding of these instructions.       FOLLOW-UP Return in 4 weeks (on 10/15/2014) for Follow up INR at 1030h.  Jorene Guest, III Pharm.D., CACP

## 2014-09-17 NOTE — Patient Instructions (Signed)
Patient instructed to take medications as defined in the Anti-coagulation Track section of this encounter.  Patient instructed to OMIT Mondays and Thursdays doses each week.   Patient verbalized understanding of these instructions.

## 2014-09-19 NOTE — Progress Notes (Signed)
Indication: Atrial fibrillation. Duration: Lifelong. INR: At target. Agree with Dr. Gladstone Pih assessment and plan.

## 2014-10-02 ENCOUNTER — Other Ambulatory Visit: Payer: Self-pay | Admitting: Cardiology

## 2014-10-15 ENCOUNTER — Ambulatory Visit (INDEPENDENT_AMBULATORY_CARE_PROVIDER_SITE_OTHER): Payer: Medicare Other | Admitting: Pharmacist

## 2014-10-15 DIAGNOSIS — Z7902 Long term (current) use of antithrombotics/antiplatelets: Secondary | ICD-10-CM | POA: Diagnosis present

## 2014-10-15 DIAGNOSIS — I4891 Unspecified atrial fibrillation: Secondary | ICD-10-CM | POA: Diagnosis not present

## 2014-10-15 LAB — POCT INR: INR: 3

## 2014-10-15 NOTE — Progress Notes (Signed)
Indication: Atrial fibrillation. Duration: Lifelong. INR: At target. Agree with Dr. Kristine Royal assessment and plan.

## 2014-10-15 NOTE — Progress Notes (Signed)
Anti-Coagulation Progress Note  Daniel [name] is a 73 y.o. male who is currently on an anti-coagulation regimen.    RECENT RESULTS: Recent results are below, the most recent result is correlated with a dose of 12.5 mg. per week: Lab Results  Component Value Date   INR 3.00 10/15/2014   INR 2.30 09/17/2014   INR 3.0 08/20/2014    ANTI-COAG DOSE: Anticoagulation Dose Instructions as of 10/15/2014     [name] Tue Wed Thu Fri Sat   New Dose 2.5 mg 0 mg 2.5 mg 0 mg 2.5 mg 0 mg 2.5 mg       ANTICOAG SUMMARY: Anticoagulation Episode Summary   Current INR goal 2.0-3.0  Next INR check 11/05/2014  INR from last check 3.00 (10/15/2014)  Weekly max dose   Target end date   INR check location   Preferred lab   Send INR reminders to    Indications  Atrial fibrillation status post cardioversion [I48.91] Encounter for long-term (current) use of antiplatelets/antithrombotics [Z79.02]        Comments         ANTICOAG TODAY: Anticoagulation Summary as of 10/15/2014   INR goal 2.0-3.0  Selected INR 3.00 (10/15/2014)  Next INR check 11/05/2014  Target end date    Indications  Atrial fibrillation status post cardioversion [I48.91] Encounter for long-term (current) use of antiplatelets/antithrombotics [Z79.02]      Anticoagulation Episode Summary   INR check location    Preferred lab    Send INR reminders to    Comments       PATIENT INSTRUCTIONS: Patient Instructions  Patient instructed to take medications as defined in the Anti-coagulation Track section of this encounter.  Patient instructed to take today's dose.  Patient verbalized understanding of these instructions.       FOLLOW-UP Return in 3 weeks (on 11/05/2014) for Follow-up INR at 1030.   Theron Arista, PharmD Clinical Pharmacist - Resident Pager: [phone] 10/26/201511:50 AM

## 2014-10-15 NOTE — Patient Instructions (Signed)
Patient instructed to take medications as defined in the Anti-coagulation Track section of this encounter.  Patient instructed to take today's dose.  Patient verbalized understanding of these instructions.    

## 2014-10-23 ENCOUNTER — Ambulatory Visit (INDEPENDENT_AMBULATORY_CARE_PROVIDER_SITE_OTHER): Payer: Medicare Other | Admitting: Nurse Practitioner

## 2014-10-23 ENCOUNTER — Encounter: Payer: Self-pay | Admitting: Nurse Practitioner

## 2014-10-23 VITALS — BP 150/80 | HR 48 | Ht 69.0 in | Wt 103.8 lb

## 2014-10-23 DIAGNOSIS — R634 Abnormal weight loss: Secondary | ICD-10-CM | POA: Diagnosis not present

## 2014-10-23 DIAGNOSIS — E785 Hyperlipidemia, unspecified: Secondary | ICD-10-CM | POA: Diagnosis not present

## 2014-10-23 DIAGNOSIS — I10 Essential (primary) hypertension: Secondary | ICD-10-CM

## 2014-10-23 DIAGNOSIS — I2510 Atherosclerotic heart disease of native coronary artery without angina pectoris: Secondary | ICD-10-CM

## 2014-10-23 DIAGNOSIS — I480 Paroxysmal atrial fibrillation: Secondary | ICD-10-CM | POA: Diagnosis not present

## 2014-10-23 DIAGNOSIS — I428 Other cardiomyopathies: Secondary | ICD-10-CM

## 2014-10-23 DIAGNOSIS — Z79899 Other long term (current) drug therapy: Secondary | ICD-10-CM

## 2014-10-23 DIAGNOSIS — I429 Cardiomyopathy, unspecified: Secondary | ICD-10-CM | POA: Diagnosis not present

## 2014-10-23 LAB — CBC
HCT: 42.2 % (ref 39.0–52.0)
Hemoglobin: 13.5 g/dL (ref 13.0–17.0)
MCHC: 31.9 g/dL (ref 30.0–36.0)
MCV: 99.8 fl (ref 78.0–100.0)
Platelets: 214 10*3/uL (ref 150.0–400.0)
RBC: 4.23 Mil/uL (ref 4.22–5.81)
RDW: 14.9 % (ref 11.5–15.5)
WBC: 6.6 10*3/uL (ref 4.0–10.5)

## 2014-10-23 LAB — BASIC METABOLIC PANEL
BUN: 12 mg/dL (ref 6–23)
CO2: 26 mEq/L (ref 19–32)
Calcium: 10.3 mg/dL (ref 8.4–10.5)
Chloride: 107 mEq/L (ref 96–112)
Creatinine, Ser: 1.4 mg/dL (ref 0.4–1.5)
GFR: 61.8 mL/min (ref >60.00)
Glucose, Bld: 80 mg/dL (ref 70–99)
Potassium: 4.7 mEq/L (ref 3.5–5.1)
Sodium: 139 mEq/L (ref 135–145)

## 2014-10-23 LAB — LIPID PANEL
Cholesterol: 121 mg/dL (ref 0–200)
HDL: 46.9 mg/dL (ref >39.00)
LDL Cholesterol: 65 mg/dL (ref 0–99)
NonHDL: 74.1
Total CHOL/HDL Ratio: 3
Triglycerides: 48 mg/dL (ref 0.0–149.0)
VLDL: 9.6 mg/dL (ref 0.0–40.0)

## 2014-10-23 LAB — HEPATIC FUNCTION PANEL
ALT: 20 U/L (ref 0–53)
AST: 34 U/L (ref 0–37)
Albumin: 3.5 g/dL (ref 3.5–5.2)
Alkaline Phosphatase: 85 U/L (ref 39–117)
Bilirubin, Direct: 0.1 mg/dL (ref 0.0–0.3)
Total Bilirubin: 0.5 mg/dL (ref 0.2–1.2)
Total Protein: 7.3 g/dL (ref 6.0–8.3)

## 2014-10-23 MED ORDER — DRONEDARONE HCL 400 MG PO TABS: 400.0000 mg | ORAL_TABLET | Freq: Two times a day (BID) | ORAL | Status: DC

## 2014-10-23 MED ORDER — ATORVASTATIN CALCIUM 20 MG PO TABS: 20.0000 mg | ORAL_TABLET | Freq: Every day | ORAL | Status: DC

## 2014-10-23 MED ORDER — METOPROLOL SUCCINATE ER 50 MG PO TB24: ORAL_TABLET | ORAL | Status: DC

## 2014-10-23 MED ORDER — ENALAPRIL MALEATE 10 MG PO TABS: 10.0000 mg | ORAL_TABLET | Freq: Two times a day (BID) | ORAL | Status: DC
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
## 2014-10-23 NOTE — Patient Instructions (Signed)
We will be checking the following labs today BMET, CBC, HPF, Lipids, TSH  I have refilled your medicines today  See Dr. Aundra Dubin in 6 months  Call the [place] office at [phone] if you have any questions, problems or concerns.

## 2014-10-24 ENCOUNTER — Encounter: Payer: Self-pay | Admitting: *Deleted

## 2014-10-24 LAB — TSH: TSH: 1.31 u[IU]/mL (ref 0.35–4.50)

## 2014-10-24 NOTE — Progress Notes (Unsigned)
Received fax from [place] stating pt is having a dental extraction and he is on coumadin.  They want to know if he needs to stop coumadin before extraction.  I talked with Dr Eppie Gibson and coumadin does not need to be stopped.  Vickie at [facility] informed.   She states pt is only having one extraction. Patient also informed.

## 2014-10-25 NOTE — Progress Notes (Signed)
For a single tooth extraction, stopping anticoagulation is not considered necessary.  Would advise against stopping anticoagulation and applying local packing after the extraction.

## 2014-11-05 ENCOUNTER — Ambulatory Visit (INDEPENDENT_AMBULATORY_CARE_PROVIDER_SITE_OTHER): Payer: Medicare Other | Admitting: Pharmacist

## 2014-11-05 DIAGNOSIS — I4891 Unspecified atrial fibrillation: Secondary | ICD-10-CM | POA: Diagnosis not present

## 2014-11-05 DIAGNOSIS — Z7902 Long term (current) use of antithrombotics/antiplatelets: Secondary | ICD-10-CM

## 2014-11-05 LAB — POCT INR: INR: 1.7

## 2014-11-05 NOTE — Progress Notes (Signed)
Anti-Coagulation Progress Note  Daniel [name] is a 73 y.o. male who is currently on an anti-coagulation regimen.    RECENT RESULTS: Recent results are below, the most recent result is correlated with a dose of 10 mg. per week: Lab Results  Component Value Date   INR 1.70 11/05/2014   INR 3.00 10/15/2014   INR 2.30 09/17/2014    ANTI-COAG DOSE: Anticoagulation Dose Instructions as of 11/05/2014      [name] Tue Wed Thu Fri Sat   New Dose 2.5 mg 2.5 mg 2.5 mg 0 mg 2.5 mg 2.5 mg 2.5 mg       ANTICOAG SUMMARY: Anticoagulation Episode Summary    Current INR goal 2.0-3.0  Next INR check 11/19/2014  INR from last check 1.70! (11/05/2014)  Weekly max dose   Target end date   INR check location   Preferred lab   Send INR reminders to    Indications  Atrial fibrillation status post cardioversion [I48.91] Encounter for long-term (current) use of antiplatelets/antithrombotics [Z79.02]        Comments         ANTICOAG TODAY: Anticoagulation Summary as of 11/05/2014    INR goal 2.0-3.0  Selected INR 1.70! (11/05/2014)  Next INR check 11/19/2014  Target end date    Indications  Atrial fibrillation status post cardioversion [I48.91] Encounter for long-term (current) use of antiplatelets/antithrombotics [Z79.02]      Anticoagulation Episode Summary    INR check location    Preferred lab    Send INR reminders to    Comments       PATIENT INSTRUCTIONS: Patient Instructions  Patient instructed to take medications as defined in the Anti-coagulation Track section of this encounter.  Patient instructed to take today's dose.  Patient verbalized understanding of these instructions.       FOLLOW-UP Return in 2 weeks (on 11/19/2014) for Follow up INR at 1030h.  Jorene Guest, III Pharm.D., CACP

## 2014-11-05 NOTE — Patient Instructions (Signed)
Patient instructed to take medications as defined in the Anti-coagulation Track section of this encounter.  Patient instructed to take today's dose.  Patient verbalized understanding of these instructions.    

## 2014-11-05 NOTE — Progress Notes (Signed)
Indication: Paroxysmal atrial fibrillation. Duration: Lifelong. INR: Below target. Agree with Dr. Gladstone Pih assessment and plan.

## 2014-11-19 ENCOUNTER — Ambulatory Visit (INDEPENDENT_AMBULATORY_CARE_PROVIDER_SITE_OTHER): Payer: Medicare Other | Admitting: Pharmacist

## 2014-11-19 DIAGNOSIS — Z7901 Long term (current) use of anticoagulants: Secondary | ICD-10-CM | POA: Diagnosis not present

## 2014-11-19 DIAGNOSIS — Z7902 Long term (current) use of antithrombotics/antiplatelets: Secondary | ICD-10-CM

## 2014-11-19 DIAGNOSIS — I4891 Unspecified atrial fibrillation: Secondary | ICD-10-CM

## 2014-11-19 LAB — POCT INR: INR: 3

## 2014-11-19 NOTE — Patient Instructions (Signed)
Patient instructed to take medications as defined in the Anti-coagulation Track section of this encounter.  Patient instructed to OMIT today's dose on ALL MONDAYS and WEDNESDAYS--do NOT take warfarin on those 2 days of the week.  Patient verbalized understanding of these instructions.

## 2014-11-19 NOTE — Progress Notes (Signed)
Anti-Coagulation Progress Note  Daniel [name] is a 73 y.o. male who is currently on an anti-coagulation regimen.    RECENT RESULTS: Recent results are below, the most recent result is correlated with a dose of 15 mg. per week: Lab Results  Component Value Date   INR 3.00 11/19/2014   INR 1.70 11/05/2014   INR 3.00 10/15/2014    ANTI-COAG DOSE: Anticoagulation Dose Instructions as of 11/19/2014      [name] Tue Wed Thu Fri Sat   New Dose 2.5 mg 0 mg 2.5 mg 0 mg 2.5 mg 2.5 mg 2.5 mg       ANTICOAG SUMMARY: Anticoagulation Episode Summary    Current INR goal 2.0-3.0  Next INR check 12/17/2014  INR from last check 3.00 (11/19/2014)  Weekly max dose   Target end date   INR check location   Preferred lab   Send INR reminders to    Indications  Atrial fibrillation status post cardioversion [I48.91] Encounter for long-term (current) use of antiplatelets/antithrombotics [Z79.02]        Comments         ANTICOAG TODAY: Anticoagulation Summary as of 11/19/2014    INR goal 2.0-3.0  Selected INR 3.00 (11/19/2014)  Next INR check 12/17/2014  Target end date    Indications  Atrial fibrillation status post cardioversion [I48.91] Encounter for long-term (current) use of antiplatelets/antithrombotics [Z79.02]      Anticoagulation Episode Summary    INR check location    Preferred lab    Send INR reminders to    Comments       PATIENT INSTRUCTIONS: Patient Instructions  Patient instructed to take medications as defined in the Anti-coagulation Track section of this encounter.  Patient instructed to OMIT today's dose on ALL MONDAYS and WEDNESDAYS--do NOT take warfarin on those 2 days of the week.  Patient verbalized understanding of these instructions.       FOLLOW-UP Return in 4 weeks (on 12/17/2014) for Follow up INR at 1045h.  Jorene Guest, III Pharm.D., CACP

## 2014-11-29 ENCOUNTER — Encounter (HOSPITAL_COMMUNITY): Payer: Self-pay | Admitting: Cardiovascular Disease

## 2014-12-04 ENCOUNTER — Other Ambulatory Visit: Payer: Self-pay | Admitting: *Deleted

## 2014-12-04 ENCOUNTER — Telehealth: Payer: Self-pay | Admitting: *Deleted

## 2014-12-04 DIAGNOSIS — I4891 Unspecified atrial fibrillation: Secondary | ICD-10-CM

## 2014-12-04 MED ORDER — WARFARIN SODIUM 5 MG PO TABS: 2.5000 mg | ORAL_TABLET | Freq: Every day | ORAL | Status: DC

## 2014-12-04 NOTE — Telephone Encounter (Signed)
Chart opened in error. Hilda Blades Jomaira Darr RN 12/04/14 11AM

## 2014-12-17 ENCOUNTER — Ambulatory Visit (INDEPENDENT_AMBULATORY_CARE_PROVIDER_SITE_OTHER): Payer: Medicare Other | Admitting: Pharmacist

## 2014-12-17 DIAGNOSIS — Z7902 Long term (current) use of antithrombotics/antiplatelets: Secondary | ICD-10-CM | POA: Diagnosis not present

## 2014-12-17 DIAGNOSIS — I4891 Unspecified atrial fibrillation: Secondary | ICD-10-CM | POA: Diagnosis not present

## 2014-12-17 LAB — POCT INR: INR: 2.5

## 2014-12-17 NOTE — Patient Instructions (Signed)
Patient instructed to take medications as defined in the Anti-coagulation Track section of this encounter.  Patient instructed to take today's dose.  Patient verbalized understanding of these instructions.    

## 2014-12-17 NOTE — Progress Notes (Signed)
Anti-Coagulation Progress Note  Daniel [name] is a 73 y.o. male who reports to the clinic for monitoring of anticoagulation treatment.    RECENT RESULTS: Recent results are below, the most recent result is correlated with a dose of 12.5 mg. per week: Lab Results  Component Value Date   INR 2.5 12/17/2014   INR 3.00 11/19/2014   INR 1.70 11/05/2014    Weekly dose was unchanged  ANTI-COAG DOSE: INR as of 12/17/2014 and Previous Dosing Information    INR Dt INR Goal [employer] Sun Mon Tue Wed Thu Fri Sat   12/17/2014 2.5 2.0-3.0 12.5 mg 2.5 mg 0 mg 2.5 mg 0 mg 2.5 mg 2.5 mg 2.5 mg    Anticoagulation Dose Instructions as of 12/17/2014      Total Sun Mon Tue Wed Thu Fri Sat   New Dose 12.5 mg 2.5 mg 0 mg 2.5 mg 0 mg 2.5 mg 2.5 mg 2.5 mg     (5 mg x 0.5)  -  (5 mg x 0.5)  -  (5 mg x 0.5)  (5 mg x 0.5)  (5 mg x 0.5)                           ANTICOAG SUMMARY: Anticoagulation Episode Summary    Current INR goal 2.0-3.0  Next INR check 01/14/2015  INR from last check 2.5 (12/17/2014)  Weekly max dose   Target end date   INR check location   Preferred lab   Send INR reminders to    Indications  Atrial fibrillation status post cardioversion [I48.91] Encounter for long-term (current) use of antiplatelets/antithrombotics [Z79.02]        Comments        PATIENT INSTRUCTIONS: Patient Instructions  Patient instructed to take medications as defined in the Anti-coagulation Track section of this encounter.  Patient instructed to take today's dose.  Patient verbalized understanding of these instructions.      FOLLOW-UP Return in about 4 weeks (around 01/14/2015) for Follow up INR on 01/14/15 at 10:30.  Flossie Dibble, PharmD BCPS, BCACP

## 2015-01-04 ENCOUNTER — Ambulatory Visit: Payer: Medicare Other | Admitting: Internal Medicine

## 2015-01-21 ENCOUNTER — Ambulatory Visit (INDEPENDENT_AMBULATORY_CARE_PROVIDER_SITE_OTHER): Payer: Medicare Other | Admitting: Pharmacist

## 2015-01-21 DIAGNOSIS — I4891 Unspecified atrial fibrillation: Secondary | ICD-10-CM

## 2015-01-21 DIAGNOSIS — Z7902 Long term (current) use of antithrombotics/antiplatelets: Secondary | ICD-10-CM

## 2015-01-21 LAB — POCT INR: INR: 2.1

## 2015-01-21 NOTE — Patient Instructions (Signed)
Patient instructed to take medications as defined in the Anti-coagulation Track section of this encounter.  Patient instructed to take today's dose.  Patient verbalized understanding of these instructions.    

## 2015-01-21 NOTE — Progress Notes (Signed)
Anti-Coagulation Progress Note  Daniel [name] is a 74 y.o. male who is currently on an anti-coagulation regimen.    RECENT RESULTS: Recent results are below, the most recent result is correlated with a dose of 12.5 mg. per week: Lab Results  Component Value Date   INR 2.10 01/21/2015   INR 2.5 12/17/2014   INR 3.00 11/19/2014    ANTI-COAG DOSE: Anticoagulation Dose Instructions as of 01/21/2015      [name] Tue Wed Thu Fri Sat   New Dose 2.5 mg 2.5 mg 2.5 mg 0 mg 2.5 mg 2.5 mg 2.5 mg       ANTICOAG SUMMARY: Anticoagulation Episode Summary    Current INR goal 2.0-3.0  Next INR check 02/11/2015  INR from last check 2.10 (01/21/2015)  Weekly max dose   Target end date   INR check location   Preferred lab   Send INR reminders to    Indications  Atrial fibrillation status post cardioversion [I48.91] Encounter for long-term (current) use of antiplatelets/antithrombotics [Z79.02]        Comments         ANTICOAG TODAY: Anticoagulation Summary as of 01/21/2015    INR goal 2.0-3.0  Selected INR 2.10 (01/21/2015)  Next INR check 02/11/2015  Target end date    Indications  Atrial fibrillation status post cardioversion [I48.91] Encounter for long-term (current) use of antiplatelets/antithrombotics [Z79.02]      Anticoagulation Episode Summary    INR check location    Preferred lab    Send INR reminders to    Comments       PATIENT INSTRUCTIONS: Patient Instructions  Patient instructed to take medications as defined in the Anti-coagulation Track section of this encounter.  Patient instructed to take today's dose.  Patient verbalized understanding of these instructions.       FOLLOW-UP Return in 3 weeks (on 02/11/2015) for Follow up INR at 1045h.  Jorene Guest, III Pharm.D., CACP

## 2015-02-08 ENCOUNTER — Telehealth: Payer: Self-pay | Admitting: Pharmacist

## 2015-02-08 NOTE — Telephone Encounter (Signed)
Call to patient to confirm appt for 02/11/15 at 11:00. lmtcb

## 2015-02-11 ENCOUNTER — Ambulatory Visit (INDEPENDENT_AMBULATORY_CARE_PROVIDER_SITE_OTHER): Payer: Medicare Other | Admitting: Pharmacist

## 2015-02-11 DIAGNOSIS — Z7901 Long term (current) use of anticoagulants: Secondary | ICD-10-CM | POA: Diagnosis not present

## 2015-02-11 DIAGNOSIS — I4891 Unspecified atrial fibrillation: Secondary | ICD-10-CM | POA: Diagnosis not present

## 2015-02-11 DIAGNOSIS — Z7902 Long term (current) use of antithrombotics/antiplatelets: Secondary | ICD-10-CM

## 2015-02-11 LAB — POCT INR: INR: 2

## 2015-02-11 NOTE — Patient Instructions (Signed)
Patient instructed to take medications as defined in the Anti-coagulation Track section of this encounter.  Patient instructed to take today's dose.  Patient verbalized understanding of these instructions.    

## 2015-02-11 NOTE — Progress Notes (Signed)
Anti-Coagulation Progress Note  Daniel [name] is a 74 y.o. male who is currently on an anti-coagulation regimen.    RECENT RESULTS: Recent results are below, the most recent result is correlated with a dose of 15 mg. per week: Lab Results  Component Value Date   INR 2.00 02/11/2015   INR 2.10 01/21/2015   INR 2.5 12/17/2014    ANTI-COAG DOSE: Anticoagulation Dose Instructions as of 02/11/2015      Sun Mon Tue Wed Thu Fri Sat   New Dose 2.5 mg 2.5 mg 2.5 mg 2.5 mg 2.5 mg 2.5 mg 2.5 mg       ANTICOAG SUMMARY: Anticoagulation Episode Summary    Current INR goal 2.0-3.0  Next INR check 02/25/2015  INR from last check 2.00 (02/11/2015)  Weekly max dose   Target end date   INR check location   Preferred lab   Send INR reminders to    Indications  Atrial fibrillation status post cardioversion [I48.91] Encounter for long-term (current) use of antiplatelets/antithrombotics [Z79.02]        Comments         ANTICOAG TODAY: Anticoagulation Summary as of 02/11/2015    INR goal 2.0-3.0  Selected INR 2.00 (02/11/2015)  Next INR check 02/25/2015  Target end date    Indications  Atrial fibrillation status post cardioversion [I48.91] Encounter for long-term (current) use of antiplatelets/antithrombotics [Z79.02]      Anticoagulation Episode Summary    INR check location    Preferred lab    Send INR reminders to    Comments       PATIENT INSTRUCTIONS: Patient Instructions  Patient instructed to take medications as defined in the Anti-coagulation Track section of this encounter.  Patient instructed to take today's dose.  Patient verbalized understanding of these instructions.       FOLLOW-UP Return in 2 weeks (on 02/25/2015) for Follow up INR at 1115h.  Jorene Guest, III Pharm.D., CACP

## 2015-02-25 ENCOUNTER — Ambulatory Visit (INDEPENDENT_AMBULATORY_CARE_PROVIDER_SITE_OTHER): Payer: Medicare Other | Admitting: Pharmacist

## 2015-02-25 DIAGNOSIS — Z7901 Long term (current) use of anticoagulants: Secondary | ICD-10-CM | POA: Diagnosis not present

## 2015-02-25 DIAGNOSIS — I4891 Unspecified atrial fibrillation: Secondary | ICD-10-CM

## 2015-02-25 DIAGNOSIS — Z7902 Long term (current) use of antithrombotics/antiplatelets: Secondary | ICD-10-CM

## 2015-02-25 LAB — POCT INR: INR: 2

## 2015-02-25 NOTE — Progress Notes (Signed)
Anti-Coagulation Progress Note  Daniel [name] is a 74 y.o. male who is currently on an anti-coagulation regimen.    RECENT RESULTS: Recent results are below, the most recent result is correlated with a dose of 17.5 mg. per week: Lab Results  Component Value Date   INR 2.00 02/25/2015   INR 2.00 02/11/2015   INR 2.10 01/21/2015    ANTI-COAG DOSE: Anticoagulation Dose Instructions as of 02/25/2015      [name] Tue Wed Thu Fri Sat   New Dose 2.5 mg 5 mg 2.5 mg 2.5 mg 2.5 mg 2.5 mg 2.5 mg       ANTICOAG SUMMARY: Anticoagulation Episode Summary    Current INR goal 2.0-3.0  Next INR check 03/25/2015  INR from last check 2.00 (02/25/2015)  Weekly max dose   Target end date   INR check location   Preferred lab   Send INR reminders to    Indications  Atrial fibrillation status post cardioversion [I48.91] Encounter for long-term (current) use of antiplatelets/antithrombotics [Z79.02]        Comments         ANTICOAG TODAY: Anticoagulation Summary as of 02/25/2015    INR goal 2.0-3.0  Selected INR 2.00 (02/25/2015)  Next INR check 03/25/2015  Target end date    Indications  Atrial fibrillation status post cardioversion [I48.91] Encounter for long-term (current) use of antiplatelets/antithrombotics [Z79.02]      Anticoagulation Episode Summary    INR check location    Preferred lab    Send INR reminders to    Comments       PATIENT INSTRUCTIONS: Patient Instructions  Patient instructed to take medications as defined in the Anti-coagulation Track section of this encounter.  Patient instructed to take today's dose.  Patient verbalized understanding of these instructions.       FOLLOW-UP Return in 4 weeks (on 03/25/2015) for Follow up INR at 1045h.  Jorene Guest, III Pharm.D., CACP

## 2015-02-25 NOTE — Patient Instructions (Signed)
Patient instructed to take medications as defined in the Anti-coagulation Track section of this encounter.  Patient instructed to take today's dose.  Patient verbalized understanding of these instructions.    

## 2015-03-12 ENCOUNTER — Other Ambulatory Visit: Payer: Self-pay | Admitting: Radiology

## 2015-03-12 DIAGNOSIS — I739 Peripheral vascular disease, unspecified: Secondary | ICD-10-CM

## 2015-03-22 ENCOUNTER — Telehealth: Payer: Self-pay | Admitting: Pharmacist

## 2015-03-22 NOTE — Telephone Encounter (Signed)
Call to patient to confirm appointment for 03/25/15 at 11:00 lmtcb.

## 2015-03-25 ENCOUNTER — Ambulatory Visit (INDEPENDENT_AMBULATORY_CARE_PROVIDER_SITE_OTHER): Payer: Medicare Other | Admitting: Pharmacist

## 2015-03-25 DIAGNOSIS — Z7901 Long term (current) use of anticoagulants: Secondary | ICD-10-CM

## 2015-03-25 DIAGNOSIS — I4891 Unspecified atrial fibrillation: Secondary | ICD-10-CM | POA: Diagnosis not present

## 2015-03-25 DIAGNOSIS — Z7902 Long term (current) use of antithrombotics/antiplatelets: Secondary | ICD-10-CM

## 2015-03-25 LAB — POCT INR: INR: 2.7

## 2015-03-25 NOTE — Patient Instructions (Signed)
Patient instructed to take medications as defined in the Anti-coagulation Track section of this encounter.  Patient instructed to take today's dose.  Patient verbalized understanding of these instructions.    

## 2015-03-25 NOTE — Progress Notes (Signed)
Anti-Coagulation Progress Note  Daniel [name] is a 74 y.o. male who is currently on an anti-coagulation regimen.    RECENT RESULTS: Recent results are below, the most recent result is correlated with a dose of 20 mg. per week: Lab Results  Component Value Date   INR 2.70 03/25/2015   INR 2.00 02/25/2015   INR 2.00 02/11/2015    ANTI-COAG DOSE: Anticoagulation Dose Instructions as of 03/25/2015      Sun Mon Tue Wed Thu Fri Sat   New Dose 2.5 mg 5 mg 2.5 mg 2.5 mg 2.5 mg 2.5 mg 2.5 mg       ANTICOAG SUMMARY: Anticoagulation Episode Summary    Current INR goal 2.0-3.0  Next INR check 04/22/2015  INR from last check 2.70 (03/25/2015)  Weekly max dose   Target end date   INR check location   Preferred lab   Send INR reminders to    Indications  Atrial fibrillation status post cardioversion [I48.91] Encounter for long-term (current) use of antiplatelets/antithrombotics [Z79.02]        Comments         ANTICOAG TODAY: Anticoagulation Summary as of 03/25/2015    INR goal 2.0-3.0  Selected INR 2.70 (03/25/2015)  Next INR check 04/22/2015  Target end date    Indications  Atrial fibrillation status post cardioversion [I48.91] Encounter for long-term (current) use of antiplatelets/antithrombotics [Z79.02]      Anticoagulation Episode Summary    INR check location    Preferred lab    Send INR reminders to    Comments       PATIENT INSTRUCTIONS: Patient Instructions  Patient instructed to take medications as defined in the Anti-coagulation Track section of this encounter.  Patient instructed to take today's dose.  Patient verbalized understanding of these instructions.       FOLLOW-UP Return in 4 weeks (on 04/22/2015) for Follow up INR at 1100h.  Jorene Guest, III Pharm.D., CACP

## 2015-03-27 NOTE — Progress Notes (Signed)
I have reviewed Dr. Gladstone Pih note.  [name] is on anticoagulation for Afib.

## 2015-03-29 ENCOUNTER — Encounter: Payer: Self-pay | Admitting: Internal Medicine

## 2015-03-29 ENCOUNTER — Other Ambulatory Visit: Payer: Self-pay | Admitting: Internal Medicine

## 2015-03-29 ENCOUNTER — Ambulatory Visit (INDEPENDENT_AMBULATORY_CARE_PROVIDER_SITE_OTHER): Payer: Medicare Other | Admitting: Internal Medicine

## 2015-03-29 VITALS — BP 175/67 | HR 57 | Temp 97.9°F | Wt 112.3 lb

## 2015-03-29 DIAGNOSIS — K635 Polyp of colon: Secondary | ICD-10-CM

## 2015-03-29 DIAGNOSIS — E785 Hyperlipidemia, unspecified: Secondary | ICD-10-CM | POA: Diagnosis not present

## 2015-03-29 DIAGNOSIS — Z720 Tobacco use: Secondary | ICD-10-CM | POA: Diagnosis not present

## 2015-03-29 DIAGNOSIS — N62 Hypertrophy of breast: Secondary | ICD-10-CM

## 2015-03-29 DIAGNOSIS — I10 Essential (primary) hypertension: Secondary | ICD-10-CM

## 2015-03-29 DIAGNOSIS — M47815 Spondylosis without myelopathy or radiculopathy, thoracolumbar region: Secondary | ICD-10-CM | POA: Diagnosis not present

## 2015-03-29 DIAGNOSIS — I4891 Unspecified atrial fibrillation: Secondary | ICD-10-CM

## 2015-03-29 DIAGNOSIS — F17210 Nicotine dependence, cigarettes, uncomplicated: Secondary | ICD-10-CM

## 2015-03-29 DIAGNOSIS — D126 Benign neoplasm of colon, unspecified: Secondary | ICD-10-CM

## 2015-03-29 MED ORDER — HYDROCODONE-ACETAMINOPHEN 10-325 MG PO TABS: 1.0000 | ORAL_TABLET | Freq: Two times a day (BID) | ORAL | Status: DC | PRN

## 2015-03-29 MED ORDER — ENALAPRIL MALEATE 20 MG PO TABS: 20.0000 mg | ORAL_TABLET | Freq: Two times a day (BID) | ORAL | Status: DC

## 2015-03-29 NOTE — Patient Instructions (Signed)
It was good to see you again.  1) We started Narco 10-325 1 tablet as needed up to twice a day as need.  Dispense #30.  You signed a pain contract so you will need to pick up the written prescription each month from the clinic (the law).  2) Increased your enalapril to 20 mg twice daily when you finish the current bottle.  This is for your blood pressure.  3) I ordered a mammogram of your left breast to investigate the swelling and tenderness.  4) Keep taking the other medications the same.  I will see you back for a blood pressure check in 3 months.

## 2015-03-29 NOTE — Assessment & Plan Note (Signed)
He notes a several month history of left-sided breast swelling and severe tenderness of the left nipple. On examination he has mild swelling superior and lateral to the left breast and significant tenderness to palpation of the left nipple. These findings are not present on the right side. Review of his medications revealed no pharmacotherapy that is classically associated with gynecomastia. We will further assess the mild swelling and tenderness with mammography. A referral was submitted. He will be provided with an appointment for follow-up in mammography early next week.

## 2015-03-29 NOTE — Assessment & Plan Note (Signed)
He is due for surveillance colonoscopy. I discussed this fact with him and he is not interested in pursuing such intervention at this time. I will rediscuss this issue at the follow-up visit and stress the risks that are associated with failure of not following up from a surveillance standpoint.

## 2015-03-29 NOTE — Assessment & Plan Note (Signed)
He has been asymptomatic on the dronedarone 400 mg by mouth twice daily. We will therefore continue with this medication. Of note, he is also on metoprolol XL 75 mg by mouth daily should he require rate control. Finally, he is anticoagulated with warfarin and is followed closely in the anticoagulation clinic. We will continue all of these therapies.

## 2015-03-29 NOTE — Assessment & Plan Note (Signed)
He states he continues to smoke about a half a pack per day. We discussed the risks associated with continued smoking. At this point he is not interested in smoking cessation. We will readdress this issue at the follow-up visit.

## 2015-03-29 NOTE — Assessment & Plan Note (Signed)
He is on the high intensity statin atorvastatin 20 mg by mouth daily. Recent guidelines recommend against following lipid panels. We will therefore continue the atorvastatin at 20 mg by mouth daily.

## 2015-03-29 NOTE — Assessment & Plan Note (Signed)
He continues to have low back pain of the thoracic or lumbar spine. He has tried an occasional Narco 10 mg tablet from his girlfriend that has been effective. He is asking for this medication. As he has a history of gastric ulcer and is on chronic Coumadin for his atrial fibrillation there are relative contraindications to starting nonsteroidal anti-inflammatory therapy. I therefore will prescribe hydrocodone-acetaminophen 5-325 mg by mouth 1 tablet every 12 hours as needed for back pain dispense #30 per month. He was given a hard copy of the prescription to take to the pharmacy. A pain contract was reviewed and signed. We will reassess the control of his pain on this medication at the follow-up visit.

## 2015-03-29 NOTE — Progress Notes (Signed)
   Subjective:    Patient ID: Daniel [name], male    DOB: [birth year], 74 y.o.   MRN: [id number]  HPI  Please see the A&P for the status of the pt's chronic medical problems.  Review of Systems  Constitutional: Negative for fever, chills, diaphoresis, activity change, appetite change and unexpected weight change.  Respiratory: Negative for chest tightness, shortness of breath and wheezing.   Cardiovascular: Negative for chest pain, palpitations and leg swelling.  Gastrointestinal: Negative for nausea, vomiting, abdominal pain, diarrhea, constipation and abdominal distention.  Musculoskeletal: Positive for back pain. Negative for myalgias, joint swelling and gait problem.  Skin: Negative for color change, rash and wound.      Objective:   Physical Exam  Constitutional: He is oriented to person, place, and time. He appears well-developed and well-nourished. No distress.  HENT:  Head: Normocephalic and atraumatic.  Eyes: Conjunctivae are normal. Right eye exhibits no discharge. Left eye exhibits no discharge. No scleral icterus.  Cardiovascular: Normal rate, regular rhythm and normal heart sounds.   With apparent extra systoles  Pulmonary/Chest: Effort normal and breath sounds normal. No respiratory distress. He has no wheezes. He has no rales.  Abdominal: Soft. Bowel sounds are normal. He exhibits no distension. There is no tenderness. There is no rebound and no guarding.  Musculoskeletal: Normal range of motion. He exhibits no edema or tenderness.  Neurological: He is alert and oriented to person, place, and time. He exhibits normal muscle tone.  Skin: Skin is warm and dry. No rash noted. He is not diaphoretic. No erythema.  Psychiatric: He has a normal mood and affect. His behavior is normal. Judgment and thought content normal.      Assessment & Plan:   Please see Problem Oriented Charting.

## 2015-03-29 NOTE — Assessment & Plan Note (Signed)
He is due for the Prevnar Pneumovax, tetanus booster, and Zostavax. He is not interested in any of these interventions at this time. I'm concerned this may be secondary to concerns about costs. We will readdress these preventative measures at the follow-up visit.

## 2015-03-29 NOTE — Assessment & Plan Note (Signed)
His blood pressure today was elevated at 175/67. This is on enalapril 10 mg by mouth twice daily and metoprolol XL 75 mg by mouth daily. At the last clinic visit the amlodipine 5 mg by mouth daily and Lasix were stopped as Daniel [name] wanted to decrease the number of medications he was taking. This is likely the reason for the current hypertension. We will increase the enalapril to 20 mg by mouth twice daily and continue the metoprolol XL at 75 mg by mouth daily. We will repeat the blood pressure in 3 months and if still elevated, restart the amlodipine at 5 mg by mouth daily.

## 2015-04-03 ENCOUNTER — Ambulatory Visit
Admission: RE | Admit: 2015-04-03 | Discharge: 2015-04-03 | Disposition: A | Discharge status: Home / Self Care | Payer: Medicare Other | Source: Ambulatory Visit | Attending: Internal Medicine | Admitting: Internal Medicine

## 2015-04-03 DIAGNOSIS — N62 Hypertrophy of breast: Secondary | ICD-10-CM | POA: Diagnosis not present

## 2015-04-12 ENCOUNTER — Encounter: Payer: Medicare Other | Admitting: Internal Medicine

## 2015-04-22 ENCOUNTER — Ambulatory Visit (INDEPENDENT_AMBULATORY_CARE_PROVIDER_SITE_OTHER): Payer: Medicare Other | Admitting: Pharmacist

## 2015-04-22 DIAGNOSIS — Z7901 Long term (current) use of anticoagulants: Secondary | ICD-10-CM | POA: Diagnosis present

## 2015-04-22 DIAGNOSIS — Z7902 Long term (current) use of antithrombotics/antiplatelets: Secondary | ICD-10-CM

## 2015-04-22 DIAGNOSIS — I4891 Unspecified atrial fibrillation: Secondary | ICD-10-CM

## 2015-04-22 LAB — POCT INR: INR: 2.6

## 2015-04-22 MED ORDER — WARFARIN SODIUM 5 MG PO TABS: ORAL_TABLET | ORAL | Status: DC

## 2015-04-22 NOTE — Patient Instructions (Signed)
Patient instructed to take medications as defined in the Anti-coagulation Track section of this encounter.  Patient instructed to take today's dose.  Patient verbalized understanding of these instructions.    

## 2015-04-22 NOTE — Progress Notes (Signed)
INTERNAL MEDICINE TEACHING ATTENDING ADDENDUM - Aldine Contes M.D  Duration- indefinite, Indication- afib, INR- therapeutic. Agree with Dr. Gladstone Pih recommendations as outlined in his note.

## 2015-04-22 NOTE — Progress Notes (Signed)
Anti-Coagulation Progress Note  Daniel [name] is a 74 y.o. male who is currently on an anti-coagulation regimen.    RECENT RESULTS: Recent results are below, the most recent result is correlated with a dose of 20 mg. per week: Lab Results  Component Value Date   INR 2.60 04/22/2015   INR 2.70 03/25/2015   INR 2.00 02/25/2015    ANTI-COAG DOSE: Anticoagulation Dose Instructions as of 04/22/2015      [name] Tue Wed Thu Fri Sat   New Dose 2.5 mg 5 mg 2.5 mg 2.5 mg 2.5 mg 2.5 mg 2.5 mg       ANTICOAG SUMMARY: Anticoagulation Episode Summary    Current INR goal 2.0-3.0  Next INR check 05/27/2015  INR from last check 2.60 (04/22/2015)  Weekly max dose   Target end date   INR check location   Preferred lab   Send INR reminders to    Indications  Atrial fibrillation status post cardioversion [I48.91] Encounter for long-term (current) use of antiplatelets/antithrombotics [Z79.02]        Comments         ANTICOAG TODAY: Anticoagulation Summary as of 04/22/2015    INR goal 2.0-3.0  Selected INR 2.60 (04/22/2015)  Next INR check 05/27/2015  Target end date    Indications  Atrial fibrillation status post cardioversion [I48.91] Encounter for long-term (current) use of antiplatelets/antithrombotics [Z79.02]      Anticoagulation Episode Summary    INR check location    Preferred lab    Send INR reminders to    Comments       PATIENT INSTRUCTIONS: Patient Instructions  Patient instructed to take medications as defined in the Anti-coagulation Track section of this encounter.  Patient instructed to take today's dose.  Patient verbalized understanding of these instructions.       FOLLOW-UP Return in 5 weeks (on 05/27/2015) for Follow up INR at 1100h.  Jorene Guest, III Pharm.D., CACP

## 2015-04-25 ENCOUNTER — Ambulatory Visit (HOSPITAL_BASED_OUTPATIENT_CLINIC_OR_DEPARTMENT_OTHER): Payer: Medicare Other

## 2015-04-25 ENCOUNTER — Ambulatory Visit (HOSPITAL_COMMUNITY): Payer: Medicare Other | Attending: Internal Medicine

## 2015-04-25 DIAGNOSIS — I714 Abdominal aortic aneurysm, without rupture: Secondary | ICD-10-CM | POA: Insufficient documentation

## 2015-04-25 DIAGNOSIS — I10 Essential (primary) hypertension: Secondary | ICD-10-CM | POA: Diagnosis not present

## 2015-04-25 DIAGNOSIS — J449 Chronic obstructive pulmonary disease, unspecified: Secondary | ICD-10-CM | POA: Insufficient documentation

## 2015-04-25 DIAGNOSIS — I739 Peripheral vascular disease, unspecified: Secondary | ICD-10-CM

## 2015-04-25 DIAGNOSIS — I2510 Atherosclerotic heart disease of native coronary artery without angina pectoris: Secondary | ICD-10-CM | POA: Insufficient documentation

## 2015-04-25 DIAGNOSIS — E785 Hyperlipidemia, unspecified: Secondary | ICD-10-CM | POA: Insufficient documentation

## 2015-04-25 DIAGNOSIS — Z720 Tobacco use: Secondary | ICD-10-CM | POA: Insufficient documentation

## 2015-04-29 ENCOUNTER — Encounter: Payer: Self-pay | Admitting: Cardiology

## 2015-04-29 ENCOUNTER — Ambulatory Visit (INDEPENDENT_AMBULATORY_CARE_PROVIDER_SITE_OTHER): Payer: Medicare Other | Admitting: Cardiology

## 2015-04-29 VITALS — BP 178/72 | HR 48 | Ht 69.0 in | Wt 112.0 lb

## 2015-04-29 DIAGNOSIS — I739 Peripheral vascular disease, unspecified: Secondary | ICD-10-CM | POA: Diagnosis not present

## 2015-04-29 DIAGNOSIS — Z720 Tobacco use: Secondary | ICD-10-CM

## 2015-04-29 DIAGNOSIS — I4891 Unspecified atrial fibrillation: Secondary | ICD-10-CM

## 2015-04-29 DIAGNOSIS — I10 Essential (primary) hypertension: Secondary | ICD-10-CM

## 2015-04-29 DIAGNOSIS — E785 Hyperlipidemia, unspecified: Secondary | ICD-10-CM

## 2015-04-29 LAB — BASIC METABOLIC PANEL
BUN: 8 mg/dL (ref 6–23)
CO2: 27 mEq/L (ref 19–32)
Calcium: 9.9 mg/dL (ref 8.4–10.5)
Chloride: 106 mEq/L (ref 96–112)
Creatinine, Ser: 1.3 mg/dL (ref 0.40–1.50)
GFR: 69.44 mL/min (ref >60.00)
Glucose, Bld: 83 mg/dL (ref 70–99)
Potassium: 4.2 mEq/L (ref 3.5–5.1)
Sodium: 136 mEq/L (ref 135–145)

## 2015-04-29 LAB — TSH: TSH: 1.42 u[IU]/mL (ref 0.35–4.50)

## 2015-04-29 NOTE — Patient Instructions (Signed)
Medication Instructions:  Your physician recommends that you continue on your current medications as directed. Please refer to the Current Medication list given to you today.  Labwork: Your physician recommends that you have lab work today: TSH and BMP  Testing/Procedures: Your physician has requested that you have a carotid duplex. This test is an ultrasound of the carotid arteries in your neck. It looks at blood flow through these arteries that supply the brain with blood. Allow one hour for this exam. There are no restrictions or special instructions.  Follow-Up: Your physician wants you to follow-up in: 6 MONTHS with Dr Aundra Dubin. You will receive a reminder letter in the mail two months in advance. If you don't receive a letter, please call our office to schedule the follow-up appointment.   Any Other Special Instructions Will Be Listed Below (If Applicable).
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
## 2015-05-01 ENCOUNTER — Telehealth: Payer: Self-pay | Admitting: Cardiology

## 2015-05-01 NOTE — Telephone Encounter (Signed)
New Prob   Pt is calling to follow up on test results. Please call.

## 2015-05-01 NOTE — Telephone Encounter (Signed)
Notified of lab results. 

## 2015-05-06 ENCOUNTER — Ambulatory Visit (HOSPITAL_COMMUNITY): Payer: Medicare Other | Attending: Cardiology

## 2015-05-06 DIAGNOSIS — R0989 Other specified symptoms and signs involving the circulatory and respiratory systems: Secondary | ICD-10-CM | POA: Diagnosis not present

## 2015-05-06 DIAGNOSIS — R51 Headache: Secondary | ICD-10-CM | POA: Diagnosis not present

## 2015-05-06 DIAGNOSIS — I2510 Atherosclerotic heart disease of native coronary artery without angina pectoris: Secondary | ICD-10-CM | POA: Diagnosis not present

## 2015-05-06 DIAGNOSIS — I739 Peripheral vascular disease, unspecified: Secondary | ICD-10-CM | POA: Diagnosis not present

## 2015-05-06 DIAGNOSIS — I10 Essential (primary) hypertension: Secondary | ICD-10-CM | POA: Diagnosis not present

## 2015-05-06 DIAGNOSIS — E785 Hyperlipidemia, unspecified: Secondary | ICD-10-CM | POA: Diagnosis not present

## 2015-05-06 DIAGNOSIS — Z720 Tobacco use: Secondary | ICD-10-CM | POA: Insufficient documentation

## 2015-05-06 DIAGNOSIS — J449 Chronic obstructive pulmonary disease, unspecified: Secondary | ICD-10-CM | POA: Insufficient documentation

## 2015-05-27 ENCOUNTER — Ambulatory Visit (INDEPENDENT_AMBULATORY_CARE_PROVIDER_SITE_OTHER): Payer: Medicare Other | Admitting: Pharmacist

## 2015-05-27 DIAGNOSIS — Z7901 Long term (current) use of anticoagulants: Secondary | ICD-10-CM

## 2015-05-27 DIAGNOSIS — I4891 Unspecified atrial fibrillation: Secondary | ICD-10-CM

## 2015-05-27 DIAGNOSIS — Z7902 Long term (current) use of antithrombotics/antiplatelets: Secondary | ICD-10-CM

## 2015-05-27 LAB — POCT INR: INR: 2.8

## 2015-05-27 NOTE — Progress Notes (Signed)
Anti-Coagulation Progress Note  Daniel [name] is a 74 y.o. male who is currently on an anti-coagulation regimen.    RECENT RESULTS: Recent results are below, the most recent result is correlated with a dose of 20 mg. per week:  When patient entered the exam room he was angry stating specifically:  "Am I going to have to continue to come here to have my blood checked". We discussed why (in order to provide benefits of efficacy and safety on warfarin) these visits have been necessary on warfarin. He stated he is aware that there is a reversal agent for Pradaxa (Praxbind/idarucizamab) and that he wishes to go BACK on Pradaxa. We reviewed the basis of the clinical decision to switch from Pradaxa last July 2015 to warfarin. Patient was reminded that his antiarythmic drug was switched from amiodarone to [facility] and he was reminded of the reason. We reviewed together the package insert for Pradaxa and the drug-drug interactions. For the reduction in risk of stroke and systemic embolism in non-valvular atrial fibrillation the FDA approved labeling for the drug's dosage is the following:  For Clcr 30 to 50 mL/min with the concomitant use of a P-gp inhibitor (such as dronedarone as specifically cited in PI) the instructions are to REDUCE DOSE to 75mg  twice daily. Last recorded Scr in May 2016 for patient was 1.30 mg%. Calculated Creatinine Clearance (method of Cockoft and Gault--using total body weight as is procedure for DOACs) the patient's Clcr is 37 mL/min. As such, his dose of dabigatran/Pradaxa is 75mg  by mouth twice daily. Suggestion would be to see patient in follow up in 3-4 months as per [place] Guidelines (in absence of current [place] guidelines) for re-check of renal function. Patient understands why a dose reduction would now be warranted. Patient understands that there is a known reversal agent for dabigatran/Pradaxa (reversal with idarucizamab/Praxbind) for which our healthcare system has on-formulary and  keeps stocked. Patient requests to be taken off warfarin and placed back on Pradaxa citing that the reason he was switched (drug-drug interaction dronedarone + Pradaxa and potential for increase area under the concentration curve--and at that time, no known reversal agent) no longer exists, i.e. He verbalizes to me his knowledge of a known reversal agent for Pradaxa. This dose 75mg  PO BID is consistent with the labeling for the drug. He was advised we need INR to be less than 2.0 after discontinuation of warfarin. He understands I will call him and apprise him of the shared decision with his physician.   Lab Results  Component Value Date   INR 2.80 05/27/2015   INR 2.60 04/22/2015   INR 2.70 03/25/2015    ANTI-COAG DOSE: Anticoagulation Dose Instructions as of 05/27/2015      Sun Mon Tue Wed Thu Fri Sat   New Dose Hold Hold Hold Hold Hold Hold Hold       ANTICOAG SUMMARY: Anticoagulation Episode Summary    Current INR goal 2.0-3.0  Next INR check 06/03/2015  INR from last check 2.80 (05/27/2015)  Weekly max dose   Target end date   INR check location   Preferred lab   Send INR reminders to    Indications  Atrial fibrillation status post cardioversion [I48.91] Encounter for long-term (current) use of antiplatelets/antithrombotics [Z79.02]        Comments         ANTICOAG TODAY: Anticoagulation Summary as of 05/27/2015    INR goal 2.0-3.0  Selected INR 2.80 (05/27/2015)  Next INR check 06/03/2015  Target end  date    Indications  Atrial fibrillation status post cardioversion [I48.91] Encounter for long-term (current) use of antiplatelets/antithrombotics [Z79.02]      Anticoagulation Episode Summary    INR check location    Preferred lab    Send INR reminders to    Comments       PATIENT INSTRUCTIONS: Patient Instructions  Patient instructed to take medications as defined in the Anti-coagulation Track section of this encounter.  Patient instructed to HOLD/OMIT all doses of  warfarin. Will contact you in next 24-48h regarding your physicians shared decision on re-commencing Pradaxa at the appropriate dose for your renal function. Patient verbalized understanding of these instructions.       FOLLOW-UP Return in 7 days (on 06/03/2015) for Follow up on decision to discontinue warfarin and transiton back on to Pradaxa.  Jorene Guest, III Pharm.D., CACP

## 2015-05-27 NOTE — Patient Instructions (Signed)
Patient instructed to take medications as defined in the Anti-coagulation Track section of this encounter.  Patient instructed to HOLD/OMIT all doses of warfarin. Will contact you in next 24-48h regarding your physicians shared decision on re-commencing Pradaxa at the appropriate dose for your renal function. Patient verbalized understanding of these instructions.

## 2015-05-27 NOTE — Progress Notes (Signed)
Appreciate the time and education spent with the patient.  I agree with the plan as outlined by Dr. Elie Confer.

## 2015-05-28 ENCOUNTER — Telehealth: Payer: Self-pay | Admitting: Pharmacist

## 2015-05-28 MED ORDER — DABIGATRAN ETEXILATE MESYLATE 75 MG PO CAPS: 75.0000 mg | ORAL_CAPSULE | Freq: Two times a day (BID) | ORAL | Status: DC

## 2015-05-28 NOTE — Telephone Encounter (Signed)
Patient was called and apprised of discussion and clinical decision incorporating his wishes (the patient) to be switched OFF warfarin ONTO dabigatran 75mg  PO BID. This discussion was held with Dr. Eppie Gibson. Patient contacted to CONTINUE to hold warfarin. Patient will commence dabigatran 75mg  PO twice daily tomorrow. Patient was reminded of his responsibilities while on anticoagulation--i.e. If he were to observe any symptoms or signs of bleeding such as "blood in urine, stool, nose-bleeds, throwing up blood, coughing up blood, increased bruising, severe headache with projectile nausea or vomiting with visual changes or impairment, any new medications, any change in urinary output (decrease) or if he were to be told by any provider that his renal function has changed"--he should contact the clinic or in an emergency--to report to the ED. He verbalized understanding of this and was able to repeat back.

## 2015-06-03 ENCOUNTER — Ambulatory Visit: Payer: Medicare Other

## 2015-06-28 ENCOUNTER — Other Ambulatory Visit: Payer: Self-pay | Admitting: *Deleted

## 2015-06-28 DIAGNOSIS — M47815 Spondylosis without myelopathy or radiculopathy, thoracolumbar region: Secondary | ICD-10-CM

## 2015-06-28 MED ORDER — HYDROCODONE-ACETAMINOPHEN 10-325 MG PO TABS: 1.0000 | ORAL_TABLET | Freq: Two times a day (BID) | ORAL | Status: DC | PRN

## 2015-06-28 NOTE — Telephone Encounter (Signed)
Last refill 6/7 # 30 Pt is here

## 2015-06-28 NOTE — Telephone Encounter (Signed)
One Rx given he will return on the 8th on aug for #2

## 2015-08-31 ENCOUNTER — Other Ambulatory Visit: Payer: Self-pay | Admitting: Internal Medicine

## 2015-08-31 DIAGNOSIS — I4891 Unspecified atrial fibrillation: Secondary | ICD-10-CM

## 2015-09-10 ENCOUNTER — Ambulatory Visit: Payer: Medicare Other | Admitting: Cardiovascular Disease

## 2015-09-13 ENCOUNTER — Ambulatory Visit (INDEPENDENT_AMBULATORY_CARE_PROVIDER_SITE_OTHER): Payer: Medicare Other | Admitting: Internal Medicine

## 2015-09-13 ENCOUNTER — Encounter: Payer: Self-pay | Admitting: Internal Medicine

## 2015-09-13 VITALS — BP 160/53 | HR 58 | Temp 98.4°F | Wt 113.4 lb

## 2015-09-13 DIAGNOSIS — N62 Hypertrophy of breast: Secondary | ICD-10-CM

## 2015-09-13 DIAGNOSIS — I4891 Unspecified atrial fibrillation: Secondary | ICD-10-CM

## 2015-09-13 DIAGNOSIS — Z7901 Long term (current) use of anticoagulants: Secondary | ICD-10-CM

## 2015-09-13 DIAGNOSIS — M47815 Spondylosis without myelopathy or radiculopathy, thoracolumbar region: Secondary | ICD-10-CM | POA: Diagnosis not present

## 2015-09-13 DIAGNOSIS — I10 Essential (primary) hypertension: Secondary | ICD-10-CM

## 2015-09-13 DIAGNOSIS — Z86010 Personal history of colonic polyps: Secondary | ICD-10-CM

## 2015-09-13 DIAGNOSIS — Z720 Tobacco use: Secondary | ICD-10-CM

## 2015-09-13 DIAGNOSIS — E785 Hyperlipidemia, unspecified: Secondary | ICD-10-CM | POA: Diagnosis not present

## 2015-09-13 DIAGNOSIS — Z0000 Encounter for general adult medical examination without abnormal findings: Secondary | ICD-10-CM

## 2015-09-13 DIAGNOSIS — D126 Benign neoplasm of colon, unspecified: Secondary | ICD-10-CM

## 2015-09-13 DIAGNOSIS — F17200 Nicotine dependence, unspecified, uncomplicated: Secondary | ICD-10-CM | POA: Diagnosis not present

## 2015-09-13 DIAGNOSIS — I739 Peripheral vascular disease, unspecified: Secondary | ICD-10-CM

## 2015-09-13 DIAGNOSIS — E538 Deficiency of other specified B group vitamins: Secondary | ICD-10-CM | POA: Diagnosis not present

## 2015-09-13 DIAGNOSIS — Z79891 Long term (current) use of opiate analgesic: Secondary | ICD-10-CM | POA: Diagnosis not present

## 2015-09-13 MED ORDER — HYDROCODONE-ACETAMINOPHEN 10-325 MG PO TABS: 1.0000 | ORAL_TABLET | Freq: Two times a day (BID) | ORAL | Status: DC | PRN

## 2015-09-13 NOTE — Assessment & Plan Note (Signed)
At the last visit he noted unilateral gynecomastia with tenderness. A mammogram was negative. The symptoms have completely resolved. No further workup is necessary.

## 2015-09-13 NOTE — Assessment & Plan Note (Signed)
He is tolerating the atorvastatin at 20 mg by mouth daily without myalgias. We will therefore continue this regimen.

## 2015-09-13 NOTE — Assessment & Plan Note (Signed)
As noted above he was offered a colonoscopy and encouraged to quit smoking. He is not interested in the flu vaccination or a tetanus booster. We will reassess his openness to preventative healthcare at the follow-up visit.

## 2015-09-13 NOTE — Assessment & Plan Note (Signed)
He states the hydrocodone-acetaminophen 10-325 mg is effective at controlling his neck and knee osteoarthritis and allows him to function with little difficulty. That being said, he states he requires 2 tablets a day to remain fully functional. His last dose was yesterday. I will check a urine drug screen today and if it is negative it is possible that it may be secondary to his last dose being just over 24 hours ago. At the same time, we will look for other substances to make sure there is not abuse of illicit drugs. A review of the [place] narcotic database shows no irregularities. Given the functional improvement with the 2 tablets a day I increased his monthly allotment to 60 tablets per month and gave him a written prescription. We will follow-up on the results of the UDS which are pending at this time and reassess the control of his pain and his functionality at the follow-up visit.

## 2015-09-13 NOTE — Assessment & Plan Note (Signed)
He is due for a follow-up colonoscopy. He gets very angry every time I bring this up as he feels he was not notified of a polyp in the distant past until year after the procedure. I apologized for this error although I could not explain to him why it happened at that time as I was not his physician. Nonetheless, I tried to refocus his attention to the fact that he remains at risk for developing recurrent polyps that could deteriorate into colon cancer. He currently is not interested in repeat colonoscopy. I will readdress the issue at the follow-up visit.

## 2015-09-13 NOTE — Assessment & Plan Note (Signed)
His blood pressure today was elevated at 160/53. This is on enalapril 20 mg by mouth twice daily and metoprolol XL 75 mg by mouth daily. He states at home he checks his blood pressure and his systolic pressures are in the 120s and 130s. Given his home readings we will continue the enalapril and metoprolol XL at the current doses. At the follow-up visit I may ask him to bring in his pressure cuff to make sure it is consistent with our readings. A basic metabolic panel was obtained today and is pending at the time of this dictation.

## 2015-09-13 NOTE — Assessment & Plan Note (Signed)
He denies any symptoms of claudication. We continue to work on his blood pressure and we will continue to assess his readiness to quit smoking. He also is tolerating the atorvastatin well. We will reassess for any symptoms at the follow-up visit.

## 2015-09-13 NOTE — Assessment & Plan Note (Signed)
I again brought up the risks associated with continued smoking, especially in light of his peripheral vascular disease. He stated he did not wanted talk about it any further. I will reassess his openness to discuss the issue at the follow-up visit.

## 2015-09-13 NOTE — Patient Instructions (Addendum)
It was good to see you again.  1) Keep taking your medications as you are.  2) We checked your blood and urine today.  I will call you early next week when I get the results if anything is concerning.  3) I increased the hydrocodone to 60 tablets a month because you are doing well with that dose and are very functional when taking them.  I will see you back in 3 months, sooner if necessary

## 2015-09-13 NOTE — Assessment & Plan Note (Signed)
On my exam today he appears regular and is likely a normal sinus rhythm. This is on droedarone 400 mg by mouth twice daily. He is also on Pradaxa 75 mg by mouth twice daily for anticoagulation. He is tolerating this well without any overt bleeding. Finally, he remains on metoprolol XL 75 mg by mouth daily in case he should flip back into atrial fibrillation. We will continue this regimen at these doses and reassess his exam and symptoms at the follow-up visit.

## 2015-09-13 NOTE — Progress Notes (Signed)
   Subjective:    Patient ID: Daniel [name], male    DOB: 1941-07-23, 74 y.o.   MRN: [id number]  HPI  [name] is here for follow-up of his neck pain, essential hypertension, history of atrial fibrillation, and PVOD. Please see the A&P for the status of the pt's chronic medical problems.  Review of Systems  Constitutional: Negative for activity change, appetite change and unexpected weight change.  Respiratory: Negative for shortness of breath.   Cardiovascular: Negative for chest pain, palpitations and leg swelling.  Gastrointestinal: Negative for nausea, vomiting, abdominal pain, diarrhea, constipation and abdominal distention.  Musculoskeletal: Positive for arthralgias and neck pain. Negative for myalgias, joint swelling and neck stiffness.       Knee and neck arthralgias.  Skin: Negative for color change, rash and wound.      Objective:   Physical Exam  Constitutional: He is oriented to person, place, and time. He appears well-developed and well-nourished. No distress.  HENT:  Head: Normocephalic and atraumatic.  Eyes: Conjunctivae are normal. Right eye exhibits no discharge. Left eye exhibits no discharge. No scleral icterus.  Neck: Normal range of motion. Neck supple.  Cardiovascular: Normal rate, regular rhythm and normal heart sounds.  Exam reveals no gallop and no friction rub.   No murmur heard. Pulmonary/Chest: Effort normal and breath sounds normal. No respiratory distress. He has no wheezes. He has no rales.  Abdominal: Soft. Bowel sounds are normal. He exhibits no distension. There is no tenderness. There is no rebound and no guarding.  Musculoskeletal: Normal range of motion. He exhibits no edema or tenderness.  Neurological: He is alert and oriented to person, place, and time. He exhibits normal muscle tone.  Skin: Skin is warm and dry. No rash noted. He is not diaphoretic. No erythema.  Psychiatric: He has a normal mood and affect. His behavior is normal.  Judgment and thought content normal.  Nursing note and vitals reviewed.     Assessment & Plan:   Please see problem oriented charting.

## 2015-09-13 NOTE — Assessment & Plan Note (Signed)
He notes occasional tingling and pin stabbing pains in his legs that occur at any time. When I asked him about a history of B12 deficiency he mentioned that he was diagnosed with it in the past and had received B12 shots as well as using oral B12 supplementation. He decided to stop the medication and has not been on it for some time. Review of our B12 levels demonstrate values in the 300s and 400s. As the symptoms could be related to a B12 deficiency we repeated the vitamin B12 level today and itis pending at the time of this dictation. We will follow-up on this result.

## 2015-09-14 LAB — VITAMIN B12: Vitamin B-12: 303 pg/mL (ref 211–946)

## 2015-09-14 LAB — BMP8+ANION GAP
Anion Gap: 17 mmol/L (ref 10.0–18.0)
BUN/Creatinine Ratio: 5 — ABNORMAL LOW (ref 10–22)
BUN: 7 mg/dL — ABNORMAL LOW (ref 8–27)
CO2: 20 mmol/L (ref 18–29)
Calcium: 9.9 mg/dL (ref 8.6–10.2)
Chloride: 105 mmol/L (ref 97–108)
Creatinine, Ser: 1.39 mg/dL — ABNORMAL HIGH (ref 0.76–1.27)
GFR calc Af Amer: 57 mL/min/{1.73_m2} — ABNORMAL LOW (ref >59)
GFR calc non Af Amer: 50 mL/min/{1.73_m2} — ABNORMAL LOW (ref >59)
Glucose: 85 mg/dL (ref 65–99)
Potassium: 5 mmol/L (ref 3.5–5.2)
Sodium: 142 mmol/L (ref 134–144)

## 2015-09-17 ENCOUNTER — Encounter: Payer: Self-pay | Admitting: Cardiovascular Disease

## 2015-09-17 ENCOUNTER — Ambulatory Visit (INDEPENDENT_AMBULATORY_CARE_PROVIDER_SITE_OTHER): Payer: Medicare Other | Admitting: Cardiovascular Disease

## 2015-09-17 VITALS — BP 168/90 | HR 53 | Ht 69.0 in | Wt 111.1 lb

## 2015-09-17 DIAGNOSIS — Z720 Tobacco use: Secondary | ICD-10-CM

## 2015-09-17 DIAGNOSIS — E785 Hyperlipidemia, unspecified: Secondary | ICD-10-CM

## 2015-09-17 DIAGNOSIS — I739 Peripheral vascular disease, unspecified: Secondary | ICD-10-CM

## 2015-09-17 NOTE — Assessment & Plan Note (Signed)
The patient's symptoms on the left side are suggestive of peripheral neuropathy. He has no convincing evidence of claudication. He does have borderline significant iliac disease bilaterally. However, he has no claudication. Thus, I recommend continuing medical therapy. He can return to see me if he develops symptoms related to peripheral arterial disease.

## 2015-09-17 NOTE — Assessment & Plan Note (Signed)
Lab Results  Component Value Date   CHOL 121 10/23/2014   HDL 46.90 10/23/2014   LDLCALC 65 10/23/2014   TRIG 48.0 10/23/2014   CHOLHDL 3 10/23/2014   LDL was at target. Continue treatment with atorvastatin.

## 2015-09-17 NOTE — Assessment & Plan Note (Signed)
Unfortunately, he continues to smoke. I had a prolonged discussion with him about the importance of smoking cessation.
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
## 2015-09-17 NOTE — Patient Instructions (Signed)
Your physician recommends that you continue on your current medications as directed. Please refer to the Current Medication list given to you today.  Your physician recommends that you schedule a follow-up appointment as needed with Dr. Arida  

## 2015-09-22 LAB — PRESCRIPTION ABUSE MONITORING 17P, URINE
6-Acetylmorphine, Urine: NEGATIVE ng/mL
Amphetamine Scrn, Ur: NEGATIVE ng/mL
BARBITURATE SCREEN URINE: NEGATIVE ng/mL
BENZODIAZEPINE SCREEN, URINE: NEGATIVE ng/mL
CANNABINOIDS UR QL SCN: NEGATIVE ng/mL
Carisoprodol/Meprobamate, Ur: NEGATIVE ng/mL
Cocaine (Metab) Scrn, Ur: NEGATIVE ng/mL
Creatinine(Crt), U: 60.6 mg/dL (ref 20.0–300.0)
EDDP, Urine: NEGATIVE ng/mL
Fentanyl, Urine: NEGATIVE pg/mL
MDMA Screen, Urine: NEGATIVE ng/mL
Meperidine Screen, Urine: NEGATIVE ng/mL
Methadone Screen, Urine: NEGATIVE ng/mL
Nitrite Urine, Quantitative: NEGATIVE ug/mL
OXYCODONE+OXYMORPHONE UR QL SCN: NEGATIVE ng/mL
Opiate Scrn, Ur: NEGATIVE ng/mL
Ph of Urine: 5.8 (ref 4.5–8.9)
Phencyclidine Qn, Ur: NEGATIVE ng/mL
Propoxyphene Scrn, Ur: NEGATIVE ng/mL
SPECIFIC GRAVITY: 1.006
Tapentadol, Urine: NEGATIVE ng/mL
Tramadol Screen, Urine: NEGATIVE ng/mL

## 2015-09-22 LAB — BUPRENORPHINE CONFIRM, URINE
Buprenorphine Confirm: 34 ng/mL
Buprenorphine: POSITIVE — AB
Buprenorphine: POSITIVE — AB
Norbuprenorphine Confirm: 112 ng/mL
Norbuprenorphine: POSITIVE — AB

## 2015-09-30 ENCOUNTER — Other Ambulatory Visit: Payer: Self-pay | Admitting: Nurse Practitioner

## 2015-11-01 ENCOUNTER — Other Ambulatory Visit: Payer: Self-pay | Admitting: Internal Medicine

## 2015-11-01 DIAGNOSIS — E785 Hyperlipidemia, unspecified: Secondary | ICD-10-CM

## 2015-11-01 MED ORDER — ATORVASTATIN CALCIUM 20 MG PO TABS: 20.0000 mg | ORAL_TABLET | Freq: Every day | ORAL | Status: DC

## 2015-11-01 NOTE — Telephone Encounter (Signed)
Patient requesting a refill on Lipitor from [facility] on [address]

## 2015-11-12 NOTE — Progress Notes (Signed)
UDS  Buprenorphine and Norbuprenorphine positive.  This is an unexpected result and will be discussed with the patient before further narcotics are prescribed.  Vitamin B12 303  Will follow-up with a methylmalonic acid level to confirm if this is a clinically significant level.  BMP Cr 1.39 eGFR 57  Renal function stable over past year.

## 2015-12-02 ENCOUNTER — Encounter: Payer: Self-pay | Admitting: Internal Medicine

## 2015-12-06 ENCOUNTER — Ambulatory Visit (INDEPENDENT_AMBULATORY_CARE_PROVIDER_SITE_OTHER): Payer: Medicare Other | Admitting: Internal Medicine

## 2015-12-06 ENCOUNTER — Encounter: Payer: Self-pay | Admitting: Internal Medicine

## 2015-12-06 VITALS — BP 161/69 | HR 63 | Temp 98.4°F | Wt 112.4 lb

## 2015-12-06 DIAGNOSIS — I739 Peripheral vascular disease, unspecified: Secondary | ICD-10-CM | POA: Diagnosis not present

## 2015-12-06 DIAGNOSIS — I4891 Unspecified atrial fibrillation: Secondary | ICD-10-CM

## 2015-12-06 DIAGNOSIS — D126 Benign neoplasm of colon, unspecified: Secondary | ICD-10-CM

## 2015-12-06 DIAGNOSIS — E538 Deficiency of other specified B group vitamins: Secondary | ICD-10-CM

## 2015-12-06 DIAGNOSIS — I10 Essential (primary) hypertension: Secondary | ICD-10-CM

## 2015-12-06 DIAGNOSIS — E785 Hyperlipidemia, unspecified: Secondary | ICD-10-CM

## 2015-12-06 DIAGNOSIS — Z720 Tobacco use: Secondary | ICD-10-CM

## 2015-12-06 DIAGNOSIS — M47815 Spondylosis without myelopathy or radiculopathy, thoracolumbar region: Secondary | ICD-10-CM

## 2015-12-06 DIAGNOSIS — M47819 Spondylosis without myelopathy or radiculopathy, site unspecified: Secondary | ICD-10-CM

## 2015-12-06 DIAGNOSIS — F17200 Nicotine dependence, unspecified, uncomplicated: Secondary | ICD-10-CM

## 2015-12-06 DIAGNOSIS — Z9889 Other specified postprocedural states: Secondary | ICD-10-CM | POA: Diagnosis not present

## 2015-12-06 DIAGNOSIS — Z79899 Other long term (current) drug therapy: Secondary | ICD-10-CM

## 2015-12-06 MED ORDER — DRONEDARONE HCL 400 MG PO TABS: 400.0000 mg | ORAL_TABLET | Freq: Two times a day (BID) | ORAL | Status: DC

## 2015-12-06 NOTE — Progress Notes (Signed)
   Subjective:    Patient ID: Daniel [name], male    DOB: 03/24/1941, 74 y.o.   MRN: [id number]  HPI  Daniel [name] is here for follow-up of atrial fibrillation, tobacco abuse, peripheral artery disease, and unexpected result on recent UDS. Please see the A&P for the status of the pt's chronic medical problems.  Review of Systems  Constitutional: Negative for activity change, appetite change and unexpected weight change.  Respiratory: Negative for chest tightness, shortness of breath and wheezing.   Cardiovascular: Negative for chest pain, palpitations and leg swelling.  Gastrointestinal: Negative for nausea, vomiting, abdominal pain, diarrhea and constipation.  Musculoskeletal: Positive for back pain and arthralgias. Negative for joint swelling.  Neurological: Negative for syncope and weakness.      Objective:   Physical Exam  Constitutional: He is oriented to person, place, and time. He appears well-developed and well-nourished. No distress.  HENT:  Head: Normocephalic and atraumatic.  Eyes: Conjunctivae are normal. Right eye exhibits no discharge. Left eye exhibits no discharge. No scleral icterus.  Musculoskeletal: Normal range of motion. He exhibits no edema or tenderness.  Neurological: He is alert and oriented to person, place, and time. He exhibits normal muscle tone.  Skin: He is not diaphoretic.  Psychiatric: He has a normal mood and affect. His behavior is normal. Judgment and thought content normal.  Nursing note and vitals reviewed.     Assessment & Plan:   Please see problem oriented charting.
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
## 2015-12-06 NOTE — Assessment & Plan Note (Signed)
Assessment  He has symptoms of pins and needles in his foot which I believe may be secondary to a neuropathy. Specifically, I am concerned this is a vitamin B12 deficiency. At the last clinic visit his vitamin D12 level was 303 which is on the low end of normal. Therefore he still may technically have a vitamin B12 deficiency.  Plan  We have checked a methylmalonic acid level today and is pending at the time of this dictation. If it is elevated, that will confirm our suspicion that he is clinically vitamin B12 deficient. If this were to be the case we will start oral vitamin B12 replacement and reassess his levels in the future.

## 2015-12-06 NOTE — Assessment & Plan Note (Signed)
Assessment  When he is seen in clinic his systolic blood pressures are consistently in the 160s. At home he states they're in the 130s. It is unclear if this is a representation of whitecoat hypertension or a home blood pressure cuff that is not a properly calibrated.  Plan  I asked him to bring his blood pressure cuff in to assess for calibration compared to hours at the next visit. In the meantime we will continue the enalapril 20 mg by mouth twice daily and Toprol-XL 75 mg by mouth daily. We will reassess his blood pressure at the follow-up visit.

## 2015-12-06 NOTE — Assessment & Plan Note (Signed)
Assessment  He remains asymptomatic with regards to his atrial fibrillation status post cardioversion. This is well taking dronedarone, Pradaxa, and Toprol-XL 75 mg by mouth daily should he flip back into atrial fibrillation.  Plan  We will continue the dronedarone, Pradaxa, and Toprol-XL and reassess for symptoms at the follow-up visit.

## 2015-12-06 NOTE — Assessment & Plan Note (Signed)
He remains uninterested in a tetanus booster or Pneumovax. We will reassess for changes in his mind at the follow-up visit on these preventative health maintenance issues.

## 2015-12-06 NOTE — Assessment & Plan Note (Signed)
Assessment  He was recently seen by his vascular specialist. At that visit he noted no claudication. Therefore risk factor modification was recommended.  Plan  We had a discussion about the importance of tobacco cessation for which he is not interested at this time. We will continue to work on risk factor modification including blood pressure control as well as antilipid therapy. We will reassess for evidence of claudication at the follow-up visit.

## 2015-12-06 NOTE — Assessment & Plan Note (Signed)
Assessment  He is due for a repeat surveillance colonoscopy but remains uninterested in having the procedure.  Plan  I will reassess if he changes his mind and may be willing to undergo a surveillance colonoscopy at the follow-up visit. Of note, he gets angry each time this topic is discussed.

## 2015-12-06 NOTE — Patient Instructions (Signed)
It was good to see you again.  1) Keep taking the medications as you are.  I refilled the multaq.  2) I am checking another blood test as I think the vitamin B12 is causing your pins and needles feeling in your foot.  The vitamin B12 level was normal, but it may not be enough.  I will see you in 6 months, sooner if necessary.

## 2015-12-06 NOTE — Assessment & Plan Note (Signed)
Assessment  He remains uninterested in tobacco cessation.  Plan  We will reassess if he has changed his mind on the issue of smoking cessation at the follow-up visit. Of note, he gets angry each time this topic is brought up.

## 2015-12-06 NOTE — Assessment & Plan Note (Signed)
Assessment  He is tolerating the atorvastatin 20 mg by mouth daily without myalgias.  Plan  We will continue the atorvastatin 20 mg by mouth daily and reassess for intolerances at the follow-up visit.

## 2015-12-11 LAB — METHYLMALONIC ACID, SERUM: Methylmalonic Acid: 539 nmol/L — ABNORMAL HIGH (ref 0–378)

## 2015-12-12 MED ORDER — VITAMIN B-12 1000 MCG PO TABS: 1000.0000 ug | ORAL_TABLET | Freq: Every day | ORAL | Status: DC

## 2015-12-12 NOTE — Progress Notes (Addendum)
Methylmalonic acid: 539  This result is high suggesting that the vitamin B12 is clinically deficient.  I will therefore first try oral replacement and prescribe Vitamin B12 1000 mcg PO daily and reassess vitamin B12 and methylmalonic acid levels at follow-up.  I called Daniel [name] but received an unidentified answering machine.  I will try calling him with the results after the holidays.

## 2015-12-12 NOTE — Addendum Note (Signed)
Addended by: [name] D on: 12/12/2015 04:29 PM   Modules accepted: Orders

## 2015-12-19 NOTE — Progress Notes (Signed)
I reached him via the phone this AM and I discussed the methylmalonic acid result, what it means, and my recommendation to start oral vitamin B12.  He states he will pick it up OTC in the very near future.  Fortunately, the stinging in his feet have resolved for the time being.  Will follow-up with a vitamin B12 and MMA level at the follow-up visit.

## 2016-02-27 ENCOUNTER — Encounter: Payer: Self-pay | Admitting: *Deleted

## 2016-04-17 ENCOUNTER — Other Ambulatory Visit: Payer: Self-pay | Admitting: Internal Medicine

## 2016-04-17 DIAGNOSIS — I10 Essential (primary) hypertension: Secondary | ICD-10-CM

## 2016-04-27 ENCOUNTER — Other Ambulatory Visit: Payer: Self-pay | Admitting: Cardiology

## 2016-04-27 DIAGNOSIS — I700 Atherosclerosis of aorta: Secondary | ICD-10-CM

## 2016-04-27 DIAGNOSIS — I739 Peripheral vascular disease, unspecified: Secondary | ICD-10-CM

## 2016-04-27 DIAGNOSIS — I6523 Occlusion and stenosis of bilateral carotid arteries: Secondary | ICD-10-CM

## 2016-05-04 ENCOUNTER — Ambulatory Visit (HOSPITAL_COMMUNITY)
Admission: RE | Admit: 2016-05-04 | Discharge: 2016-05-04 | Disposition: A | Discharge status: Home / Self Care | Payer: Medicare Other | Source: Ambulatory Visit | Attending: Cardiology | Admitting: Cardiology

## 2016-05-04 DIAGNOSIS — Z720 Tobacco use: Secondary | ICD-10-CM | POA: Diagnosis not present

## 2016-05-04 DIAGNOSIS — I10 Essential (primary) hypertension: Secondary | ICD-10-CM | POA: Insufficient documentation

## 2016-05-04 DIAGNOSIS — E785 Hyperlipidemia, unspecified: Secondary | ICD-10-CM | POA: Insufficient documentation

## 2016-05-04 DIAGNOSIS — I739 Peripheral vascular disease, unspecified: Secondary | ICD-10-CM | POA: Diagnosis not present

## 2016-05-04 DIAGNOSIS — I6523 Occlusion and stenosis of bilateral carotid arteries: Secondary | ICD-10-CM | POA: Diagnosis not present

## 2016-05-04 DIAGNOSIS — I700 Atherosclerosis of aorta: Secondary | ICD-10-CM | POA: Diagnosis not present

## 2016-05-04 DIAGNOSIS — I708 Atherosclerosis of other arteries: Secondary | ICD-10-CM | POA: Diagnosis not present

## 2016-05-04 DIAGNOSIS — R938 Abnormal findings on diagnostic imaging of other specified body structures: Secondary | ICD-10-CM | POA: Insufficient documentation

## 2016-05-28 ENCOUNTER — Encounter: Payer: Medicare Other | Admitting: Internal Medicine

## 2016-05-28 ENCOUNTER — Telehealth: Payer: Self-pay | Admitting: Pharmacist

## 2016-07-24 NOTE — Telephone Encounter (Signed)
Tried contacting patient for DOAC monitoring, unable to reach x 3. Recommend considering CBC, CMP/LFT. Will continue to try to reach patient.

## 2016-09-03 ENCOUNTER — Telehealth: Payer: Self-pay | Admitting: Internal Medicine

## 2016-09-03 NOTE — Telephone Encounter (Signed)
APT. REMINDER CALL, NO ANSWER, NO VOICEMAIL °

## 2016-09-04 ENCOUNTER — Ambulatory Visit (INDEPENDENT_AMBULATORY_CARE_PROVIDER_SITE_OTHER): Payer: Medicare Other | Admitting: Internal Medicine

## 2016-09-04 ENCOUNTER — Encounter: Payer: Self-pay | Admitting: Internal Medicine

## 2016-09-04 VITALS — BP 159/71 | HR 59 | Temp 98.2°F | Wt 104.9 lb

## 2016-09-04 DIAGNOSIS — M47815 Spondylosis without myelopathy or radiculopathy, thoracolumbar region: Secondary | ICD-10-CM

## 2016-09-04 DIAGNOSIS — I10 Essential (primary) hypertension: Secondary | ICD-10-CM | POA: Diagnosis not present

## 2016-09-04 DIAGNOSIS — F17210 Nicotine dependence, cigarettes, uncomplicated: Secondary | ICD-10-CM | POA: Diagnosis not present

## 2016-09-04 DIAGNOSIS — M47819 Spondylosis without myelopathy or radiculopathy, site unspecified: Secondary | ICD-10-CM

## 2016-09-04 DIAGNOSIS — Z79899 Other long term (current) drug therapy: Secondary | ICD-10-CM

## 2016-09-04 DIAGNOSIS — E538 Deficiency of other specified B group vitamins: Secondary | ICD-10-CM

## 2016-09-04 DIAGNOSIS — Z0000 Encounter for general adult medical examination without abnormal findings: Secondary | ICD-10-CM

## 2016-09-04 DIAGNOSIS — I4891 Unspecified atrial fibrillation: Secondary | ICD-10-CM

## 2016-09-04 MED ORDER — TRAMADOL HCL 50 MG PO TABS: 50.0000 mg | ORAL_TABLET | Freq: Four times a day (QID) | ORAL | 5 refills | Status: DC | PRN

## 2016-09-04 NOTE — Progress Notes (Signed)
   Subjective:    Patient ID: Daniel [name], male    DOB: 1941/05/28, 75 y.o.   MRN: [id number]  HPI  Daniel [name] is here for follow-up of blood pressure, vitamin B12 deficiency, and chronic pain. Please see the A&P for the status of the pt's chronic medical problems.  Review of Systems  Constitutional: Positive for activity change.       Decreased activity because of low back, left shoulder, and right knee pain that is uncontrolled  Eyes: Positive for redness.  Respiratory: Negative for chest tightness and shortness of breath.   Cardiovascular: Negative for chest pain and leg swelling.  Musculoskeletal: Positive for arthralgias, back pain and myalgias. Negative for joint swelling.  Skin: Negative for rash and wound.      Objective:   Physical Exam  Constitutional: He is oriented to person, place, and time. He appears well-developed and well-nourished. No distress.  HENT:  Head: Normocephalic and atraumatic.  Eyes: Right eye exhibits no discharge. Left eye exhibits no discharge. No scleral icterus.  Right subconjunctival hemorrhage  Neck: Normal range of motion. Neck supple.  Pulmonary/Chest: No respiratory distress.  Musculoskeletal: Normal range of motion. He exhibits tenderness. He exhibits no edema or deformity.  Tenderness to palpation of the left scapula and bilateral paraspinal lower back  Neurological: He is alert and oriented to person, place, and time. He exhibits normal muscle tone.  Skin: Skin is warm and dry. No rash noted. He is not diaphoretic. No erythema.  Psychiatric: He has a normal mood and affect. His behavior is normal. Judgment and thought content normal.  Nursing note and vitals reviewed.     Assessment & Plan:   Please see problem oriented charting.

## 2016-09-04 NOTE — Assessment & Plan Note (Signed)
Assessment  Mr. 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 His pain exacerbated when he was no longer prescribed hydrocodone after the last visit when an inappropriate UDS was discussed. At that time I had asked him for a repeat UDS and he refused to provide a sample, and said forget about narcotic prescriptions from the [facility]. I obliged that request. He subsequently has had significant pain that limits his ability to move about the house and enjoy life in general. He now regrets not providing a urine sample and states it was a mistake. I told him that that his failure to provide a urine sample upon request was a pain contract violation, and that he would no longer get narcotics from our clinic. He is now asking for reconsideration.  Plan  I have little doubt Mr. Helmes is suffering from his degenerative joint disease. I discussed the seriousness of the pain contract violation and the fact that we take such contracts very seriously. I am willing to provide him with tramadol 50 mg by mouth every 6 hours as needed for pain #120. He was given a written prescription for this with 5 refills. We've re-sign the pain contract and I made it very clear to him this would be his very last opportunity to receive controlled substances from our clinic. He stated he clearly understood the terms of the contract and will provide a urine sample whenever asked. He also understands that failure to follow the contract will lead to no further controlled pain medications from the clinic. I did tell him I was not going to prescribe him Narco or Percocet for his pain and the tramadol was all that he was to receive. A urine sample was again obtained today and he hinted to me that he had taken one of his mother's tramadol tablets so it will be interesting to see if he has  another inappropriate UDS.

## 2016-09-04 NOTE — Assessment & Plan Note (Signed)
Assessment  His blood pressure again remains elevated today at 159/71. He states that at home on his wrist blood pressure machine that he got from [facility] his blood pressures are in the 130s/80s. At the last clinic visit I asked him to bring in his cuff so that we could calibrate it to ours. He again failed to do so. I also received a note from [facility] stating that they were concerned that he may not be compliant with his enalapril given the refill history. When I asked him whether or not he was taking his enalapril on a regular basis and as prescribed he was adamant that he was.  Plan  I again stated the importance of him bringing his blood pressure machine to the clinic at the next visit so we could calibrate it to assure that the readings he was getting at home were accurate. In the meantime we will continue with his current blood pressure regimen of enalapril 20 mg by mouth twice daily and Toprol-XL 75 mg by mouth daily.

## 2016-09-04 NOTE — Assessment & Plan Note (Signed)
Assessment  He no longer has pins and needles sensation in his bilateral lower extremities. That being said, he states he stopped taking the vitamin B12.  Plan  He was encouraged to restart the vitamin B12 orally. We will obtain a vitamin B12 level and MMA level today. I anticipate these will both be abnormal. If he continues to take his vitamin B-12 over the interim into the next clinic visit we will repeat these levels to see if he is, in fact, replenishing his vitamin B12 stores orally.

## 2016-09-04 NOTE — Patient Instructions (Signed)
We spent the visit talking about your pain medications.  1) Talk all of your medications as you are.  2) I started tramadol 50 mg by mouth every 6 hours as needed for pain #120/month.  A pain contract was signed.  This is your last chance.  One more pain clinic violation and no more pain medications will be given to you at the [facility].  3) I will call you with the results of you blood work if anything is concerning.  4) I will see you in 5 months, sooner if necessary.

## 2016-09-04 NOTE — Assessment & Plan Note (Signed)
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 He also is not interested in a flu vaccination.

## 2016-09-04 NOTE — Assessment & Plan Note (Signed)
Assessment  He has had no palpitations and examination reveals a regular rhythm suggest he remains in normal sinus rhythm. He claims to be compliant with his dronederone 400 mg twice daily, dabigatran 75 mg twice daily and Toprol-XL 75 mg daily.  Plan  We will continue the dronedarone,dabigatran, and Toprol-XL at the current doses. We will reassess for cardiac symptoms at the follow-up visit.

## 2016-09-07 ENCOUNTER — Encounter: Payer: Medicare Other | Admitting: Internal Medicine

## 2016-09-08 NOTE — Progress Notes (Signed)
Patient ID: Daniel [name], male   DOB: Sep 29, 1941, 75 y.o.   MRN: [id number]  BMP: K 5.2, Cr 1.19, Ca 10.7, eGFR 69  Cause of hypercalcemia unclear.  We will reassess at the next visit to see if it remains elevated.  If so, he will require an evaluation to include review of any supplements and a PTH level.  CBC: Unremarkable  Vitamin B12 386 MMA Pending  Not a surprise as he stopped taking the vitamin B12 supplementation, thus unable to assess if he was responding to it.  He was encouraged to take it during the visit, so even if the MMA is elevated, which I suspect it will be, the plan to address it is already in place.

## 2016-09-09 LAB — CBC
Hematocrit: 37.8 % (ref 37.5–51.0)
Hemoglobin: 12.5 g/dL — ABNORMAL LOW (ref 12.6–17.7)
MCH: 32.1 pg (ref 26.6–33.0)
MCHC: 33.1 g/dL (ref 31.5–35.7)
MCV: 97 fL (ref 79–97)
Platelets: 226 10*3/uL (ref 150–379)
RBC: 3.89 x10E6/uL — ABNORMAL LOW (ref 4.14–5.80)
RDW: 14 % (ref 12.3–15.4)
WBC: 6.4 10*3/uL (ref 3.4–10.8)

## 2016-09-09 LAB — BMP8+ANION GAP
Anion Gap: 20 mmol/L — ABNORMAL HIGH (ref 10.0–18.0)
BUN/Creatinine Ratio: 6 — ABNORMAL LOW (ref 10–24)
BUN: 7 mg/dL — ABNORMAL LOW (ref 8–27)
CO2: 20 mmol/L (ref 18–29)
Calcium: 10.7 mg/dL — ABNORMAL HIGH (ref 8.6–10.2)
Chloride: 101 mmol/L (ref 96–106)
Creatinine, Ser: 1.19 mg/dL (ref 0.76–1.27)
GFR calc Af Amer: 69 mL/min/{1.73_m2} (ref >59)
GFR calc non Af Amer: 59 mL/min/{1.73_m2} — ABNORMAL LOW (ref >59)
Glucose: 95 mg/dL (ref 65–99)
Potassium: 5.2 mmol/L (ref 3.5–5.2)
Sodium: 141 mmol/L (ref 134–144)

## 2016-09-09 LAB — VITAMIN B12: Vitamin B-12: 386 pg/mL (ref 211–946)

## 2016-09-09 LAB — METHYLMALONIC ACID, SERUM: Methylmalonic Acid: 355 nmol/L (ref 0–378)

## 2016-09-10 NOTE — Progress Notes (Signed)
Patient ID: Daniel [name], male   DOB: 09-Jan-1941, 75 y.o.   MRN: [id number]  MMA 355 which is the high end of normal.  We will continue with the oral vitamin B12 supplementation.

## 2016-09-12 LAB — TOXASSURE SELECT,+ANTIDEPR,UR

## 2016-09-13 ENCOUNTER — Other Ambulatory Visit: Payer: Self-pay | Admitting: Internal Medicine

## 2016-09-13 DIAGNOSIS — I4891 Unspecified atrial fibrillation: Secondary | ICD-10-CM

## 2016-09-14 ENCOUNTER — Other Ambulatory Visit: Payer: Self-pay | Admitting: Internal Medicine

## 2016-09-14 DIAGNOSIS — E785 Hyperlipidemia, unspecified: Secondary | ICD-10-CM

## 2016-09-14 NOTE — Progress Notes (Signed)
Patient ID: Daniel [name], male   DOB: [birth year], 75 y.o.   MRN: [id number]  UDS was positive for Buprenorphine and Tramadol.  Daniel [name] admitted to taking his mother's pain medication at the visit.  We will be very vigilant with random UDS, etc.  If any violation of the pain contract the tramadol will be discontinued.  Daniel [name] was informed of this and states he understands.

## 2016-10-05 ENCOUNTER — Other Ambulatory Visit: Payer: Self-pay | Admitting: Nurse Practitioner

## 2016-10-27 ENCOUNTER — Telehealth: Payer: Self-pay

## 2016-10-27 NOTE — Progress Notes (Signed)
Daniel [name] is a 75 y.o. male who was contacted via telephone for monitoring of dabigatran (Pradaxa) therapy.    ASSESSMENT Indication(s): atrial fibrillation  Duration: indefinite  Labs:    Component Value Date/Time   AST 34 10/23/2014 1404   ALT 20 10/23/2014 1404   NA 141 09/04/2016 1012   K 5.2 09/04/2016 1012   CL 101 09/04/2016 1012   CO2 20 09/04/2016 1012   GLUCOSE 95 09/04/2016 1012   GLUCOSE 83 04/29/2015 1557   HGBA1C (H) 05/26/2010 1700    6.0 (NOTE)                                                                       According to the ADA Clinical Practice Recommendations for 2011, when HbA1c is used as a screening test:   >=6.5%   Diagnostic of Diabetes Mellitus           (if abnormal result  is confirmed)  5.7-6.4%   Increased risk of developing Diabetes Mellitus  References:Diagnosis and Classification of Diabetes Mellitus,Diabetes [id number] 1):S62-S69 and Standards of Medical Care in         Diabetes - 2011,Diabetes Care,2011,34  (Suppl 1):S11-S61.   BUN 7 (L) 09/04/2016 1012   CREATININE 1.19 09/04/2016 1012   CREATININE 1.27 03/03/2013 1100   CALCIUM 10.7 (H) 09/04/2016 1012   GFRAA 69 09/04/2016 1012   GFRAA 65 03/03/2013 1100   WBC 6.4 09/04/2016 1012   WBC 6.6 10/23/2014 1404   HGB 13.5 10/23/2014 1404   HCT 37.8 09/04/2016 1012   PLT 226 09/04/2016 1012   dabigatran (Pradaxa) Dose: 75 mg BID  Safety: Patient has not had recent bleeding/thromboembolic events. Patient reports no recent signs or symptoms of bleeding, no signs of symptoms of thromboembolism.   Adherence: Patient does correctly recite the dose. Patient reports no known adherence challenges. Contacted pharmacy and records indicate refills are consistent.  Patient Instructions: Patient advised to contact clinic or seek medical attention if signs/symptoms of bleeding or thromboembolism occur. Patient verbalized understanding by repeating back  information.  Follow-up 02/05/17  Emely Fahy J Clinical Pharmacist  10/27/2016, 11:14 AM

## 2016-12-10 ENCOUNTER — Other Ambulatory Visit: Payer: Self-pay | Admitting: Internal Medicine

## 2016-12-10 DIAGNOSIS — I4891 Unspecified atrial fibrillation: Secondary | ICD-10-CM

## 2017-02-05 ENCOUNTER — Ambulatory Visit: Payer: Medicare Other | Admitting: Internal Medicine

## 2017-02-05 ENCOUNTER — Encounter: Payer: Self-pay | Admitting: Internal Medicine

## 2017-05-25 ENCOUNTER — Other Ambulatory Visit: Payer: Self-pay | Admitting: Internal Medicine

## 2017-05-25 DIAGNOSIS — M47815 Spondylosis without myelopathy or radiculopathy, thoracolumbar region: Secondary | ICD-10-CM

## 2017-08-12 ENCOUNTER — Other Ambulatory Visit: Payer: Self-pay | Admitting: Internal Medicine

## 2017-08-12 DIAGNOSIS — I10 Essential (primary) hypertension: Secondary | ICD-10-CM

## 2017-09-16 ENCOUNTER — Other Ambulatory Visit: Payer: Self-pay | Admitting: Internal Medicine

## 2017-09-16 DIAGNOSIS — I4891 Unspecified atrial fibrillation: Secondary | ICD-10-CM

## 2017-09-18 ENCOUNTER — Other Ambulatory Visit: Payer: Self-pay | Admitting: Internal Medicine

## 2017-09-18 DIAGNOSIS — I4891 Unspecified atrial fibrillation: Secondary | ICD-10-CM

## 2017-10-01 ENCOUNTER — Encounter: Payer: Medicare Other | Admitting: Internal Medicine

## 2017-10-21 ENCOUNTER — Other Ambulatory Visit: Payer: Self-pay | Admitting: Internal Medicine

## 2017-10-21 DIAGNOSIS — I4891 Unspecified atrial fibrillation: Secondary | ICD-10-CM

## 2017-10-22 NOTE — Telephone Encounter (Signed)
No showed recent appointment and subsequently cancelled another appointment.  Scheduled for next week.  Will give a one month supply but will no longer fill the medication if he fails to present for reassessment to assure it remains appropriate for him.

## 2017-10-25 ENCOUNTER — Other Ambulatory Visit: Payer: Self-pay | Admitting: Nurse Practitioner

## 2017-10-29 ENCOUNTER — Encounter: Payer: Self-pay | Admitting: Internal Medicine

## 2017-10-29 ENCOUNTER — Other Ambulatory Visit: Payer: Self-pay

## 2017-10-29 ENCOUNTER — Ambulatory Visit (INDEPENDENT_AMBULATORY_CARE_PROVIDER_SITE_OTHER): Payer: Medicare Other | Admitting: Internal Medicine

## 2017-10-29 VITALS — BP 140/61 | HR 57 | Temp 97.9°F | Wt 102.8 lb

## 2017-10-29 DIAGNOSIS — E7800 Pure hypercholesterolemia, unspecified: Secondary | ICD-10-CM | POA: Diagnosis not present

## 2017-10-29 DIAGNOSIS — M47815 Spondylosis without myelopathy or radiculopathy, thoracolumbar region: Secondary | ICD-10-CM | POA: Diagnosis not present

## 2017-10-29 DIAGNOSIS — Z720 Tobacco use: Secondary | ICD-10-CM | POA: Diagnosis not present

## 2017-10-29 DIAGNOSIS — I10 Essential (primary) hypertension: Secondary | ICD-10-CM

## 2017-10-29 DIAGNOSIS — F1210 Cannabis abuse, uncomplicated: Secondary | ICD-10-CM | POA: Insufficient documentation

## 2017-10-29 DIAGNOSIS — E538 Deficiency of other specified B group vitamins: Secondary | ICD-10-CM

## 2017-10-29 DIAGNOSIS — I4891 Unspecified atrial fibrillation: Secondary | ICD-10-CM

## 2017-10-29 DIAGNOSIS — I739 Peripheral vascular disease, unspecified: Secondary | ICD-10-CM

## 2017-10-29 HISTORY — DX: Cannabis abuse, uncomplicated: F12.10

## 2017-10-29 MED ORDER — DRONEDARONE HCL 400 MG PO TABS: 400.0000 mg | ORAL_TABLET | Freq: Two times a day (BID) | ORAL | 3 refills | Status: DC

## 2017-10-29 NOTE — Assessment & Plan Note (Signed)
Assessment  He denies any claudication.  Plan  We will continue risk factor modification including management of his hypertension and hyperlipidemia. We have stressed the importance of smoking cessation and he remains in the pre-contemplative stage.

## 2017-10-29 NOTE — Assessment & Plan Note (Signed)
Assessment  He is tolerating the atorvastatin 20 mg by mouth daily without myalgias.  Plan  We will continue this moderate to high intensity statin and reassess for intolerances at the follow-up visit.

## 2017-10-29 NOTE — Assessment & Plan Note (Signed)
He remains uninterested in preventative health care measures including the flu vaccination. We will reassess whether or not he has changed his mind in this regard at the follow-up visit.

## 2017-10-29 NOTE — Assessment & Plan Note (Signed)
Assessment  His pain is well controlled on as needed ibuprofen 200-400 mg by mouth. He asked that I remove the tramadol from his medication list as he no longer requires it.  Plan  We will continue the as needed ibuprofen and reassess efficacy of this therapy in managing his pain at the follow-up visit.

## 2017-10-29 NOTE — Progress Notes (Signed)
   Subjective:    Patient ID: Daniel [name], male    DOB: [birth year], 76 y.o.   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 Please see the A&P for the status of the pt's chronic medical problems.  Review of Systems  Constitutional: Positive for appetite change. Negative for activity change and unexpected weight change.       Improved appetite since he began to smoke marijuana  Respiratory: Negative for chest tightness, shortness of breath and wheezing.   Cardiovascular: Negative for chest pain, palpitations and leg swelling.  Gastrointestinal: Negative for abdominal distention, abdominal pain, constipation, diarrhea, nausea and vomiting.  Musculoskeletal: Positive for back pain. Negative for gait problem, joint swelling and myalgias.  Skin: Negative for rash and wound.  Neurological: Negative for dizziness, syncope and light-headedness.      Objective:   Physical Exam  Constitutional: He is oriented to person, place, and time. He appears well-developed. No distress.  Thin  HENT:  Head: Normocephalic and atraumatic.  Eyes: Conjunctivae are normal. Right eye exhibits no discharge. Left eye exhibits no discharge. No scleral icterus.  Cardiovascular: Normal rate, regular rhythm and normal heart sounds. Exam reveals no gallop and no friction rub.  No murmur heard. Pulmonary/Chest: Effort normal and breath sounds normal. No respiratory distress. He has no wheezes. He has no rales.  Abdominal: Soft. Bowel sounds are normal. He exhibits no distension. There is no tenderness. There is no rebound and no guarding.  Musculoskeletal: Normal range of motion. He exhibits no edema, tenderness or deformity.  Neurological: He is alert and oriented to person, place, and time. He exhibits normal  muscle tone.  Skin: Skin is warm and dry. He is not diaphoretic. No erythema.  Psychiatric: He has a normal mood and affect. His behavior is normal. Judgment and thought content normal.  Nursing note and vitals reviewed.     Assessment & Plan:   Please see problem based charting.

## 2017-10-29 NOTE — Assessment & Plan Note (Signed)
Assessment  He denies any palpitations, chest pain, or dizziness. His current antiarrhythmic regimen includes multtaq 400 mg by mouth twice daily. He is on Toprol-XL 75 mg by mouth daily should he flipped back into atrial fibrillation. He also remains on a lower dose of pradaxa 75 mg by mouth twice daily given the potential interaction with the multaq.  Plan  We will continue multaq 400 mg by mouth twice daily, Toprol-XL 75 mg by mouth daily, and Pradaxa 75 mg by mouth twice daily. We will reassess for symptoms suggestive of recurrent atrial fibrillation at the follow-up visit.

## 2017-10-29 NOTE — Assessment & Plan Note (Signed)
Assessment  He denies any peripheral tingling or lack of sensation. He states he ran out of his vitamin B12 but plans on picking up another bottle today.  Plan  Since he has not been taking the vitamin B12 I did not obtain a B12 level today. I encouraged him to pick up the vitamin B12 1000 g daily. If he remains on this I will recheck the B12 level at the follow-up visit.

## 2017-10-29 NOTE — Assessment & Plan Note (Signed)
Assessment  His blood pressure today was 140/61. This is on Toprol-XL 75 mg by mouth daily and enalapril 20 mg by mouth twice daily.  Plan  We will continue the Toprol-XL 75 mg by mouth daily and enalapril 20 mg by mouth twice daily. We will reassess the efficacy of this therapy in managing his hypertension at the follow-up visit.

## 2017-10-29 NOTE — Assessment & Plan Note (Signed)
Assessment  He understands the risks associated with continued tobacco use. That being said, he remains in the pre-contemplative stage and is not mentally ready to quit at this time.  Plan  We will continue to stress the importance of tobacco cessation and tied this to his health and current chronic medical conditions. We will reassess if he is ready to quit at the follow-up visit.  He was reminded to contact me should he change his mind and want help in the interim.

## 2017-10-29 NOTE — Patient Instructions (Signed)
It was good to see you again.  1) Keep taking the medications as you are.  2) Let me know if you change your mind about shots like the flu and pneumonia.  3) We checked some labs today.  I will call you if there is anything concerning.  I will see you back in 1 year, sooner if necessary.

## 2017-10-30 LAB — CBC
Hematocrit: 32 % — ABNORMAL LOW (ref 37.5–51.0)
Hemoglobin: 10.6 g/dL — ABNORMAL LOW (ref 13.0–17.7)
MCH: 32 pg (ref 26.6–33.0)
MCHC: 33.1 g/dL (ref 31.5–35.7)
MCV: 97 fL (ref 79–97)
Platelets: 215 10*3/uL (ref 150–379)
RBC: 3.31 x10E6/uL — ABNORMAL LOW (ref 4.14–5.80)
RDW: 14 % (ref 12.3–15.4)
WBC: 6.1 10*3/uL (ref 3.4–10.8)

## 2017-10-30 LAB — BMP8+ANION GAP
Anion Gap: 12 mmol/L (ref 10.0–18.0)
BUN/Creatinine Ratio: 19 (ref 10–24)
BUN: 25 mg/dL (ref 8–27)
CO2: 20 mmol/L (ref 20–29)
Calcium: 10.4 mg/dL — ABNORMAL HIGH (ref 8.6–10.2)
Chloride: 98 mmol/L (ref 96–106)
Creatinine, Ser: 1.3 mg/dL — ABNORMAL HIGH (ref 0.76–1.27)
GFR calc Af Amer: 61 mL/min/{1.73_m2} (ref >59)
GFR calc non Af Amer: 53 mL/min/{1.73_m2} — ABNORMAL LOW (ref >59)
Glucose: 100 mg/dL — ABNORMAL HIGH (ref 65–99)
Potassium: 5.4 mmol/L — ABNORMAL HIGH (ref 3.5–5.2)
Sodium: 130 mmol/L — ABNORMAL LOW (ref 134–144)

## 2017-11-02 ENCOUNTER — Telehealth: Payer: Self-pay | Admitting: Internal Medicine

## 2017-11-02 ENCOUNTER — Other Ambulatory Visit: Payer: Self-pay | Admitting: Nurse Practitioner

## 2017-11-02 DIAGNOSIS — I4891 Unspecified atrial fibrillation: Secondary | ICD-10-CM

## 2017-11-02 NOTE — Telephone Encounter (Signed)
Pt states he's been out of metoprolol for about 4-5 days.  Pt informed that medication was previously refilled by cardiologist and to contact their office.  Per notes in pt' record-he will have to make an appt to be seen by cardiologist before they will refill metoprolol.  Pt would also like pcp to contact him with most recent lab results.  Will send info to pcp to contact pt with lab results. Please advise.Despina Hidden Cassady11/13/20181:36 PM

## 2017-11-02 NOTE — Progress Notes (Signed)
Patient ID: Daniel [name], male   DOB: 1941/08/24, 76 y.o.   MRN: [id number]  BMP: Na 130, K 5.4, BUN 25, Cr 1.30, eGFR 61, Ca 104.  I am concerned that the hyponatremia may be secondary to "tea and toast" given his weight and diet.  When questioned during the visit he was elusive on this.  The slightly elevated potassium is likely secondary to the chronic ACEI.  If it remains at this level, or higher, at follow-up I will discontinue it and convert to another antihypertensive such as amlodipine as he does not have a compelling reason to be on this medication except for his cardiovascular disease and the risks or higher potassium would begin to outweigh the benefits if the potassium level rises.  CBC: WBC 6.1, Plts 215, Hgb 10.6, Hct 32.0, MCV 97  Worsening anemia, technically not macrocytic.  I remain concerned about vitamin B12 deficiency as he had not been taking it and was advised to restart.  Some of this may also be nutritional given my concerns as noted above.  Will repeat at follow-up, hopefully after he has been on vitamin B12 for a while.

## 2017-11-02 NOTE — Telephone Encounter (Signed)
Patient has been out medicine for 4days. Patient want to see if he can get the results from his labs

## 2017-11-03 ENCOUNTER — Telehealth: Payer: Self-pay | Admitting: Nurse Practitioner

## 2017-11-03 MED ORDER — METOPROLOL SUCCINATE ER 50 MG PO TB24: 50.0000 mg | ORAL_TABLET | Freq: Every day | ORAL | 3 refills | Status: DC

## 2017-11-03 NOTE — Telephone Encounter (Signed)
I called Daniel [name] wit his lab results.  I have renew the Toprol XL but have to prescribe the 50 mg dose because the tablets can not be cut and his pulse likely would not tolerate the 100 mg dose.

## 2017-11-03 NOTE — Telephone Encounter (Signed)
Was sent in today by PCP as below.   Orion Mole  11/02/2017  Telephone  MRN:  [id number]  Description: 76 year old male Provider: [name], MD Department: [place] [facility] [place] [facility]  Reason for Call   Medication Refill    Diagnoses   Atrial fibrillation status post cardioversion [facility]) - Primary  Codes: I48.91          Call Documentation   [name], MD at 11/03/2017 11:56 AM   Status: Signed    I called Mr. Shakoor wit his lab results.  I have renew the Toprol XL but have to prescribe the 50 mg dose because the tablets can not be cut and his pulse likely would not tolerate the 100 mg dose.    Marcelino Duster, CMA at 11/02/2017 1:32 PM   Status: Signed    Pt states he's been out of metoprolol for about 4-5 days.  Pt informed that medication was previously refilled by cardiologist and to contact their office.  Per notes in pt' record-he will have to make an appt to be seen by cardiologist before they will refill metoprolol.  Pt would also like pcp to contact him with most recent lab results.  Will send info to pcp to contact pt with lab results. Please advise.Regenia Skeeter, Darlene Cassady11/13/20181:36 PM      Collier Salina S at 11/02/2017 11:59 AM   Status: Signed    Patient has been out medicine for 4days. Patient want to see if he can get the results from his labs    Encounter MyChart Messages   No messages in this encounter  Routing History   Priority Sent On From To Message Type   11/02/2017 1:37 PM Marcelino Duster, CMA [name], MD Patient Calls   11/02/2017 12:00 PM Eugenie Filler Imp Triage Nurse Pool Patient Calls  Created by   Karna Dupes on 11/02/2017 11:58 AM  Approved    Disp Refills Start End  metoprolol succinate (TOPROL-XL) 50 MG 24 hr tablet 90 tablet 3 11/03/2017   Sig - Route:  Take 1 tablet (50 mg total) daily by mouth. Note dose change from before. - Oral  Class:  Normal  DAW:  No  Authorizing Provider:   [name], MD  Visit Pharmacy   Welcome, [place]

## 2017-11-03 NOTE — Telephone Encounter (Signed)
New message     *STAT* If patient is at the pharmacy, call can be transferred to refill team.   1. Which medications need to be refilled? (please list name of each medication and dose if known) metoprolol succinate (TOPROL-XL) 50 MG 24 hr tablet  2. Which pharmacy/location (including street and city if local pharmacy) is medication to be sent to? [address]  3. Do they need a 30 day or 90 day supply?[place]

## 2017-11-17 ENCOUNTER — Other Ambulatory Visit: Payer: Self-pay | Admitting: Internal Medicine

## 2017-11-17 DIAGNOSIS — E785 Hyperlipidemia, unspecified: Secondary | ICD-10-CM

## 2017-11-22 ENCOUNTER — Ambulatory Visit (INDEPENDENT_AMBULATORY_CARE_PROVIDER_SITE_OTHER): Payer: Medicare Other | Admitting: Nurse Practitioner

## 2017-11-22 ENCOUNTER — Encounter: Payer: Self-pay | Admitting: Nurse Practitioner

## 2017-11-22 VITALS — BP 160/70 | HR 66 | Ht 69.0 in | Wt 105.0 lb

## 2017-11-22 DIAGNOSIS — I4891 Unspecified atrial fibrillation: Secondary | ICD-10-CM | POA: Diagnosis not present

## 2017-11-22 DIAGNOSIS — I480 Paroxysmal atrial fibrillation: Secondary | ICD-10-CM

## 2017-11-22 DIAGNOSIS — Z79899 Other long term (current) drug therapy: Secondary | ICD-10-CM

## 2017-11-22 MED ORDER — DABIGATRAN ETEXILATE MESYLATE 75 MG PO CAPS: 75.0000 mg | ORAL_CAPSULE | Freq: Two times a day (BID) | ORAL | 0 refills | Status: DC

## 2017-11-22 NOTE — Patient Instructions (Signed)
We will be checking the following labs today - BMET, CBC, HPF, lipids, TSH and Mg level   Medication Instructions:    Continue with your current medicines.   I sent in your refill for Pradaxa    Testing/Procedures To Be Arranged:  N/A  Follow-Up:   See me in 6 months    Other Special Instructions:   N/A    If you need a refill on your cardiac medications before your next appointment, please call your pharmacy.   Call the [place] office at [phone] if you have any questions, problems or concerns.
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
## 2017-11-23 LAB — HEPATIC FUNCTION PANEL
ALT: 9 IU/L (ref 0–44)
AST: 14 IU/L (ref 0–40)
Albumin: 4 g/dL (ref 3.5–4.8)
Alkaline Phosphatase: 77 IU/L (ref 39–117)
Bilirubin Total: 0.3 mg/dL (ref 0.0–1.2)
Bilirubin, Direct: 0.12 mg/dL (ref 0.00–0.40)
Total Protein: 6.1 g/dL (ref 6.0–8.5)

## 2017-11-23 LAB — CBC
Hematocrit: 28.9 % — ABNORMAL LOW (ref 37.5–51.0)
Hemoglobin: 9.8 g/dL — ABNORMAL LOW (ref 13.0–17.7)
MCH: 33.1 pg — ABNORMAL HIGH (ref 26.6–33.0)
MCHC: 33.9 g/dL (ref 31.5–35.7)
MCV: 98 fL — ABNORMAL HIGH (ref 79–97)
Platelets: 217 10*3/uL (ref 150–379)
RBC: 2.96 x10E6/uL — ABNORMAL LOW (ref 4.14–5.80)
RDW: 14.1 % (ref 12.3–15.4)
WBC: 6 10*3/uL (ref 3.4–10.8)

## 2017-11-23 LAB — LIPID PANEL
Chol/HDL Ratio: 1.9 ratio (ref 0.0–5.0)
Cholesterol, Total: 101 mg/dL (ref 100–199)
HDL: 52 mg/dL (ref >39)
LDL Calculated: 40 mg/dL (ref 0–99)
Triglycerides: 44 mg/dL (ref 0–149)
VLDL Cholesterol Cal: 9 mg/dL (ref 5–40)

## 2017-11-23 LAB — TSH: TSH: 1.87 u[IU]/mL (ref 0.450–4.500)

## 2017-11-23 LAB — BASIC METABOLIC PANEL
BUN/Creatinine Ratio: 12 (ref 10–24)
BUN: 16 mg/dL (ref 8–27)
CO2: 19 mmol/L — ABNORMAL LOW (ref 20–29)
Calcium: 9.9 mg/dL (ref 8.6–10.2)
Chloride: 96 mmol/L (ref 96–106)
Creatinine, Ser: 1.38 mg/dL — ABNORMAL HIGH (ref 0.76–1.27)
GFR calc Af Amer: 57 mL/min/{1.73_m2} — ABNORMAL LOW (ref >59)
GFR calc non Af Amer: 49 mL/min/{1.73_m2} — ABNORMAL LOW (ref >59)
Glucose: 80 mg/dL (ref 65–99)
Potassium: 5.3 mmol/L — ABNORMAL HIGH (ref 3.5–5.2)
Sodium: 128 mmol/L — ABNORMAL LOW (ref 134–144)

## 2017-11-23 LAB — MAGNESIUM: Magnesium: 2 mg/dL (ref 1.6–2.3)

## 2017-12-10 ENCOUNTER — Ambulatory Visit (INDEPENDENT_AMBULATORY_CARE_PROVIDER_SITE_OTHER): Payer: Medicare Other | Admitting: Internal Medicine

## 2017-12-10 ENCOUNTER — Encounter: Payer: Self-pay | Admitting: Internal Medicine

## 2017-12-10 ENCOUNTER — Other Ambulatory Visit: Payer: Self-pay

## 2017-12-10 VITALS — BP 142/60 | HR 63 | Temp 97.6°F | Wt 100.7 lb

## 2017-12-10 DIAGNOSIS — F17210 Nicotine dependence, cigarettes, uncomplicated: Secondary | ICD-10-CM

## 2017-12-10 DIAGNOSIS — E871 Hypo-osmolality and hyponatremia: Secondary | ICD-10-CM | POA: Insufficient documentation

## 2017-12-10 NOTE — Patient Instructions (Signed)
It was good to see you again.  You are doing well.  1) Keep taking the medications like you are.  2) I checked the urine and blood tests for the salt.  I will send a letter to your house with the results.  I will see you in about 10 months, sooner if necessary.

## 2017-12-10 NOTE — Assessment & Plan Note (Signed)
Assessment  Daniel [name] presents today to follow-up on hyponatremia. He was seen in the cardiology clinic recently and had a basic metabolic panel drawn at that time. His sodium was lower than baseline at 128. This concerned cardiology and they referred him back to his primary care provider for further evaluation. 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 His weight is 100 pounds today down 2 pounds from when I saw him last. The previous time his weight was at 100 pounds was in June 2015. He denies edema or excessive water intake. He states frequently he will have breakfast and then not think about eating for the rest of the day. Examination is notable for no edema. He appears clinically euvolemic.  Plan  Although clinically his hyponatremia is euvolemic hyponatremia and most consistent with "tea and toast" hyponatremia we have obtained a serum osmolality, basic metabolic panel, urine sodium, and urine osmolality to further assess. These are pending at the time of this dictation. As his phone is not working, I assured him I would send a letter to his house with the results.

## 2017-12-10 NOTE — Progress Notes (Signed)
   Subjective:    Patient ID: Daniel [name], male    DOB: Aug 04, 1941, 76 y.o.   MRN: [id number]  HPI  Daniel [name] is here for follow-up of hyponatremia which the Cardiology clinic was concerned about.Marland Kitchen Please see the A&P for the status of the pt's chronic medical problems.  Review of Systems  Constitutional: Negative for activity change, appetite change and unexpected weight change.  Respiratory: Negative for shortness of breath.   Cardiovascular: Negative for leg swelling.  Endocrine: Negative for polydipsia.      Objective:   Physical Exam  Constitutional: He is oriented to person, place, and time. He appears well-developed. No distress.  Thin, but stable.  HENT:  Head: Normocephalic and atraumatic.  Eyes: Conjunctivae are normal. Right eye exhibits no discharge. Left eye exhibits no discharge. No scleral icterus.  Musculoskeletal: Normal range of motion. He exhibits no edema, tenderness or deformity.  Neurological: He is alert and oriented to person, place, and time. He exhibits normal muscle tone.  Skin: Skin is warm and dry. No rash noted. He is not diaphoretic. No erythema.  Psychiatric: He has a normal mood and affect. His behavior is normal. Judgment and thought content normal.  Nursing note and vitals reviewed.     Assessment & Plan:   Please see problem oriented charting.

## 2017-12-11 LAB — SODIUM, URINE, RANDOM: Sodium, Ur: 31 mmol/L

## 2017-12-11 LAB — OSMOLALITY, URINE: Osmolality, Ur: 253 mOsmol/kg

## 2017-12-12 LAB — BMP8+ANION GAP
Anion Gap: 12 mmol/L (ref 10.0–18.0)
BUN/Creatinine Ratio: 11 (ref 10–24)
BUN: 15 mg/dL (ref 8–27)
CO2: 21 mmol/L (ref 20–29)
Calcium: 10.4 mg/dL — ABNORMAL HIGH (ref 8.6–10.2)
Chloride: 106 mmol/L (ref 96–106)
Creatinine, Ser: 1.37 mg/dL — ABNORMAL HIGH (ref 0.76–1.27)
GFR calc Af Amer: 58 mL/min/{1.73_m2} — ABNORMAL LOW (ref >59)
GFR calc non Af Amer: 50 mL/min/{1.73_m2} — ABNORMAL LOW (ref >59)
Glucose: 73 mg/dL (ref 65–99)
Potassium: 5 mmol/L (ref 3.5–5.2)
Sodium: 139 mmol/L (ref 134–144)

## 2017-12-12 LAB — OSMOLALITY: Osmolality Meas: 281 mOsmol/kg (ref 280–301)

## 2018-01-02 ENCOUNTER — Other Ambulatory Visit: Payer: Self-pay | Admitting: Nurse Practitioner

## 2018-01-02 DIAGNOSIS — I4891 Unspecified atrial fibrillation: Secondary | ICD-10-CM

## 2018-01-02 DIAGNOSIS — Z79899 Other long term (current) drug therapy: Secondary | ICD-10-CM

## 2018-01-02 DIAGNOSIS — I480 Paroxysmal atrial fibrillation: Secondary | ICD-10-CM

## 2018-01-03 NOTE — Telephone Encounter (Signed)
Crcl 69ml/min - Pradaxa dose 75mg  BID with dronedarone. IF Crcl continues to fall, he will need to be changed from Pradaxa - as package insert states  -   Nonvalvular atrial fibrillation (to prevent stroke and systemic embolism): Any P-glycoprotein inducer (eg, rifampin): Avoid concurrent use. Dronedarone or ketoconazole (oral) with CrCl 30 to 50 mL/minute: Reduce dabigatran dose to 75 mg twice daily. Any P-glycoprotein inhibitor (eg, amiodarone, clarithromycin, dronedarone, ketoconazole [oral], quinidine, verapamil, and others) with CrCl <30 mL/minute: Avoid concurrent use.  Will send 6 month supply until follow up with Daniel Merle, NP - next scheduled appt. Will also route to her as FYI.

## 2018-01-05 ENCOUNTER — Encounter: Payer: Self-pay | Admitting: Internal Medicine

## 2018-01-05 NOTE — Progress Notes (Signed)
Patient ID: Daniel [name], male   DOB: Nov 19, 1941, 77 y.o.   MRN: [id number]  BMP Na 139, K 5.0, Cr 1.37, eGFR 58, Ca 10.4  Serum osmolality 281 Urine osmolality 253 Urine sodium 31  Hyponatremia resolved, likely with an improved diet.  Patient will be mailed the results per his request (no phone).

## 2018-05-03 ENCOUNTER — Encounter: Payer: Self-pay | Admitting: Nurse Practitioner

## 2018-05-24 ENCOUNTER — Ambulatory Visit: Payer: Medicare Other | Admitting: Nurse Practitioner
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
## 2018-08-23 ENCOUNTER — Other Ambulatory Visit: Payer: Self-pay | Admitting: Cardiology

## 2018-08-23 DIAGNOSIS — I480 Paroxysmal atrial fibrillation: Secondary | ICD-10-CM

## 2018-08-23 DIAGNOSIS — I4891 Unspecified atrial fibrillation: Secondary | ICD-10-CM

## 2018-08-23 DIAGNOSIS — Z79899 Other long term (current) drug therapy: Secondary | ICD-10-CM

## 2018-08-29 ENCOUNTER — Other Ambulatory Visit: Payer: Self-pay | Admitting: Internal Medicine

## 2018-08-29 ENCOUNTER — Other Ambulatory Visit: Payer: Self-pay | Admitting: Cardiology

## 2018-08-29 DIAGNOSIS — I4891 Unspecified atrial fibrillation: Secondary | ICD-10-CM

## 2018-08-29 DIAGNOSIS — I480 Paroxysmal atrial fibrillation: Secondary | ICD-10-CM

## 2018-08-29 DIAGNOSIS — Z79899 Other long term (current) drug therapy: Secondary | ICD-10-CM

## 2018-08-29 MED ORDER — DABIGATRAN ETEXILATE MESYLATE 75 MG PO CAPS: 75.0000 mg | ORAL_CAPSULE | Freq: Two times a day (BID) | ORAL | 3 refills | Status: DC

## 2018-08-29 NOTE — Telephone Encounter (Signed)
Needs refills on PRADAXA 75 MG CAPS capsule(bloodthinner) @ [facility]; pt contact#  [phone]

## 2018-09-30 ENCOUNTER — Encounter: Payer: Self-pay | Admitting: Internal Medicine

## 2018-09-30 ENCOUNTER — Ambulatory Visit (INDEPENDENT_AMBULATORY_CARE_PROVIDER_SITE_OTHER): Payer: Medicare Other | Admitting: Internal Medicine

## 2018-09-30 VITALS — BP 150/50 | HR 52 | Temp 97.7°F | Wt 107.8 lb

## 2018-09-30 DIAGNOSIS — E440 Moderate protein-calorie malnutrition: Secondary | ICD-10-CM

## 2018-09-30 DIAGNOSIS — I480 Paroxysmal atrial fibrillation: Secondary | ICD-10-CM

## 2018-09-30 DIAGNOSIS — Z86010 Personal history of colonic polyps: Secondary | ICD-10-CM

## 2018-09-30 DIAGNOSIS — E43 Unspecified severe protein-calorie malnutrition: Secondary | ICD-10-CM | POA: Insufficient documentation

## 2018-09-30 DIAGNOSIS — E871 Hypo-osmolality and hyponatremia: Secondary | ICD-10-CM

## 2018-09-30 DIAGNOSIS — Z681 Body mass index (BMI) 19 or less, adult: Secondary | ICD-10-CM

## 2018-09-30 DIAGNOSIS — F17200 Nicotine dependence, unspecified, uncomplicated: Secondary | ICD-10-CM

## 2018-09-30 DIAGNOSIS — Z7901 Long term (current) use of anticoagulants: Secondary | ICD-10-CM

## 2018-09-30 DIAGNOSIS — E875 Hyperkalemia: Secondary | ICD-10-CM

## 2018-09-30 DIAGNOSIS — Z79899 Other long term (current) drug therapy: Secondary | ICD-10-CM | POA: Diagnosis not present

## 2018-09-30 DIAGNOSIS — M549 Dorsalgia, unspecified: Secondary | ICD-10-CM | POA: Diagnosis not present

## 2018-09-30 DIAGNOSIS — I10 Essential (primary) hypertension: Secondary | ICD-10-CM | POA: Diagnosis not present

## 2018-09-30 DIAGNOSIS — I4891 Unspecified atrial fibrillation: Secondary | ICD-10-CM | POA: Diagnosis not present

## 2018-09-30 DIAGNOSIS — Z0000 Encounter for general adult medical examination without abnormal findings: Secondary | ICD-10-CM

## 2018-09-30 DIAGNOSIS — I129 Hypertensive chronic kidney disease with stage 1 through stage 4 chronic kidney disease, or unspecified chronic kidney disease: Secondary | ICD-10-CM | POA: Diagnosis not present

## 2018-09-30 DIAGNOSIS — Z720 Tobacco use: Secondary | ICD-10-CM

## 2018-09-30 DIAGNOSIS — N183 Chronic kidney disease, stage 3 (moderate): Secondary | ICD-10-CM | POA: Diagnosis not present

## 2018-09-30 DIAGNOSIS — E538 Deficiency of other specified B group vitamins: Secondary | ICD-10-CM | POA: Diagnosis not present

## 2018-09-30 DIAGNOSIS — D126 Benign neoplasm of colon, unspecified: Secondary | ICD-10-CM

## 2018-09-30 HISTORY — DX: Moderate protein-calorie malnutrition: E44.0

## 2018-09-30 NOTE — Assessment & Plan Note (Signed)
Assessment  He denies any palpitations, chest tightness, or shortness of breath.  Examination revealed a regular rhythm suggestive that he may be in sinus rhythm at this time.  His current regimen includes dronedarone 400 mg by mouth twice daily and Toprol-XL 50 mg by mouth daily should he flip back into atrial fibrillation.  His anticoagulation is dabigatran 75 mg by mouth twice daily.  Plan  We will continue the dronedarone 400 mg by mouth twice daily and Toprol-XL 50 mg by mouth daily.  We will also continue the dabigatran 75 mg by mouth twice daily which is dosed in the setting of concomitant dronedarone.  We will reassess the efficacy of this therapy in maintaining him in sinus rhythm at the follow-up visit.

## 2018-09-30 NOTE — Progress Notes (Signed)
   Subjective:    Patient ID: Daniel [name], male    DOB: 22-May-1941, 77 y.o.   MRN: [id number]  HPI  Cyrus Ramsburg is here for follow-up of his paroxysmal atrial fibrillation, moderate protein-calorie malnutrition, essential hypertension, tubulovillous adenoma of the colon, tobacco abuse, and vitamin B12 deficiency. Please see the A&P for the status of the pt's chronic medical problems.  Review of Systems  Constitutional: Negative for activity change, appetite change and unexpected weight change.  Respiratory: Negative for chest tightness, shortness of breath and wheezing.   Cardiovascular: Negative for chest pain, palpitations and leg swelling.  Gastrointestinal: Negative for abdominal pain, constipation, diarrhea, nausea and vomiting.  Genitourinary: Negative for difficulty urinating.  Musculoskeletal: Positive for back pain. Negative for joint swelling.      Objective:   Physical Exam  Constitutional: He is oriented to person, place, and time. He appears well-developed. No distress.  HENT:  Head: Normocephalic and atraumatic.  Eyes: Right eye exhibits no discharge. Left eye exhibits no discharge. No scleral icterus.  Cardiovascular: Normal rate, regular rhythm and normal heart sounds. Exam reveals no gallop and no friction rub.  No murmur heard. Pulmonary/Chest: Effort normal and breath sounds normal. No stridor. No respiratory distress. He has no wheezes. He has no rales.  Abdominal: Soft. Bowel sounds are normal. He exhibits no distension. There is no tenderness. There is no guarding.  Musculoskeletal: Normal range of motion. He exhibits no edema.  Neurological: He is alert and oriented to person, place, and time.  Skin: Skin is warm and dry. He is not diaphoretic.  Psychiatric: He has a normal mood and affect. His behavior is normal. Judgment and thought content normal.  Nursing note and vitals reviewed.     Assessment & Plan:   Please see problem oriented charting.

## 2018-09-30 NOTE — Assessment & Plan Note (Signed)
Assessment  He remains 4 years overdue for a surveillance colonoscopy given his history of tubulovillous adenoma excised endoscopically in 2010.  He also remains reticent to having a repeat colonoscopy understanding the risks.  Plan  We will reassess his willingness to undergo surveillance colonoscopy at the return visit given his prior tubulovillous adenoma.

## 2018-09-30 NOTE — Assessment & Plan Note (Signed)
Assessment  Although his BMI remains under 18 he is up 7 pounds since the last clinic visit at the end of December.  He states his appetite and oral intake is stable and denies any food insecurity.  Plan  He was praised for his weight gain and encouraged to continue his current diet in hopes of putting on some more weight.  We will reassess the efficacy of this input at the follow-up visit by repeating his weight.

## 2018-09-30 NOTE — Assessment & Plan Note (Signed)
Assessment  He admitted that he was taking the vitamin B12 tablet only intermittently.  Plan  We obtained a vitamin B12 level today and it is pending at the time of this dictation.  He was asked to continue to take his vitamin B12 on a regular basis.  We will follow-up on the results of the vitamin B12 level once available

## 2018-09-30 NOTE — Patient Instructions (Signed)
It was good to see you again.  1) Keep taking the medications as you are.  2) Call me if you change your mind about smoking, the flu and pneumonia shot, and the colonoscopy.  I will see you back in 1 year, sooner if necessary.

## 2018-09-30 NOTE — Assessment & Plan Note (Signed)
Assessment  We again discussed the importance of tobacco cessation for his overall health.  That said, he remains in the pre-contemplative stage and is on ready and unwilling to quit at this time.  Plan  He was asked to give me a call if he should want pharmacologic assistance in tobacco cessation were he to change his mind in the interim.  We will reassess his willingness to quit smoking at the follow-up visit.

## 2018-09-30 NOTE — Assessment & Plan Note (Signed)
Assessment  His blood pressure today is elevated at 150/50.  This is on enalapril 20 mg by mouth twice daily and Toprol-XL 50 mg by mouth daily.  His pulse is 52 and thus is not a candidate for an escalation in his Toprol-XL dose.  Plan  I recommended the escalation of his pharmacologic antihypertensive regimen and he refused to make any changes at this time.  We will therefore continue the enalapril 20 mg by mouth twice daily and Toprol-XL at 50 mg by mouth daily.  We will reassess his blood pressure control on this regimen at the follow-up visit.

## 2018-09-30 NOTE — Assessment & Plan Note (Signed)
He is currently uninterested in the flu vaccination, Pneumovax, or colonoscopy which are all due for his routine healthcare maintenance.  We will reassess his willingness to participate in preventative healthcare maintenance at the follow-up visit.

## 2018-10-01 LAB — CBC
Hematocrit: 30.7 % — ABNORMAL LOW (ref 37.5–51.0)
Hemoglobin: 10.1 g/dL — ABNORMAL LOW (ref 13.0–17.7)
MCH: 32.5 pg (ref 26.6–33.0)
MCHC: 32.9 g/dL (ref 31.5–35.7)
MCV: 99 fL — ABNORMAL HIGH (ref 79–97)
Platelets: 201 10*3/uL (ref 150–450)
RBC: 3.11 x10E6/uL — ABNORMAL LOW (ref 4.14–5.80)
RDW: 11.8 % — ABNORMAL LOW (ref 12.3–15.4)
WBC: 5.7 10*3/uL (ref 3.4–10.8)

## 2018-10-01 LAB — BMP8+ANION GAP
Anion Gap: 11 mmol/L (ref 10.0–18.0)
BUN/Creatinine Ratio: 9 — ABNORMAL LOW (ref 10–24)
BUN: 14 mg/dL (ref 8–27)
CO2: 21 mmol/L (ref 20–29)
Calcium: 10.5 mg/dL — ABNORMAL HIGH (ref 8.6–10.2)
Chloride: 95 mmol/L — ABNORMAL LOW (ref 96–106)
Creatinine, Ser: 1.48 mg/dL — ABNORMAL HIGH (ref 0.76–1.27)
GFR calc Af Amer: 52 mL/min/1.73 — ABNORMAL LOW
GFR calc non Af Amer: 45 mL/min/1.73 — ABNORMAL LOW
Glucose: 88 mg/dL (ref 65–99)
Potassium: 6 mmol/L — ABNORMAL HIGH (ref 3.5–5.2)
Sodium: 127 mmol/L — ABNORMAL LOW (ref 134–144)

## 2018-10-01 LAB — VITAMIN B12: Vitamin B-12: 664 pg/mL (ref 232–1245)

## 2018-10-03 ENCOUNTER — Encounter: Payer: Self-pay | Admitting: Internal Medicine

## 2018-10-03 NOTE — Addendum Note (Signed)
Addended by: [name] D on: 10/03/2018 03:59 PM   Modules accepted: Orders

## 2018-10-03 NOTE — Progress Notes (Signed)
Patient ID: Daniel [name], male   DOB: February 02, 1941, 78 y.o.   MRN: [id number]  BMP: Na 127, K 6.0, Cr 1.48, Ca 10.5, eGFR 52  Hyponatremia may be secondary to his poor diet.  Hyperkalemia may be hemolysis Vs the enalapril/diet in the setting of stage III CKD.  I am hesitant to stop the enalapril given his hypertension without confirming the hyperkalemia.  He does not have a phone and the phone number he provided me for a friend is not picking up.  I will send him a letter asking him to come to the clinic during normal business hours on a weekday for a repeat blood draw.  I will continue to try to call his friend Ms. Hartford Poli at [phone] to relay a message to him to present back to the clinic for repeat blood work.  CBC:  Hgb 10.1, WBC 5.7, Plt 201, MCV 99  Unchanged over the last year.  Will continue to stress the importance of a balanced diet.  Vitamin B12 664  Within normal limits.  To keep it there will ask him to continue with the oral supplementation.

## 2018-11-20 ENCOUNTER — Other Ambulatory Visit: Payer: Self-pay | Admitting: Internal Medicine

## 2018-11-20 DIAGNOSIS — I4891 Unspecified atrial fibrillation: Secondary | ICD-10-CM

## 2018-11-20 DIAGNOSIS — I10 Essential (primary) hypertension: Secondary | ICD-10-CM

## 2018-11-21 ENCOUNTER — Other Ambulatory Visit: Payer: Self-pay | Admitting: Internal Medicine

## 2018-11-21 DIAGNOSIS — I4891 Unspecified atrial fibrillation: Secondary | ICD-10-CM

## 2018-11-22 ENCOUNTER — Encounter: Payer: Self-pay | Admitting: Internal Medicine

## 2018-11-22 ENCOUNTER — Telehealth: Payer: Self-pay | Admitting: *Deleted

## 2018-11-22 NOTE — Telephone Encounter (Signed)
Will come in to clinic for lab work 12/4 at 1100

## 2018-11-22 NOTE — Telephone Encounter (Signed)
Pt called clinic today, he will come in for labs gave new telephone #[phone]

## 2018-11-22 NOTE — Telephone Encounter (Signed)
Have tried to reach patient for repeat labs.  Has not responded.  If he contacts [place] about this medications not being refilled please inform him we have been trying to get him in for a repeat lab drawn.  I will refill if he contacts [place] about the refill.  I will also send a letter since a message with his friend did not work.

## 2018-11-23 ENCOUNTER — Other Ambulatory Visit (INDEPENDENT_AMBULATORY_CARE_PROVIDER_SITE_OTHER): Payer: Medicare Other

## 2018-11-23 DIAGNOSIS — E440 Moderate protein-calorie malnutrition: Secondary | ICD-10-CM | POA: Diagnosis not present

## 2018-11-23 MED ORDER — ENALAPRIL MALEATE 20 MG PO TABS: 20.0000 mg | ORAL_TABLET | Freq: Two times a day (BID) | ORAL | 3 refills | Status: DC

## 2018-11-23 MED ORDER — DRONEDARONE HCL 400 MG PO TABS: 400.0000 mg | ORAL_TABLET | Freq: Two times a day (BID) | ORAL | 3 refills | Status: DC

## 2018-11-23 NOTE — Addendum Note (Signed)
Addended by: [name] D on: 11/23/2018 03:17 PM   Modules accepted: Orders

## 2018-11-24 ENCOUNTER — Encounter: Payer: Self-pay | Admitting: Internal Medicine

## 2018-11-24 LAB — BMP8+ANION GAP
Anion Gap: 14 mmol/L (ref 10.0–18.0)
BUN/Creatinine Ratio: 10 (ref 10–24)
BUN: 12 mg/dL (ref 8–27)
CO2: 19 mmol/L — ABNORMAL LOW (ref 20–29)
Calcium: 10.7 mg/dL — ABNORMAL HIGH (ref 8.6–10.2)
Chloride: 90 mmol/L — ABNORMAL LOW (ref 96–106)
Creatinine, Ser: 1.15 mg/dL (ref 0.76–1.27)
GFR calc Af Amer: 71 mL/min/{1.73_m2} (ref >59)
GFR calc non Af Amer: 61 mL/min/{1.73_m2} (ref >59)
Glucose: 87 mg/dL (ref 65–99)
Potassium: 5 mmol/L (ref 3.5–5.2)
Sodium: 123 mmol/L — ABNORMAL LOW (ref 134–144)

## 2018-11-24 NOTE — Progress Notes (Signed)
BMP: Na 123, K 5.0, BUN 12, Cr 1.15, eGFR 71, Ca 10.7  Renal insufficiency has resolved, but hyponatremia and hypercalcemia persist without obvious explanation.  In the past he had hyponatremia that was felt to be "tea and toast" with poor solute intake.  This spontaneously resolved with a better diet.  Although this could be secondary to a low solute diet, the hypercalcemia makes me concerned for other processes.  I have scheduled him for an appointment with me on Friday December 20 at 11:15 AM and sent a letter (he does not have a phone to contact him).  At that visit will obtain a CMP, PTH, serum osmolality, urine osmolality and sodium, and consider a CXR.

## 2018-12-05 ENCOUNTER — Other Ambulatory Visit: Payer: Self-pay | Admitting: Internal Medicine

## 2018-12-05 DIAGNOSIS — E785 Hyperlipidemia, unspecified: Secondary | ICD-10-CM

## 2018-12-09 ENCOUNTER — Encounter: Payer: Self-pay | Admitting: Internal Medicine

## 2018-12-09 ENCOUNTER — Ambulatory Visit (INDEPENDENT_AMBULATORY_CARE_PROVIDER_SITE_OTHER): Payer: Medicare Other | Admitting: Internal Medicine

## 2018-12-09 VITALS — BP 148/55 | HR 69 | Temp 98.4°F | Wt 105.5 lb

## 2018-12-09 DIAGNOSIS — F17210 Nicotine dependence, cigarettes, uncomplicated: Secondary | ICD-10-CM | POA: Diagnosis not present

## 2018-12-09 DIAGNOSIS — E440 Moderate protein-calorie malnutrition: Secondary | ICD-10-CM

## 2018-12-09 DIAGNOSIS — Z681 Body mass index (BMI) 19 or less, adult: Secondary | ICD-10-CM | POA: Diagnosis not present

## 2018-12-09 DIAGNOSIS — E871 Hypo-osmolality and hyponatremia: Secondary | ICD-10-CM | POA: Diagnosis not present

## 2018-12-09 NOTE — Assessment & Plan Note (Signed)
Assessment  Daniel [name] presents today to follow-up on his hyponatremia.  Once again he has a normovolemic hyponatremia and I believe it is related to a tea and toast diet.  This seemed to be the cause of his hyponatremia last year and based on his dietary history I believe it is the case this year as well.  Plan  We obtained a serum osmolality and basic metabolic panel.  He was unable to provide a urine osmolality and urine sodium, but states he will bring the sample into the clinic Monday morning.  Once these have resulted we will have a better idea as to what may be the cause of his hyponatremia.  He has spoken with both of his sisters who have provided him with some advice on dietary changes, all of which would increase his protein and solute with the hopes that the hyponatremia resolves as it has in the past.  He now has a new phone so is reachable via telephone.  The number is [phone].  I will contact him once I have the results in case further evaluation or therapy is necessary.

## 2018-12-09 NOTE — Assessment & Plan Note (Signed)
Assessment  He is lost 2 pounds in the last 2 months.  I believe his diet is very poor and consists of bread and lettuce.  I have spoken to him on numerous occasions about the need to have a more balanced diet.  I think the message is finally heading home and he has been scared by the hyponatremia.  He tells me his been talking to his sisters who have encouraged him to eat exactly what they tell him to.  This includes increased fruits, increased vegetables, submarine sandwiches that include ham, [place], and bologna as well as Ensure and an occasional Egg McMuffin.  At this point I would be thrilled if he would eat that diet consistently.  Plan  He will attempt to make these lifestyle changes in his diet.  We will assess the effects on the serum sodium and his weight.

## 2018-12-09 NOTE — Patient Instructions (Signed)
It was good to see you.  You look good.  1) Keep eating like your sisters have said.  Subs and egg mc muffins are ok.  2) Keep taking the medications as you are.  3) Bring the urine sample back on Monday.  I am not checking for any drugs, I am just checking for the salts and concentration.  4) Let me know if you are moving.  If you need to bring your records to your new doctor we have them here.  I will call you when I get the results at [phone]

## 2018-12-09 NOTE — Progress Notes (Signed)
   Subjective:    Patient ID: Daniel [name], male    DOB: [birth year], 77 y.o.   MRN: [id number]  HPI  Daniel [name] is here for follow-up of his hyponatremia. Please see the A&P for the status of the pt's chronic medical problems.  He is without acute complaints today specifically denying peripheral edema, orthopnea, or paroxysmal nocturnal dyspnea.  He has been nervous since he received the letter asking him to report to the clinic for further assessment.  Although I was able to get blood today he was unable to produce a urine specimen to assess his urine osmolality and sodium.  He will obtain the specimen and bring it into the clinic on Monday.  He tells me that he has been speaking with his sisters about his diet as I had raised with him the concern I had that this was resulting in his hyponatremia.  My fear was that he had a tea and toast diet.  He admits his diet was quite poor but his sisters have convinced him to eat fruit, oatmeal, sub-sandwiches with bologna, ham, and [place], and Egg McMuffin's on occasion.  They have also stressed the importance of him drinking his Ensure.  I am hopeful with this increase in protein and less reliance on lettuce and bread that his sodium may improve as it did 1 year ago under similar circumstances.  Review of Systems  Constitutional: Negative for activity change, appetite change and unexpected weight change.  Respiratory: Negative for shortness of breath.   Cardiovascular: Negative for chest pain, palpitations and leg swelling.  Gastrointestinal: Negative for abdominal distention.  Endocrine: Negative for polydipsia and polyuria.  Skin: Negative for rash.  Neurological: Negative for seizures and light-headedness.      Objective:   Physical Exam Vitals signs and nursing note reviewed.  Constitutional:      General: He is not in acute distress.    Appearance: He is not ill-appearing, toxic-appearing or diaphoretic.  HENT:     Head: Normocephalic and  atraumatic.  Eyes:     General: No scleral icterus.       Right eye: No discharge.        Left eye: No discharge.  Musculoskeletal:        General: No swelling.  Skin:    General: Skin is warm and dry.     Coloration: Skin is not jaundiced.  Neurological:     Mental Status: He is alert and oriented to person, place, and time. Mental status is at baseline.     Coordination: Coordination normal.     Gait: Gait normal.  Psychiatric:        Mood and Affect: Mood normal.        Behavior: Behavior normal.        Thought Content: Thought content normal.        Judgment: Judgment normal.       Assessment & Plan:   Please see problem based charting.

## 2018-12-13 LAB — BMP8+ANION GAP
Anion Gap: 12 mmol/L (ref 10.0–18.0)
BUN/Creatinine Ratio: 6 — ABNORMAL LOW (ref 10–24)
BUN: 8 mg/dL (ref 8–27)
CO2: 20 mmol/L (ref 20–29)
Calcium: 9.8 mg/dL (ref 8.6–10.2)
Chloride: 94 mmol/L — ABNORMAL LOW (ref 96–106)
Creatinine, Ser: 1.38 mg/dL — ABNORMAL HIGH (ref 0.76–1.27)
GFR calc Af Amer: 57 mL/min/{1.73_m2} — ABNORMAL LOW (ref >59)
GFR calc non Af Amer: 49 mL/min/{1.73_m2} — ABNORMAL LOW (ref >59)
Glucose: 90 mg/dL (ref 65–99)
Potassium: 4.5 mmol/L (ref 3.5–5.2)
Sodium: 126 mmol/L — ABNORMAL LOW (ref 134–144)

## 2018-12-13 LAB — OSMOLALITY: Osmolality Meas: 264 mOsmol/kg — ABNORMAL LOW (ref 280–301)

## 2018-12-16 ENCOUNTER — Other Ambulatory Visit: Payer: Medicare Other

## 2018-12-23 ENCOUNTER — Other Ambulatory Visit: Payer: Medicare Other

## 2018-12-23 DIAGNOSIS — E871 Hypo-osmolality and hyponatremia: Secondary | ICD-10-CM | POA: Diagnosis not present

## 2018-12-25 LAB — OSMOLALITY, URINE: Osmolality, Ur: 169 mOsmol/kg

## 2018-12-25 LAB — SODIUM, URINE, RANDOM: Sodium, Ur: 41 mmol/L

## 2019-01-12 ENCOUNTER — Telehealth: Payer: Self-pay | Admitting: *Deleted

## 2019-01-12 DIAGNOSIS — I10 Essential (primary) hypertension: Secondary | ICD-10-CM

## 2019-01-12 MED ORDER — ENALAPRIL MALEATE 10 MG PO TABS: 20.0000 mg | ORAL_TABLET | Freq: Two times a day (BID) | ORAL | 3 refills | Status: DC

## 2019-01-12 NOTE — Telephone Encounter (Signed)
Received fax from pt's pharmacy with the following message:  "Enalapril 20mg  tablets are on backorder, please authorize enalapril 10mg  2 BID #360"  Will send to pcp for review, please advise.Regenia Skeeter, Nyja Westbrook Cassady1/23/20208:40 AM

## 2019-02-03 NOTE — Progress Notes (Signed)
Patient ID: Daniel [name], male   DOB: 1941-06-08, 78 y.o.   MRN: [id number]  Serum osmolality 264  BMP: Na 126, K 4.5, Cr 1.38, glucose 90, eGFR 57  Urine osmolality 169  Urine sodium 41  Please note, the patient provided the urine sample more than a week after he provided the blood sample because he could not provide a urine sample at the same time.  That said, based upon these results, I believe his chronic hyponatremia is secondary to reset osmostat from chronic malnutrition given the mild, but persistent nature of the hyponatremia with no other causes that fit his examination, course, medical history, or physical examination.  No further evaluation or intervention is necessary other than continued attempts to improve his nutritional status.

## 2019-03-19 ENCOUNTER — Encounter: Payer: Self-pay | Admitting: *Deleted

## 2019-09-29 ENCOUNTER — Other Ambulatory Visit: Payer: Self-pay | Admitting: Student in an Organized Health Care Education/Training Program

## 2019-09-29 DIAGNOSIS — I4891 Unspecified atrial fibrillation: Secondary | ICD-10-CM

## 2019-09-29 MED ORDER — DABIGATRAN ETEXILATE MESYLATE 75 MG PO CAPS: 75.0000 mg | ORAL_CAPSULE | Freq: Two times a day (BID) | ORAL | 3 refills | Status: DC

## 2019-09-29 NOTE — Telephone Encounter (Signed)
Needs refill on dabigatran (PRADAXA) 75 MG CAPS capsule  ;pt contact [facility], [address]  Pt was nervous about in the hospital due to [facility] patient schedule appt on 11/13/19 1015am

## 2019-11-13 ENCOUNTER — Encounter: Payer: Medicare Other | Admitting: Student in an Organized Health Care Education/Training Program

## 2019-11-14 ENCOUNTER — Other Ambulatory Visit: Payer: Self-pay

## 2019-12-25 ENCOUNTER — Encounter: Payer: Medicare Other | Admitting: Student in an Organized Health Care Education/Training Program

## 2020-01-03 ENCOUNTER — Other Ambulatory Visit: Payer: Self-pay | Admitting: Student in an Organized Health Care Education/Training Program

## 2020-01-03 DIAGNOSIS — I4891 Unspecified atrial fibrillation: Secondary | ICD-10-CM

## 2020-01-03 MED ORDER — MULTAQ 400 MG PO TABS: 400.0000 mg | ORAL_TABLET | Freq: Two times a day (BID) | ORAL | 0 refills | Status: DC

## 2020-01-03 NOTE — Telephone Encounter (Signed)
Will approve a one month supply to give them time to follow up with [place] in clinic.

## 2020-01-03 NOTE — Telephone Encounter (Signed)
Refill Request dronedarone (MULTAQ) 400 MG tablet   [facility] [place], [place] [address] [place]

## 2020-01-10 ENCOUNTER — Other Ambulatory Visit: Payer: Self-pay | Admitting: *Deleted

## 2020-01-10 DIAGNOSIS — I10 Essential (primary) hypertension: Secondary | ICD-10-CM

## 2020-01-10 MED ORDER — ENALAPRIL MALEATE 10 MG PO TABS: 20.0000 mg | ORAL_TABLET | Freq: Two times a day (BID) | ORAL | 3 refills | Status: DC

## 2020-01-10 NOTE — Telephone Encounter (Signed)
Dr Evette Doffing how would you like pt's appt, telehealth with you or with [facility]? Please advise chilon, thanks

## 2020-01-10 NOTE — Telephone Encounter (Signed)
Telehealth with me at my next available is fine. If he wants to talk about his dental pain sooner, we can offer tele visit with [facility]. Thanks!

## 2020-01-23 ENCOUNTER — Telehealth: Payer: Self-pay | Admitting: Student in an Organized Health Care Education/Training Program

## 2020-01-23 ENCOUNTER — Encounter: Payer: Self-pay | Admitting: Student in an Organized Health Care Education/Training Program

## 2020-01-23 NOTE — Telephone Encounter (Signed)
Pt is sch for his COVID shot on Saturday @10 :30am @ the [facility].  He is asked to bring a letter stating because he is on blood thinner. Pt is requesting a call back about providing this documentation.

## 2020-01-23 NOTE — Telephone Encounter (Signed)
This is a horrible and dangerous policy which will limit vaccine access to the group of patients that is most at need. I will of course provide this letter, though the information contained in the letter is obvious. Will leave it in the box today.

## 2020-01-23 NOTE — Telephone Encounter (Signed)
Patient notified that letter is ready for pick up at [facility]. He states he will pick it up tomorrow. He was appreciative. Hubbard Hartshorn, BSN, RN-BC

## 2020-02-09 ENCOUNTER — Other Ambulatory Visit: Payer: Self-pay | Admitting: Student in an Organized Health Care Education/Training Program

## 2020-02-09 DIAGNOSIS — E785 Hyperlipidemia, unspecified: Secondary | ICD-10-CM

## 2020-02-09 DIAGNOSIS — I4891 Unspecified atrial fibrillation: Secondary | ICD-10-CM

## 2020-02-09 NOTE — Telephone Encounter (Signed)
Need refill on metoprolol succinate (TOPROL-XL) 50 MG 24 hr tablet atorvastatin (LIPITOR) 20 MG tablet pt contact [place], [address]  Pt is completely out of medicine

## 2020-02-09 NOTE — Telephone Encounter (Signed)
Next appt scheduled 4/26 with PCP.

## 2020-02-12 MED ORDER — ATORVASTATIN CALCIUM 20 MG PO TABS: 20.0000 mg | ORAL_TABLET | Freq: Every day | ORAL | 3 refills | Status: DC

## 2020-02-12 MED ORDER — METOPROLOL SUCCINATE ER 50 MG PO TB24: 50.0000 mg | ORAL_TABLET | Freq: Every day | ORAL | 3 refills | Status: DC

## 2020-04-15 ENCOUNTER — Ambulatory Visit (INDEPENDENT_AMBULATORY_CARE_PROVIDER_SITE_OTHER): Payer: Medicare Other | Admitting: Student in an Organized Health Care Education/Training Program

## 2020-04-15 ENCOUNTER — Ambulatory Visit (HOSPITAL_COMMUNITY)
Admission: RE | Admit: 2020-04-15 | Discharge: 2020-04-15 | Disposition: A | Discharge status: Home / Self Care | Payer: Medicare Other | Source: Ambulatory Visit | Attending: Internal Medicine | Admitting: Internal Medicine

## 2020-04-15 ENCOUNTER — Encounter: Payer: Self-pay | Admitting: Student in an Organized Health Care Education/Training Program

## 2020-04-15 ENCOUNTER — Other Ambulatory Visit: Payer: Self-pay

## 2020-04-15 VITALS — BP 169/57 | HR 84 | Temp 97.6°F | Ht 69.0 in | Wt 101.1 lb

## 2020-04-15 DIAGNOSIS — I4891 Unspecified atrial fibrillation: Secondary | ICD-10-CM | POA: Diagnosis not present

## 2020-04-15 DIAGNOSIS — R634 Abnormal weight loss: Secondary | ICD-10-CM | POA: Insufficient documentation

## 2020-04-15 DIAGNOSIS — Z114 Encounter for screening for human immunodeficiency virus [HIV]: Secondary | ICD-10-CM

## 2020-04-15 DIAGNOSIS — R079 Chest pain, unspecified: Secondary | ICD-10-CM | POA: Insufficient documentation

## 2020-04-15 DIAGNOSIS — R1310 Dysphagia, unspecified: Secondary | ICD-10-CM | POA: Insufficient documentation

## 2020-04-15 DIAGNOSIS — I483 Typical atrial flutter: Secondary | ICD-10-CM | POA: Insufficient documentation

## 2020-04-15 DIAGNOSIS — E440 Moderate protein-calorie malnutrition: Secondary | ICD-10-CM

## 2020-04-15 DIAGNOSIS — R9431 Abnormal electrocardiogram [ECG] [EKG]: Secondary | ICD-10-CM | POA: Diagnosis not present

## 2020-04-15 DIAGNOSIS — E871 Hypo-osmolality and hyponatremia: Secondary | ICD-10-CM

## 2020-04-15 MED ORDER — PANTOPRAZOLE SODIUM 40 MG PO TBEC: 40.0000 mg | DELAYED_RELEASE_TABLET | Freq: Every day | ORAL | 3 refills | Status: DC

## 2020-04-15 NOTE — Assessment & Plan Note (Signed)
Significant unintentional weight loss, weight is 101 pounds today with a BMI of 14.9.  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  Plan to check labs today with CBC, CMP, lipids, and TSH.  Follow-up closely, supplement nutrition with boost or Ensure.  If no improvement, could do CT chest to rule out new lung cancer and repeat colonoscopy given history of adenoma.

## 2020-04-15 NOTE — Progress Notes (Signed)
   Assessment and Plan:  See Encounters tab for problem-based medical decision making.    __________________________________________________________  HPI:   79 year old Daniel [name] here for follow-up of atrial fibrillation.  This is my first time meeting the patient, formerly taken care of by Dr. Eppie Gibson.  Its been about a year and a half since we have seen him in the clinic, he has been trepidatious about coming because of COVID-19.  One acute complaint today of a right-sided chest pain which she describes as gas type pain.  He feels this pain a few times a day over the last 3 weeks.  He gets better as he walks around and exerts himself.  Usually comes up after eating.  Located on the right side of his chest and radiates around his right shoulder into his back.  No vomiting.  Feels some pain with eating food as well.  Denies a sensation of food being stuck.  No dyspnea on exertion, orthopnea, or PND.  He lives by himself, independent in all activities of daily living, still drives.  Able to do all his grocery shopping, just takes his time.  He has some stiffness in his ankles and other arthritis for which he takes ibuprofen every day.  He has a history of gastric ulcer at one time requiring hospitalization.  He is anticoagulated on Pradaxa for his history of atrial fibrillation.  Denies any dizziness or palpitations.  No recent fevers or chills.  No hospitalizations or emergency department visits in the last year.   __________________________________________________________  Problem List: Patient Active Problem List   Diagnosis Date Noted  . Hyponatremia 12/10/2017    Priority: High  . Atrial fibrillation status post cardioversion [facility]) 06/05/2010    Priority: High  . Moderate protein-calorie malnutrition ([place]) 09/30/2018    Priority: Medium  . Peripheral artery disease ([place]) 08/13/2011    Priority: Medium  . Hyperlipidemia 06/21/2007    Priority: Medium  . Essential hypertension 05/25/2007   Priority: Medium  . Tobacco abuse 12/08/2006    Priority: Medium  . Vitamin B12 deficiency 09/13/2015    Priority: Low  . Healthcare maintenance 03/03/2013    Priority: Low  . 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 Benign prostatic hyperplasia 03/03/2012    Priority: Low  . Osteoarthritis of thoracolumbar spine 12/08/2006    Priority: Low  . Chest pain 04/15/2020  . Weight loss, unintentional 04/15/2020    Medications: Reconciled today in Epic __________________________________________________________  Physical Exam:  Vital Signs: Vitals:   04/15/20 1001  BP: (!) 169/57  Pulse: 84  Temp: 97.6 F (36.4 C)  TempSrc: Oral  SpO2: 100%  Weight: 101 lb 1.6 oz (45.9 kg)  Height: 5\' 9"  (1.753 m)    Gen: Chronically ill-appearing man, no distress ENT: OP clear without erythema or exudate.  Neck: No cervical LAD, No thyromegaly or nodules, JVP 2 cm above the sternum CV: Distant heart sounds, RRR, no murmurs Pulm: Normal effort, mostly clear but occasional low pitched wheeze at the bases, no effusions on percussion Abd: Soft, very thin, hepatomegaly present 3 cm below the ribs, no splenomegaly appreciated Ext: Warm, no edema, normal joints

## 2020-04-15 NOTE — Assessment & Plan Note (Signed)
Chest pain today seems atypical, as it is not exertional, no active chest pain today on exam.  EKG reassuring with no Q waves or other signs of recent ischemia.  He enjoys a good exertional capacity.  Given the chest pain is so related to eating, and with his history of gastric ulcers and ongoing ibuprofen use, good with focus on treating for dyspepsia for now.  Plan is to start pantoprazole 40 mg daily, advised stopping all NSAIDs.  If no better in a few weeks refer to GI for endoscopy.  If the chest pain becomes more exertional in the future could refocus efforts to an ischemic evaluation.

## 2020-04-15 NOTE — Patient Instructions (Signed)
For your gas type pain, we will try a acid suppressing medicine called pantoprazole.  Take 1 tablet daily.  Please also stop using ibuprofen as that can lead to a stomach ulcer.  If you need a arthritis type pain medicine, please use Tylenol 500 mg two or three times daily  Call me if the gas type pain does not resolve in a couple weeks and I will refer you to a stomach doctor

## 2020-04-15 NOTE — Assessment & Plan Note (Signed)
Issue noted on labs in 2019, has been an intermittent issue in the past.  Prior labs consistent with low solute intake.  Patient continues to be very thin with a BMI a little under 15.  Plan to repeat BMP today with urine awesome and urine sodium.  Protein supplements like Ensure or boost may help with his low solute state.

## 2020-04-15 NOTE — Assessment & Plan Note (Addendum)
Stable history of atrial fibrillation, EKG today shows typical slow atrial flutter.  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  Plan is to continue with dronedarone for rhythm control.  Anticoagulated with dabigatran for primary stroke prophylaxis.  I mentioned that it would be ideal to repeat an echocardiography especially given his moderate mitral regurgitation, he is going to consider.

## 2020-04-16 ENCOUNTER — Encounter: Payer: Self-pay | Admitting: Student in an Organized Health Care Education/Training Program

## 2020-04-16 LAB — CMP14 + ANION GAP
ALT: 6 IU/L (ref 0–44)
AST: 17 IU/L (ref 0–40)
Albumin/Globulin Ratio: 1.7 (ref 1.2–2.2)
Albumin: 4 g/dL (ref 3.7–4.7)
Alkaline Phosphatase: 86 IU/L (ref 39–117)
Anion Gap: 14 mmol/L (ref 10.0–18.0)
BUN/Creatinine Ratio: 9 — ABNORMAL LOW (ref 10–24)
BUN: 12 mg/dL (ref 8–27)
Bilirubin Total: 0.2 mg/dL (ref 0.0–1.2)
CO2: 20 mmol/L (ref 20–29)
Calcium: 10.6 mg/dL — ABNORMAL HIGH (ref 8.6–10.2)
Chloride: 95 mmol/L — ABNORMAL LOW (ref 96–106)
Creatinine, Ser: 1.39 mg/dL — ABNORMAL HIGH (ref 0.76–1.27)
GFR calc Af Amer: 56 mL/min/{1.73_m2} — ABNORMAL LOW (ref >59)
GFR calc non Af Amer: 48 mL/min/{1.73_m2} — ABNORMAL LOW (ref >59)
Globulin, Total: 2.4 g/dL (ref 1.5–4.5)
Glucose: 81 mg/dL (ref 65–99)
Potassium: 4.5 mmol/L (ref 3.5–5.2)
Sodium: 129 mmol/L — ABNORMAL LOW (ref 134–144)
Total Protein: 6.4 g/dL (ref 6.0–8.5)

## 2020-04-16 LAB — LIPID PANEL
Chol/HDL Ratio: 2.3 ratio (ref 0.0–5.0)
Cholesterol, Total: 109 mg/dL (ref 100–199)
HDL: 48 mg/dL (ref >39)
LDL Chol Calc (NIH): 48 mg/dL (ref 0–99)
Triglycerides: 54 mg/dL (ref 0–149)
VLDL Cholesterol Cal: 13 mg/dL (ref 5–40)

## 2020-04-16 LAB — CBC
Hematocrit: 31.6 % — ABNORMAL LOW (ref 37.5–51.0)
Hemoglobin: 10.8 g/dL — ABNORMAL LOW (ref 13.0–17.7)
MCH: 32.9 pg (ref 26.6–33.0)
MCHC: 34.2 g/dL (ref 31.5–35.7)
MCV: 96 fL (ref 79–97)
Platelets: 216 10*3/uL (ref 150–450)
RBC: 3.28 x10E6/uL — ABNORMAL LOW (ref 4.14–5.80)
RDW: 11.4 % — ABNORMAL LOW (ref 11.6–15.4)
WBC: 6.3 10*3/uL (ref 3.4–10.8)

## 2020-04-16 LAB — OSMOLALITY, URINE: Osmolality, Ur: 248 mOsmol/kg

## 2020-04-16 LAB — TSH: TSH: 2.4 u[IU]/mL (ref 0.450–4.500)

## 2020-04-16 LAB — SODIUM, URINE, RANDOM: Sodium, Ur: 35 mmol/L

## 2020-04-16 LAB — OSMOLALITY: Osmolality Meas: 267 mOsmol/kg — ABNORMAL LOW (ref 280–301)

## 2020-06-03 ENCOUNTER — Ambulatory Visit (INDEPENDENT_AMBULATORY_CARE_PROVIDER_SITE_OTHER): Payer: Medicare Other | Admitting: Student in an Organized Health Care Education/Training Program

## 2020-06-03 ENCOUNTER — Encounter: Payer: Self-pay | Admitting: Student in an Organized Health Care Education/Training Program

## 2020-06-03 VITALS — BP 135/60 | HR 97 | Temp 97.7°F | Ht 69.0 in | Wt 97.2 lb

## 2020-06-03 DIAGNOSIS — R1310 Dysphagia, unspecified: Secondary | ICD-10-CM | POA: Diagnosis present

## 2020-06-03 NOTE — Progress Notes (Signed)
   Assessment and Plan:  See Encounters tab for problem-based medical decision making.   __________________________________________________________  HPI:   79 year old person here for a focused follow-up of chest pain that we discussed last April.  This was an atypical type of gassy chest burning which was nonexertional.  We tried medical management with metoprolol but he was unable to tolerate this due to perception that it caused him lightheadedness.  Symptoms have progressed, now he describes odynophagia with significant pain whenever he swallows food.  Feels like the food gets stuck in his mid chest.  It lasts for about 30 minutes and then slowly resolves.  He has no problem with liquids.  He is having some difficulty swallowing pills.  He still is using ibuprofen which she feels helps with his chest pain for about 3 hours at a time.  Denies any emesis.  Weight is stable.  No changes in bowel habits.  He has no chest pain when he is not eating, there is no exertional component to this chest pain.  No fevers or chills.  He is independent in his activities of daily living, still drives himself, continues to smoke tobacco.  __________________________________________________________  Problem List: Patient Active Problem List   Diagnosis Date Noted  . Hyponatremia 12/10/2017    Priority: High  . Atrial fibrillation status post cardioversion [facility] 06/05/2010    Priority: High  . Moderate protein-calorie malnutrition ([place]) 09/30/2018    Priority: Medium  . Peripheral artery disease ([place]) 08/13/2011    Priority: Medium  . Hyperlipidemia 06/21/2007    Priority: Medium  . Essential hypertension 05/25/2007    Priority: Medium  . Tobacco abuse 12/08/2006    Priority: Medium  . Vitamin B12 deficiency 09/13/2015    Priority: Low  . Healthcare maintenance 03/03/2013    Priority: Low  . 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 Benign prostatic hyperplasia 03/03/2012     Priority: Low  . Osteoarthritis of thoracolumbar spine 12/08/2006    Priority: Low  . Odynophagia 04/15/2020  . Weight loss, unintentional 04/15/2020    Medications: Reconciled today in Epic __________________________________________________________  Physical Exam:  Vital Signs: Vitals:   06/03/20 1045  BP: 135/60  Pulse: 97  Temp: 97.7 F (36.5 C)  TempSrc: Oral  Weight: 97 lb 3.2 oz (44.1 kg)    Gen: Chronically ill-appearing, thin man Neck: No cervical LAD, No thyromegaly or nodules CV: RRR, no murmurs Pulm: Normal effort, CTA throughout, no wheezing Abd: Soft, NT, ND, very thin Skin: No atypical appearing moles. No rashes

## 2020-06-03 NOTE — Assessment & Plan Note (Signed)
This is a noncardiac chest pain, occurring with swallowing solids, sensation of food being stuck in the mid chest, no problems with liquids.  Seems most consistent with some type of esophageal disease.  He is at risk for esophageal malignancy given the history of tobacco use.  Also uses NSAIDs so might have esophagitis, ulcer, ring or web.  Symptoms are getting worse, will refer him at this point to GI for endoscopy to evaluate for structural disease of the esophagus.  Records indicate he did have an endoscopy in 2011 for evaluation of a gastric mass which turned out to appear low risk on EUS.  I again advised against the use of NSAIDs.  He was unable to tolerate pantoprazole due to perceived side effect.  We will follow-up endoscopy results, given his advanced age, underweight, tobacco use, I am most suspicious for an underlying malignancy.

## 2020-06-03 NOTE — Patient Instructions (Signed)
I have placed a referral for you to the stomach doctors to look into your esophagus to find what is causing you pain with swallowing.  If you do not hear from their office to schedule an appointment this week, please let us know.

## 2020-06-04 ENCOUNTER — Encounter: Payer: Self-pay | Admitting: Nurse Practitioner

## 2020-06-17 ENCOUNTER — Telehealth: Payer: Self-pay | Admitting: *Deleted

## 2020-06-17 NOTE — Telephone Encounter (Signed)
Pt calls and ask if  He could speak to dr Evette Doffing about his throat, states he knows he has appt coming up w/ [facility] but it is 7/20 and he doesn't know if he can wait that long. Tried to reassure him and informed him note would be sent to dr Evette Doffing

## 2020-06-18 ENCOUNTER — Telehealth: Payer: Self-pay | Admitting: Internal Medicine

## 2020-06-18 NOTE — Telephone Encounter (Signed)
Patient has been rescheduled.  He is asked to hold his Pradaxa and Dr. [name] will discuss more at appt tomorrow.

## 2020-06-18 NOTE — Telephone Encounter (Signed)
I have called Daniel [name] and left a message for dr gessner's nurse I will also forward this note to dr [name] and [name] triage direct ph# and my name

## 2020-06-18 NOTE — Telephone Encounter (Signed)
rtc from [place], appt has been moved up to July 6 at 1330. They will call pt and make him aware, explain everything to him. If there are issues they will call [facility] triage

## 2020-06-18 NOTE — Telephone Encounter (Signed)
I spoke with the patient today. He is having persistent pain with swallowing which is limiting the amount of food he can eat. Currently only eating one sandwich a day. No vomiting. Not having problems with liquids. Struggles to get pills down, currently doing ok with his chronic meds but unable to tolerate larger pills. We did a referral to GI to consider EGD and he has a new patient visit scheduled for [place] with Carl Best for 7/20. He doesn't think he can wait that long given his worsening nutrition and struggles with taking pills. He has trouble navigating the health system himself and doesn't have much family to help. Can we contact [facility] to see if we can move his appointment sooner. He may have achalasia that needs dilation to resolve this, also risk of esophageal malignancy here. I doubt an esophagram will add much, certainly wouldn't be therapeutic. Also doubt medical therapy will add much at this point.

## 2020-06-18 NOTE — Telephone Encounter (Signed)
I am willing to add him on at 1130 tomorrow if we can do that  I advise he hold the Pradaxa in anticipation of an EGD soon AND it may be the cause of his problems

## 2020-06-18 NOTE — Telephone Encounter (Signed)
Thanks so much everyone for working this person in so quickly. I am very grateful.

## 2020-06-19 ENCOUNTER — Telehealth: Payer: Self-pay

## 2020-06-19 ENCOUNTER — Encounter (HOSPITAL_COMMUNITY): Payer: Self-pay | Admitting: Emergency Medicine

## 2020-06-19 ENCOUNTER — Ambulatory Visit (INDEPENDENT_AMBULATORY_CARE_PROVIDER_SITE_OTHER): Payer: Medicare Other | Admitting: Internal Medicine

## 2020-06-19 ENCOUNTER — Encounter: Payer: Self-pay | Admitting: Internal Medicine

## 2020-06-19 ENCOUNTER — Other Ambulatory Visit: Payer: Self-pay

## 2020-06-19 ENCOUNTER — Inpatient Hospital Stay (HOSPITAL_COMMUNITY)
Admission: EM | Admit: 2020-06-19 | Discharge: 2020-07-09 | DRG: 374 | Disposition: A | Discharge status: Home Health | Payer: Medicare Other | Attending: Internal Medicine | Admitting: Internal Medicine

## 2020-06-19 ENCOUNTER — Other Ambulatory Visit (INDEPENDENT_AMBULATORY_CARE_PROVIDER_SITE_OTHER): Payer: Medicare Other

## 2020-06-19 VITALS — BP 140/60 | HR 72 | Ht 69.0 in | Wt 94.0 lb

## 2020-06-19 DIAGNOSIS — R634 Abnormal weight loss: Secondary | ICD-10-CM

## 2020-06-19 DIAGNOSIS — E785 Hyperlipidemia, unspecified: Secondary | ICD-10-CM | POA: Diagnosis present

## 2020-06-19 DIAGNOSIS — E875 Hyperkalemia: Secondary | ICD-10-CM | POA: Diagnosis not present

## 2020-06-19 DIAGNOSIS — R64 Cachexia: Secondary | ICD-10-CM | POA: Diagnosis present

## 2020-06-19 DIAGNOSIS — R1310 Dysphagia, unspecified: Secondary | ICD-10-CM

## 2020-06-19 DIAGNOSIS — I10 Essential (primary) hypertension: Secondary | ICD-10-CM | POA: Diagnosis not present

## 2020-06-19 DIAGNOSIS — R350 Frequency of micturition: Secondary | ICD-10-CM | POA: Diagnosis present

## 2020-06-19 DIAGNOSIS — M47815 Spondylosis without myelopathy or radiculopathy, thoracolumbar region: Secondary | ICD-10-CM | POA: Diagnosis present

## 2020-06-19 DIAGNOSIS — K5289 Other specified noninfective gastroenteritis and colitis: Secondary | ICD-10-CM | POA: Diagnosis present

## 2020-06-19 DIAGNOSIS — E43 Unspecified severe protein-calorie malnutrition: Secondary | ICD-10-CM | POA: Diagnosis present

## 2020-06-19 DIAGNOSIS — R0902 Hypoxemia: Secondary | ICD-10-CM

## 2020-06-19 DIAGNOSIS — L89312 Pressure ulcer of right buttock, stage 2: Secondary | ICD-10-CM | POA: Diagnosis present

## 2020-06-19 DIAGNOSIS — Z681 Body mass index (BMI) 19 or less, adult: Secondary | ICD-10-CM | POA: Diagnosis not present

## 2020-06-19 DIAGNOSIS — I110 Hypertensive heart disease with heart failure: Secondary | ICD-10-CM | POA: Diagnosis present

## 2020-06-19 DIAGNOSIS — Z888 Allergy status to other drugs, medicaments and biological substances status: Secondary | ICD-10-CM

## 2020-06-19 DIAGNOSIS — R109 Unspecified abdominal pain: Secondary | ICD-10-CM

## 2020-06-19 DIAGNOSIS — Z8249 Family history of ischemic heart disease and other diseases of the circulatory system: Secondary | ICD-10-CM

## 2020-06-19 DIAGNOSIS — C159 Malignant neoplasm of esophagus, unspecified: Secondary | ICD-10-CM | POA: Diagnosis not present

## 2020-06-19 DIAGNOSIS — I4891 Unspecified atrial fibrillation: Secondary | ICD-10-CM | POA: Diagnosis present

## 2020-06-19 DIAGNOSIS — E871 Hypo-osmolality and hyponatremia: Secondary | ICD-10-CM | POA: Diagnosis not present

## 2020-06-19 DIAGNOSIS — I480 Paroxysmal atrial fibrillation: Secondary | ICD-10-CM | POA: Diagnosis present

## 2020-06-19 DIAGNOSIS — Z66 Do not resuscitate: Secondary | ICD-10-CM | POA: Diagnosis present

## 2020-06-19 DIAGNOSIS — I739 Peripheral vascular disease, unspecified: Secondary | ICD-10-CM | POA: Diagnosis present

## 2020-06-19 DIAGNOSIS — Z8261 Family history of arthritis: Secondary | ICD-10-CM

## 2020-06-19 DIAGNOSIS — Z20822 Contact with and (suspected) exposure to covid-19: Secondary | ICD-10-CM | POA: Diagnosis not present

## 2020-06-19 DIAGNOSIS — I428 Other cardiomyopathies: Secondary | ICD-10-CM | POA: Diagnosis not present

## 2020-06-19 DIAGNOSIS — K5641 Fecal impaction: Secondary | ICD-10-CM | POA: Diagnosis not present

## 2020-06-19 DIAGNOSIS — K2970 Gastritis, unspecified, without bleeding: Secondary | ICD-10-CM | POA: Diagnosis present

## 2020-06-19 DIAGNOSIS — Z7189 Other specified counseling: Secondary | ICD-10-CM

## 2020-06-19 DIAGNOSIS — R1319 Other dysphagia: Secondary | ICD-10-CM

## 2020-06-19 DIAGNOSIS — G893 Neoplasm related pain (acute) (chronic): Secondary | ICD-10-CM

## 2020-06-19 DIAGNOSIS — R7881 Bacteremia: Secondary | ICD-10-CM | POA: Diagnosis present

## 2020-06-19 DIAGNOSIS — R54 Age-related physical debility: Secondary | ICD-10-CM | POA: Diagnosis present

## 2020-06-19 DIAGNOSIS — B961 Klebsiella pneumoniae [K. pneumoniae] as the cause of diseases classified elsewhere: Secondary | ICD-10-CM

## 2020-06-19 DIAGNOSIS — K567 Ileus, unspecified: Secondary | ICD-10-CM | POA: Diagnosis not present

## 2020-06-19 DIAGNOSIS — L89322 Pressure ulcer of left buttock, stage 2: Secondary | ICD-10-CM | POA: Diagnosis present

## 2020-06-19 DIAGNOSIS — K117 Disturbances of salivary secretion: Secondary | ICD-10-CM | POA: Diagnosis present

## 2020-06-19 DIAGNOSIS — R911 Solitary pulmonary nodule: Secondary | ICD-10-CM | POA: Diagnosis present

## 2020-06-19 DIAGNOSIS — R627 Adult failure to thrive: Secondary | ICD-10-CM | POA: Diagnosis present

## 2020-06-19 DIAGNOSIS — Z03818 Encounter for observation for suspected exposure to other biological agents ruled out: Secondary | ICD-10-CM | POA: Diagnosis not present

## 2020-06-19 DIAGNOSIS — D509 Iron deficiency anemia, unspecified: Secondary | ICD-10-CM | POA: Diagnosis present

## 2020-06-19 DIAGNOSIS — Z515 Encounter for palliative care: Secondary | ICD-10-CM

## 2020-06-19 DIAGNOSIS — N401 Enlarged prostate with lower urinary tract symptoms: Secondary | ICD-10-CM | POA: Diagnosis present

## 2020-06-19 DIAGNOSIS — E878 Other disorders of electrolyte and fluid balance, not elsewhere classified: Secondary | ICD-10-CM | POA: Diagnosis not present

## 2020-06-19 DIAGNOSIS — R0602 Shortness of breath: Secondary | ICD-10-CM

## 2020-06-19 DIAGNOSIS — J439 Emphysema, unspecified: Secondary | ICD-10-CM | POA: Diagnosis present

## 2020-06-19 DIAGNOSIS — K449 Diaphragmatic hernia without obstruction or gangrene: Secondary | ICD-10-CM | POA: Diagnosis present

## 2020-06-19 DIAGNOSIS — I248 Other forms of acute ischemic heart disease: Secondary | ICD-10-CM | POA: Diagnosis not present

## 2020-06-19 DIAGNOSIS — Z7901 Long term (current) use of anticoagulants: Secondary | ICD-10-CM

## 2020-06-19 DIAGNOSIS — R52 Pain, unspecified: Secondary | ICD-10-CM

## 2020-06-19 DIAGNOSIS — L8990 Pressure ulcer of unspecified site, unspecified stage: Secondary | ICD-10-CM | POA: Diagnosis present

## 2020-06-19 DIAGNOSIS — F17210 Nicotine dependence, cigarettes, uncomplicated: Secondary | ICD-10-CM | POA: Diagnosis present

## 2020-06-19 DIAGNOSIS — I2510 Atherosclerotic heart disease of native coronary artery without angina pectoris: Secondary | ICD-10-CM | POA: Diagnosis present

## 2020-06-19 DIAGNOSIS — I509 Heart failure, unspecified: Secondary | ICD-10-CM | POA: Diagnosis present

## 2020-06-19 LAB — BASIC METABOLIC PANEL
Anion gap: 8 (ref 5–15)
BUN: 16 mg/dL (ref 8–23)
CO2: 22 mmol/L (ref 22–32)
Calcium: 10.9 mg/dL — ABNORMAL HIGH (ref 8.9–10.3)
Chloride: 92 mmol/L — ABNORMAL LOW (ref 98–111)
Creatinine, Ser: 1.37 mg/dL — ABNORMAL HIGH (ref 0.61–1.24)
GFR calc Af Amer: 57 mL/min — ABNORMAL LOW (ref >60)
GFR calc non Af Amer: 49 mL/min — ABNORMAL LOW (ref >60)
Glucose, Bld: 139 mg/dL — ABNORMAL HIGH (ref 70–99)
Potassium: 4.4 mmol/L (ref 3.5–5.1)
Sodium: 122 mmol/L — ABNORMAL LOW (ref 135–145)

## 2020-06-19 LAB — URINALYSIS, ROUTINE W REFLEX MICROSCOPIC
Bilirubin Urine: NEGATIVE
Glucose, UA: NEGATIVE mg/dL
Hgb urine dipstick: NEGATIVE
Ketones, ur: NEGATIVE mg/dL
Leukocytes,Ua: NEGATIVE
Nitrite: NEGATIVE
Protein, ur: NEGATIVE mg/dL
Specific Gravity, Urine: 1.006 (ref 1.005–1.030)
pH: 6 (ref 5.0–8.0)

## 2020-06-19 LAB — CBC WITH DIFFERENTIAL/PLATELET
Basophils Absolute: 0 10*3/uL (ref 0.0–0.1)
Basophils Relative: 0.4 % (ref 0.0–3.0)
Eosinophils Absolute: 0 10*3/uL (ref 0.0–0.7)
Eosinophils Relative: 0.7 % (ref 0.0–5.0)
HCT: 28.2 % — ABNORMAL LOW (ref 39.0–52.0)
Hemoglobin: 10 g/dL — ABNORMAL LOW (ref 13.0–17.0)
Lymphocytes Relative: 15.9 % (ref 12.0–46.0)
Lymphs Abs: 0.9 10*3/uL (ref 0.7–4.0)
MCHC: 35.4 g/dL (ref 30.0–36.0)
MCV: 96.9 fl (ref 78.0–100.0)
Monocytes Absolute: 0.4 10*3/uL (ref 0.1–1.0)
Monocytes Relative: 7.8 % (ref 3.0–12.0)
Neutro Abs: 4.2 10*3/uL (ref 1.4–7.7)
Neutrophils Relative %: 75.2 % (ref 43.0–77.0)
Platelets: 213 10*3/uL (ref 150.0–400.0)
RBC: 2.9 Mil/uL — ABNORMAL LOW (ref 4.22–5.81)
RDW: 12.5 % (ref 11.5–15.5)
WBC: 5.6 10*3/uL (ref 4.0–10.5)

## 2020-06-19 LAB — COMPREHENSIVE METABOLIC PANEL
ALT: 11 U/L (ref 0–53)
AST: 16 U/L (ref 0–37)
Albumin: 4 g/dL (ref 3.5–5.2)
Alkaline Phosphatase: 77 U/L (ref 39–117)
BUN: 19 mg/dL (ref 6–23)
CO2: 25 mEq/L (ref 19–32)
Calcium: 11.7 mg/dL — ABNORMAL HIGH (ref 8.4–10.5)
Chloride: 89 mEq/L — ABNORMAL LOW (ref 96–112)
Creatinine, Ser: 1.33 mg/dL (ref 0.40–1.50)
GFR: 62.77 mL/min (ref >60.00)
Glucose, Bld: 90 mg/dL (ref 70–99)
Potassium: 4.9 mEq/L (ref 3.5–5.1)
Sodium: 119 mEq/L — CL (ref 135–145)
Total Bilirubin: 0.4 mg/dL (ref 0.2–1.2)
Total Protein: 6.8 g/dL (ref 6.0–8.3)

## 2020-06-19 LAB — CBC
HCT: 26.1 % — ABNORMAL LOW (ref 39.0–52.0)
Hemoglobin: 9.1 g/dL — ABNORMAL LOW (ref 13.0–17.0)
MCH: 33.6 pg (ref 26.0–34.0)
MCHC: 34.9 g/dL (ref 30.0–36.0)
MCV: 96.3 fL (ref 80.0–100.0)
Platelets: 214 10*3/uL (ref 150–400)
RBC: 2.71 MIL/uL — ABNORMAL LOW (ref 4.22–5.81)
RDW: 12 % (ref 11.5–15.5)
WBC: 4.4 10*3/uL (ref 4.0–10.5)
nRBC: 0 % (ref 0.0–0.2)

## 2020-06-19 MED ORDER — SODIUM CHLORIDE 0.9% FLUSH: 3.0000 mL | Freq: Once | INTRAVENOUS | Status: DC

## 2020-06-19 NOTE — Telephone Encounter (Signed)
Patient notified of lab results and instructed to go to the ED for evaluation. He verbalized understanding. He will call a friend to drive him.

## 2020-06-19 NOTE — ED Triage Notes (Signed)
Pt sent by his doctor for low sodium, c/o generalized weakness and reports he is supposed to have a procedure to assess his swallowing on Friday.
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
## 2020-06-19 NOTE — Telephone Encounter (Signed)
Results of critical Na level discussed with Dr. Silverio Decamp.  Patient needs ED visit for Na level 119.  I attempted to reach the patient and there is no answer.  No VM.  No emergency contacts.  I will continue to try and reach the patient.

## 2020-06-19 NOTE — Patient Instructions (Signed)
Your provider has requested that you go to the basement level for lab work before leaving today. Press "B" on the elevator. The lab is located at the first door on the left as you exit the elevator.   You have been scheduled for an endoscopy. Please follow written instructions given to you at your visit today. If you use inhalers (even only as needed), please bring them with you on the day of your procedure.   I appreciate the opportunity to care for you. Silvano Rusk, MD, [facility]

## 2020-06-20 ENCOUNTER — Observation Stay (HOSPITAL_COMMUNITY): Payer: Medicare Other

## 2020-06-20 DIAGNOSIS — R634 Abnormal weight loss: Secondary | ICD-10-CM | POA: Diagnosis not present

## 2020-06-20 DIAGNOSIS — R1310 Dysphagia, unspecified: Secondary | ICD-10-CM | POA: Diagnosis not present

## 2020-06-20 LAB — BASIC METABOLIC PANEL
Anion gap: 5 (ref 5–15)
Anion gap: 4 — ABNORMAL LOW (ref 5–15)
Anion gap: 7 (ref 5–15)
BUN: 18 mg/dL (ref 8–23)
BUN: 17 mg/dL (ref 8–23)
BUN: 17 mg/dL (ref 8–23)
CO2: 24 mmol/L (ref 22–32)
CO2: 24 mmol/L (ref 22–32)
CO2: 23 mmol/L (ref 22–32)
Calcium: 11.2 mg/dL — ABNORMAL HIGH (ref 8.9–10.3)
Calcium: 10.8 mg/dL — ABNORMAL HIGH (ref 8.9–10.3)
Calcium: 11.1 mg/dL — ABNORMAL HIGH (ref 8.9–10.3)
Chloride: 94 mmol/L — ABNORMAL LOW (ref 98–111)
Chloride: 98 mmol/L (ref 98–111)
Chloride: 97 mmol/L — ABNORMAL LOW (ref 98–111)
Creatinine, Ser: 1.23 mg/dL (ref 0.61–1.24)
Creatinine, Ser: 1.13 mg/dL (ref 0.61–1.24)
Creatinine, Ser: 1.11 mg/dL (ref 0.61–1.24)
GFR calc Af Amer: >60 mL/min (ref >60)
GFR calc Af Amer: >60 mL/min (ref >60)
GFR calc Af Amer: >60 mL/min (ref >60)
GFR calc non Af Amer: 56 mL/min — ABNORMAL LOW (ref >60)
GFR calc non Af Amer: >60 mL/min (ref >60)
GFR calc non Af Amer: >60 mL/min (ref >60)
Glucose, Bld: 99 mg/dL (ref 70–99)
Glucose, Bld: 86 mg/dL (ref 70–99)
Glucose, Bld: 121 mg/dL — ABNORMAL HIGH (ref 70–99)
Potassium: 5.1 mmol/L (ref 3.5–5.1)
Potassium: 4.9 mmol/L (ref 3.5–5.1)
Potassium: 4.9 mmol/L (ref 3.5–5.1)
Sodium: 123 mmol/L — ABNORMAL LOW (ref 135–145)
Sodium: 126 mmol/L — ABNORMAL LOW (ref 135–145)
Sodium: 127 mmol/L — ABNORMAL LOW (ref 135–145)

## 2020-06-20 LAB — CBC
HCT: 26.4 % — ABNORMAL LOW (ref 39.0–52.0)
Hemoglobin: 8.9 g/dL — ABNORMAL LOW (ref 13.0–17.0)
MCH: 32.4 pg (ref 26.0–34.0)
MCHC: 33.7 g/dL (ref 30.0–36.0)
MCV: 96 fL (ref 80.0–100.0)
Platelets: 204 10*3/uL (ref 150–400)
RBC: 2.75 MIL/uL — ABNORMAL LOW (ref 4.22–5.81)
RDW: 12.4 % (ref 11.5–15.5)
WBC: 4.1 10*3/uL (ref 4.0–10.5)
nRBC: 0 % (ref 0.0–0.2)

## 2020-06-20 LAB — CORTISOL-AM, BLOOD: Cortisol - AM: 13.1 ug/dL (ref 6.7–22.6)

## 2020-06-20 LAB — OSMOLALITY, URINE: Osmolality, Ur: 212 mOsm/kg — ABNORMAL LOW (ref 300–900)

## 2020-06-20 LAB — SODIUM, URINE, RANDOM: Sodium, Ur: 17 mmol/L

## 2020-06-20 LAB — OSMOLALITY: Osmolality: 265 mOsm/kg — ABNORMAL LOW (ref 275–295)

## 2020-06-20 LAB — SARS CORONAVIRUS 2 BY RT PCR (HOSPITAL ORDER, PERFORMED IN ~~LOC~~ HOSPITAL LAB): SARS Coronavirus 2: NEGATIVE

## 2020-06-20 MED ORDER — ENSURE MAX PROTEIN PO LIQD
11.0000 [oz_av] | Freq: Two times a day (BID) | ORAL | Status: DC
  Administered 2020-06-20 (×2): 11 [oz_av] via ORAL
  Filled 2020-06-20 (×4): qty 330

## 2020-06-20 MED ORDER — PANTOPRAZOLE SODIUM 40 MG PO TBEC
40.0000 mg | DELAYED_RELEASE_TABLET | Freq: Every day | ORAL | Status: DC
  Administered 2020-06-20 – 2020-06-25 (×6): 40 mg via ORAL
  Filled 2020-06-20 (×7): qty 1

## 2020-06-20 MED ORDER — PANTOPRAZOLE SODIUM 40 MG PO TBEC: 40.0000 mg | DELAYED_RELEASE_TABLET | Freq: Every day | ORAL | Status: DC

## 2020-06-20 MED ORDER — PRO-STAT SUGAR FREE PO LIQD
30.0000 mL | Freq: Two times a day (BID) | ORAL | Status: DC
  Administered 2020-06-20 – 2020-06-27 (×7): 30 mL via ORAL
  Filled 2020-06-20 (×18): qty 30

## 2020-06-20 MED ORDER — DRONEDARONE HCL 400 MG PO TABS
400.0000 mg | ORAL_TABLET | Freq: Two times a day (BID) | ORAL | Status: DC
  Administered 2020-06-20 – 2020-06-27 (×12): 400 mg via ORAL
  Filled 2020-06-20 (×17): qty 1

## 2020-06-20 MED ORDER — DABIGATRAN ETEXILATE MESYLATE 75 MG PO CAPS
75.0000 mg | ORAL_CAPSULE | Freq: Two times a day (BID) | ORAL | Status: DC
  Filled 2020-06-20: qty 1

## 2020-06-20 MED ORDER — ATORVASTATIN CALCIUM 10 MG PO TABS
20.0000 mg | ORAL_TABLET | Freq: Every day | ORAL | Status: DC
  Administered 2020-06-20 – 2020-06-27 (×6): 20 mg via ORAL
  Filled 2020-06-20 (×6): qty 2

## 2020-06-20 MED ORDER — METOPROLOL SUCCINATE ER 50 MG PO TB24
50.0000 mg | ORAL_TABLET | Freq: Every day | ORAL | Status: DC
  Administered 2020-06-20 – 2020-06-25 (×5): 50 mg via ORAL
  Filled 2020-06-20 (×4): qty 1
  Filled 2020-06-20: qty 2
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
## 2020-06-20 NOTE — ED Notes (Signed)
[phone] sister, Bethena Roys would like an update

## 2020-06-20 NOTE — ED Notes (Signed)
Called to be roomed, no response.

## 2020-06-20 NOTE — Progress Notes (Addendum)
   Subjective: 79 yo man admitted for hyponatremia. Patient states that he is scheduled for an EGD this Friday for evaluation of dysphagia. He is alert and speaking coherently. Patient has no other complaint.   Objective:  Vital signs in last 24 hours: Vitals:   06/20/20 0815 06/20/20 0901 06/20/20 0945 06/20/20 1119  BP: 129/61 (!) 113/56 110/60 123/86  Pulse: 65 (!) 57 (!) 59 88  Resp: 16 12 17 18   Temp:      TempSrc:      SpO2: 100% 100% 100% 100%   Review of Systems  Constitutional: Positive for malaise/fatigue and weight loss. Negative for fever.  HENT: Negative for hearing loss.   Eyes: Negative for discharge.  Respiratory: Negative for shortness of breath.   Cardiovascular: Negative for chest pain.  Gastrointestinal: Negative for abdominal pain.  Skin: Negative for rash.  Neurological: Negative for focal weakness.  Psychiatric/Behavioral: Negative for memory loss.   Physical Exam Constitutional:      Comments: Frail appearance  HENT:     Head: Normocephalic.  Eyes:     General: No scleral icterus.    Extraocular Movements: Extraocular movements intact.  Cardiovascular:     Rate and Rhythm: Normal rate.     Heart sounds: Normal heart sounds.  Pulmonary:     Breath sounds: Normal breath sounds.  Abdominal:     General: There is no distension.     Tenderness: There is no abdominal tenderness.  Musculoskeletal:     Cervical back: Normal range of motion.  Skin:    General: Skin is warm.  Neurological:     General: No focal deficit present.     Mental Status: He is alert.    Assessment/Plan:  79 year old man with a history of PUD, tobacco use, chronic A. fib, PAD, and HTN admitted for hyponatremia incidentally discovered in anticipation of EGD for odynophagia.  Active Problems:   Hyponatremia- hypotonic, euvolemic hyponatremia.  Likely chronic and multifactorial with limited solute intake, but mildly elevated urine osmolality of 212 suggests some degree of  inappropriate ADH activity. - Na 123 - 126. Continue to monitor for Na BID - Continue fluid restriction -  Encourage patient to intake for Ensure and added Pro stat.  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   Dysphagia/odynophagia - Consult GI for possible inpatient EGD  - Holding Pradaxa for the procedure - Continue PPI for GI prophylaxis    Paroxysmal a-fib  - Heart rate within normal limit - Continue Metoprolol  - Holding Pradaxa for the EGD procedure  DVT proph: SCD  Prior to Admission Living Arrangement: Home Anticipated Discharge Location: Home Barriers to Discharge: Hyponatremia, need for odynophagia work-up Dispo: Anticipated discharge in approximately 2 day(s).   [name] Gerold, MD 06/20/2020, 11:52 AM Pager: @MYPAGER @ After 5pm on weekdays and 1pm on weekends: On Call pager [phone]

## 2020-06-20 NOTE — ED Notes (Signed)
Pt transported to CT ?
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
## 2020-06-20 NOTE — ED Notes (Signed)
Breakfast Ordered 

## 2020-06-21 ENCOUNTER — Other Ambulatory Visit: Payer: Self-pay | Admitting: Physician Assistant

## 2020-06-21 ENCOUNTER — Other Ambulatory Visit: Payer: Self-pay

## 2020-06-21 ENCOUNTER — Observation Stay (HOSPITAL_COMMUNITY): Payer: Medicare Other

## 2020-06-21 ENCOUNTER — Encounter (HOSPITAL_COMMUNITY): Payer: Self-pay | Admitting: Internal Medicine

## 2020-06-21 ENCOUNTER — Encounter (HOSPITAL_COMMUNITY)
Admission: EM | Disposition: A | Discharge status: Home Health | Payer: Self-pay | Source: Home / Self Care | Attending: Internal Medicine

## 2020-06-21 ENCOUNTER — Observation Stay (HOSPITAL_COMMUNITY): Payer: Medicare Other | Admitting: Anesthesiology

## 2020-06-21 ENCOUNTER — Encounter: Payer: Medicare Other | Admitting: Internal Medicine

## 2020-06-21 DIAGNOSIS — E871 Hypo-osmolality and hyponatremia: Secondary | ICD-10-CM | POA: Diagnosis not present

## 2020-06-21 DIAGNOSIS — Z8261 Family history of arthritis: Secondary | ICD-10-CM | POA: Diagnosis not present

## 2020-06-21 DIAGNOSIS — C159 Malignant neoplasm of esophagus, unspecified: Secondary | ICD-10-CM

## 2020-06-21 DIAGNOSIS — K228 Other specified diseases of esophagus: Secondary | ICD-10-CM | POA: Diagnosis not present

## 2020-06-21 DIAGNOSIS — J439 Emphysema, unspecified: Secondary | ICD-10-CM | POA: Diagnosis not present

## 2020-06-21 DIAGNOSIS — K567 Ileus, unspecified: Secondary | ICD-10-CM | POA: Diagnosis not present

## 2020-06-21 DIAGNOSIS — E43 Unspecified severe protein-calorie malnutrition: Secondary | ICD-10-CM | POA: Diagnosis not present

## 2020-06-21 DIAGNOSIS — K3189 Other diseases of stomach and duodenum: Secondary | ICD-10-CM | POA: Diagnosis not present

## 2020-06-21 DIAGNOSIS — Z7189 Other specified counseling: Secondary | ICD-10-CM | POA: Diagnosis not present

## 2020-06-21 DIAGNOSIS — Z66 Do not resuscitate: Secondary | ICD-10-CM | POA: Diagnosis not present

## 2020-06-21 DIAGNOSIS — Z681 Body mass index (BMI) 19 or less, adult: Secondary | ICD-10-CM | POA: Diagnosis not present

## 2020-06-21 DIAGNOSIS — J811 Chronic pulmonary edema: Secondary | ICD-10-CM | POA: Diagnosis not present

## 2020-06-21 DIAGNOSIS — I248 Other forms of acute ischemic heart disease: Secondary | ICD-10-CM | POA: Diagnosis not present

## 2020-06-21 DIAGNOSIS — K6389 Other specified diseases of intestine: Secondary | ICD-10-CM | POA: Diagnosis not present

## 2020-06-21 DIAGNOSIS — I361 Nonrheumatic tricuspid (valve) insufficiency: Secondary | ICD-10-CM | POA: Diagnosis not present

## 2020-06-21 DIAGNOSIS — R1310 Dysphagia, unspecified: Secondary | ICD-10-CM | POA: Diagnosis not present

## 2020-06-21 DIAGNOSIS — G893 Neoplasm related pain (acute) (chronic): Secondary | ICD-10-CM | POA: Diagnosis not present

## 2020-06-21 DIAGNOSIS — R7881 Bacteremia: Secondary | ICD-10-CM | POA: Diagnosis not present

## 2020-06-21 DIAGNOSIS — E785 Hyperlipidemia, unspecified: Secondary | ICD-10-CM | POA: Diagnosis present

## 2020-06-21 DIAGNOSIS — M47815 Spondylosis without myelopathy or radiculopathy, thoracolumbar region: Secondary | ICD-10-CM | POA: Diagnosis present

## 2020-06-21 DIAGNOSIS — R52 Pain, unspecified: Secondary | ICD-10-CM | POA: Diagnosis not present

## 2020-06-21 DIAGNOSIS — I774 Celiac artery compression syndrome: Secondary | ICD-10-CM | POA: Diagnosis not present

## 2020-06-21 DIAGNOSIS — Z20822 Contact with and (suspected) exposure to covid-19: Secondary | ICD-10-CM | POA: Diagnosis not present

## 2020-06-21 DIAGNOSIS — K2970 Gastritis, unspecified, without bleeding: Secondary | ICD-10-CM | POA: Diagnosis not present

## 2020-06-21 DIAGNOSIS — I480 Paroxysmal atrial fibrillation: Secondary | ICD-10-CM

## 2020-06-21 DIAGNOSIS — C154 Malignant neoplasm of middle third of esophagus: Secondary | ICD-10-CM | POA: Diagnosis not present

## 2020-06-21 DIAGNOSIS — I340 Nonrheumatic mitral (valve) insufficiency: Secondary | ICD-10-CM | POA: Diagnosis not present

## 2020-06-21 DIAGNOSIS — Z8249 Family history of ischemic heart disease and other diseases of the circulatory system: Secondary | ICD-10-CM | POA: Diagnosis not present

## 2020-06-21 DIAGNOSIS — D649 Anemia, unspecified: Secondary | ICD-10-CM | POA: Diagnosis not present

## 2020-06-21 DIAGNOSIS — I700 Atherosclerosis of aorta: Secondary | ICD-10-CM | POA: Diagnosis not present

## 2020-06-21 DIAGNOSIS — I739 Peripheral vascular disease, unspecified: Secondary | ICD-10-CM | POA: Diagnosis present

## 2020-06-21 DIAGNOSIS — R350 Frequency of micturition: Secondary | ICD-10-CM | POA: Diagnosis present

## 2020-06-21 DIAGNOSIS — I110 Hypertensive heart disease with heart failure: Secondary | ICD-10-CM | POA: Diagnosis present

## 2020-06-21 DIAGNOSIS — J984 Other disorders of lung: Secondary | ICD-10-CM | POA: Diagnosis not present

## 2020-06-21 DIAGNOSIS — R64 Cachexia: Secondary | ICD-10-CM | POA: Diagnosis not present

## 2020-06-21 DIAGNOSIS — F17210 Nicotine dependence, cigarettes, uncomplicated: Secondary | ICD-10-CM | POA: Diagnosis present

## 2020-06-21 DIAGNOSIS — K449 Diaphragmatic hernia without obstruction or gangrene: Secondary | ICD-10-CM | POA: Diagnosis present

## 2020-06-21 DIAGNOSIS — R634 Abnormal weight loss: Secondary | ICD-10-CM | POA: Diagnosis not present

## 2020-06-21 DIAGNOSIS — R109 Unspecified abdominal pain: Secondary | ICD-10-CM | POA: Diagnosis not present

## 2020-06-21 DIAGNOSIS — I509 Heart failure, unspecified: Secondary | ICD-10-CM | POA: Diagnosis present

## 2020-06-21 DIAGNOSIS — I428 Other cardiomyopathies: Secondary | ICD-10-CM | POA: Diagnosis not present

## 2020-06-21 DIAGNOSIS — Z515 Encounter for palliative care: Secondary | ICD-10-CM | POA: Diagnosis not present

## 2020-06-21 DIAGNOSIS — I708 Atherosclerosis of other arteries: Secondary | ICD-10-CM | POA: Diagnosis not present

## 2020-06-21 DIAGNOSIS — I2510 Atherosclerotic heart disease of native coronary artery without angina pectoris: Secondary | ICD-10-CM | POA: Diagnosis present

## 2020-06-21 DIAGNOSIS — R0902 Hypoxemia: Secondary | ICD-10-CM | POA: Diagnosis not present

## 2020-06-21 DIAGNOSIS — R0602 Shortness of breath: Secondary | ICD-10-CM | POA: Diagnosis not present

## 2020-06-21 DIAGNOSIS — I517 Cardiomegaly: Secondary | ICD-10-CM | POA: Diagnosis not present

## 2020-06-21 DIAGNOSIS — N401 Enlarged prostate with lower urinary tract symptoms: Secondary | ICD-10-CM | POA: Diagnosis not present

## 2020-06-21 DIAGNOSIS — K5730 Diverticulosis of large intestine without perforation or abscess without bleeding: Secondary | ICD-10-CM | POA: Diagnosis not present

## 2020-06-21 DIAGNOSIS — Z888 Allergy status to other drugs, medicaments and biological substances status: Secondary | ICD-10-CM | POA: Diagnosis not present

## 2020-06-21 DIAGNOSIS — J90 Pleural effusion, not elsewhere classified: Secondary | ICD-10-CM | POA: Diagnosis not present

## 2020-06-21 HISTORY — PX: ESOPHAGOGASTRODUODENOSCOPY (EGD) WITH PROPOFOL: SHX5813

## 2020-06-21 HISTORY — PX: BIOPSY: SHX5522

## 2020-06-21 HISTORY — DX: Other specified counseling: Z71.89

## 2020-06-21 HISTORY — DX: Malignant neoplasm of esophagus, unspecified: C15.9

## 2020-06-21 LAB — CBC
HCT: 24.7 % — ABNORMAL LOW (ref 39.0–52.0)
Hemoglobin: 8.5 g/dL — ABNORMAL LOW (ref 13.0–17.0)
MCH: 33.3 pg (ref 26.0–34.0)
MCHC: 34.4 g/dL (ref 30.0–36.0)
MCV: 96.9 fL (ref 80.0–100.0)
Platelets: 202 10*3/uL (ref 150–400)
RBC: 2.55 MIL/uL — ABNORMAL LOW (ref 4.22–5.81)
RDW: 12.6 % (ref 11.5–15.5)
WBC: 5 10*3/uL (ref 4.0–10.5)
nRBC: 0 % (ref 0.0–0.2)

## 2020-06-21 LAB — BASIC METABOLIC PANEL
Anion gap: 6 (ref 5–15)
Anion gap: 5 (ref 5–15)
Anion gap: 9 (ref 5–15)
BUN: 19 mg/dL (ref 8–23)
BUN: 18 mg/dL (ref 8–23)
BUN: 20 mg/dL (ref 8–23)
CO2: 24 mmol/L (ref 22–32)
CO2: 25 mmol/L (ref 22–32)
CO2: 21 mmol/L — ABNORMAL LOW (ref 22–32)
Calcium: 11.1 mg/dL — ABNORMAL HIGH (ref 8.9–10.3)
Calcium: 10.8 mg/dL — ABNORMAL HIGH (ref 8.9–10.3)
Calcium: 11.1 mg/dL — ABNORMAL HIGH (ref 8.9–10.3)
Chloride: 100 mmol/L (ref 98–111)
Chloride: 101 mmol/L (ref 98–111)
Chloride: 100 mmol/L (ref 98–111)
Creatinine, Ser: 1.24 mg/dL (ref 0.61–1.24)
Creatinine, Ser: 1.19 mg/dL (ref 0.61–1.24)
Creatinine, Ser: 1.18 mg/dL (ref 0.61–1.24)
GFR calc Af Amer: >60 mL/min (ref >60)
GFR calc Af Amer: >60 mL/min (ref >60)
GFR calc Af Amer: >60 mL/min (ref >60)
GFR calc non Af Amer: 55 mL/min — ABNORMAL LOW (ref >60)
GFR calc non Af Amer: 58 mL/min — ABNORMAL LOW (ref >60)
GFR calc non Af Amer: 59 mL/min — ABNORMAL LOW (ref >60)
Glucose, Bld: 83 mg/dL (ref 70–99)
Glucose, Bld: 87 mg/dL (ref 70–99)
Glucose, Bld: 194 mg/dL — ABNORMAL HIGH (ref 70–99)
Potassium: 4.7 mmol/L (ref 3.5–5.1)
Potassium: 4.7 mmol/L (ref 3.5–5.1)
Potassium: 4.3 mmol/L (ref 3.5–5.1)
Sodium: 130 mmol/L — ABNORMAL LOW (ref 135–145)
Sodium: 131 mmol/L — ABNORMAL LOW (ref 135–145)
Sodium: 130 mmol/L — ABNORMAL LOW (ref 135–145)

## 2020-06-21 LAB — VITAMIN D 25 HYDROXY (VIT D DEFICIENCY, FRACTURES): Vit D, 25-Hydroxy: 49.51 ng/mL (ref 30–100)

## 2020-06-21 SURGERY — ESOPHAGOGASTRODUODENOSCOPY (EGD) WITH PROPOFOL
Anesthesia: Monitor Anesthesia Care

## 2020-06-21 MED ORDER — PROPOFOL 500 MG/50ML IV EMUL
INTRAVENOUS | Status: DC | PRN
  Administered 2020-06-21: 10 mg via INTRAVENOUS
  Administered 2020-06-21: 75 ug/kg/min via INTRAVENOUS

## 2020-06-21 MED ORDER — ENSURE ENLIVE PO LIQD
237.0000 mL | Freq: Two times a day (BID) | ORAL | Status: DC
  Administered 2020-06-22 – 2020-06-27 (×10): 237 mL via ORAL

## 2020-06-21 MED ORDER — LIDOCAINE HCL (CARDIAC) PF 100 MG/5ML IV SOSY
PREFILLED_SYRINGE | INTRAVENOUS | Status: DC | PRN
Start: 2020-06-21 — End: 2020-06-21
  Administered 2020-06-21: 40 mg via INTRAVENOUS

## 2020-06-21 MED ORDER — ONDANSETRON HCL 4 MG/2ML IJ SOLN
INTRAMUSCULAR | Status: DC | PRN
  Administered 2020-06-21: 4 mg via INTRAVENOUS

## 2020-06-21 MED ORDER — LACTATED RINGERS IV SOLN: INTRAVENOUS | Status: DC

## 2020-06-21 MED ORDER — HYDROMORPHONE HCL 1 MG/ML IJ SOLN
0.5000 mg | Freq: Three times a day (TID) | INTRAMUSCULAR | Status: DC | PRN
  Administered 2020-06-21 – 2020-06-23 (×3): 0.5 mg via INTRAVENOUS
  Filled 2020-06-21 (×4): qty 1

## 2020-06-21 SURGICAL SUPPLY — 15 items

## 2020-06-21 NOTE — TOC Initial Note (Signed)
Transition of Care [facility]) - Initial/Assessment Note    Patient Details  Name: Daniel [name] MRN: [id number] Date of Birth: 22-Apr-1941  [facility] of [facility]) CM/SW Contact:    Marilu Favre, RN Phone Number: 06/21/2020, 1:09 PM  Clinical Narrative:                 Confirmed face sheet information with patient. Patient from home independent , does not use any DME. Has PCP and transportation to appointments.   Expected Discharge Plan: Home/Self Care     Patient Goals and CMS Choice Patient states their goals for this hospitalization and ongoing recovery are:: to return to home CMS Medicare.gov Compare Post Acute Care list provided to:: Patient Choice offered to / list presented to : NA  Expected Discharge Plan and Services Expected Discharge Plan: Home/Self Care       Living arrangements for the past 2 months: Apartment                 DME Arranged: N/A         HH Arranged: NA          Prior Living Arrangements/Services Living arrangements for the past 2 months: Apartment Lives with:: Self Patient language and need for interpreter reviewed:: Yes Do you feel safe going back to the place where you live?: Yes      Need for Family Participation in Patient Care: No (Comment) Care giver support system in place?: Yes (comment)   Criminal Activity/Legal Involvement Pertinent to Current Situation/Hospitalization: No - Comment as needed  Activities of Daily Living      Permission Sought/Granted   Permission granted to share information with : No              Emotional Assessment Appearance:: Appears stated age     Orientation: : Oriented to Self, Oriented to Place, Oriented to  Time, Oriented to Situation Alcohol / Substance Use: Not Applicable Psych Involvement: No (comment)  Admission diagnosis:  Hyponatremia [E87.1] Patient Active Problem List   Diagnosis Date Noted  . Esophageal mass   . Odynophagia 04/15/2020  . Weight loss, unintentional  04/15/2020  . Moderate protein-calorie malnutrition ([place]) 09/30/2018  . Hyponatremia 12/10/2017  . Vitamin B12 deficiency 09/13/2015  . Healthcare maintenance 03/03/2013  . 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 Benign prostatic hyperplasia 03/03/2012  . Peripheral artery disease ([place]) 08/13/2011  . Lung nodule 11/06/2010  . Atrial fibrillation status post cardioversion ([place]) 06/05/2010  . Hyperlipidemia 06/21/2007  . Essential hypertension 05/25/2007  . Tobacco abuse 12/08/2006  . Osteoarthritis of thoracolumbar spine 12/08/2006   PCP:  [name], MD Pharmacy:   [facility], [address] [place] [address] Phone: [phone] Fax: [phone]     Social Determinants of Health (SDOH) Interventions    Readmission Risk Interventions No flowsheet data found.

## 2020-06-21 NOTE — Progress Notes (Signed)
Patient left 5C12 to ENDO for procedure at this time.

## 2020-06-21 NOTE — Progress Notes (Addendum)
   Subjective: Patient examined at bedside. States that he feels better today with more energy.  He lives by himself in senior housing and is able to take care of himself. Denies any recent difficulties in living activities. Patient had an upper EGD done this morning, the results showed a near obstructing mass in the midesophagus 22 cm from the incisors and extends 4-5 cm.  Speaking with patient and his son today at bedside. His son was concerned about his father's condition. I answered all of their questions.   Objective:  Vital signs in last 24 hours: Vitals:   06/21/20 1200 06/21/20 1210 06/21/20 1220 06/21/20 1244  BP: (!) 107/47 (!) 109/43 (!) 113/42 130/70  Pulse: 72 67 64 (!) 58  Resp: 18 (!) 22 18 18   Temp: 97.6 F (36.4 C)   97.7 F (36.5 C)  TempSrc: Axillary   Oral  SpO2: 98% 96% 100% 98%  Weight:      Height:       Physical Exam Constitutional:      Comments: frail  HENT:     Head: Normocephalic.  Eyes:     General: No scleral icterus.    Extraocular Movements: Extraocular movements intact.  Cardiovascular:     Rate and Rhythm: Normal rate and regular rhythm.  Abdominal:     General: Abdomen is flat. Bowel sounds are normal. There is no distension.     Palpations: Abdomen is soft.     Tenderness: There is no abdominal tenderness.  Musculoskeletal:     Right lower leg: No edema.     Left lower leg: No edema.  Skin:    General: Skin is warm.  Psychiatric:        Mood and Affect: Mood normal.     Assessment/Plan:  79 year old man with a history of PUD, tobacco use, chronic A. fib, PAD, and HTN admitted for hyponatremia incidentally discovered in anticipation of EGD for odynophagia. EGD showed an mid-esophageal mass.   Principal Problem:   Hyponatremia Active Problems:   Atrial fibrillation status post cardioversion (HCC)   Lung nodule   Moderate protein-calorie malnutrition (HCC)   Odynophagia   Esophageal mass  Esophageal mass: - EGD showed a  circumferential, near obstructing, ulcerated mass in the midesophagus 22 cm from the incisors and extends 4-5 cm. Awaiting biopsy results. - Oncology Dr. Marin Olp was consulted.  - G-tube placement ordered for additional nutrition.  - Place patient on soft diet - Continue PPI for GI prophylaxis  Hyponatremia - hypotonic, euvolemic hyponatremia.  Likely chronic and multifactorial with limited solute intake, but mildly elevated urine osmolality of 212 suggests some degree of inappropriate ADH activity. - Na 131. Continue to monitor for Na BID - Continue fluid restriction -  Encourage patient to intake more protein. G-tube will be helpful for additional nuttition     Paroxysmal a-fib  - Heart rate within normal limit - Continue Metoprolol  - Holding Pradaxa for G-tube placement  Hypercalcemia -  Ordered ionized Ca, PTH, and vitamin D level  Normocytic anemia - Pending iron studies, ferritin level, B12 and folate level  Diet: soft diet Fluid: LR DVT: SCD   Prior to Admission Living Arrangement: Home Anticipated Discharge Location: Home vs SNF Barriers to Discharge: Evaluation of esophageal mass Dispo: Anticipated discharge in approximately 3-5 day(s).   [name] Gerold, DO 06/21/2020, 4:40 PM Pager: [phone] After 5pm on weekdays and 1pm on weekends: On Call pager [phone]
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
## 2020-06-21 NOTE — Transfer of Care (Signed)
Immediate Anesthesia Transfer of Care Note  Patient: Daniel [name]  Procedure(s) Performed: ESOPHAGOGASTRODUODENOSCOPY (EGD) WITH PROPOFOL (N/A ) BIOPSY  Patient Location: Endoscopy Unit  Anesthesia Type:MAC  Level of Consciousness: awake, alert  and sedated  Airway & Oxygen Therapy: Patient connected to face mask oxygen  Post-op Assessment: Post -op Vital signs reviewed and stable  Post vital signs: stable  Last Vitals:  Vitals Value Taken Time  BP    Temp    Pulse    Resp    SpO2      Last Pain:  Vitals:   06/21/20 0957  TempSrc: Oral  PainSc: 0-No pain         Complications: No complications documented.

## 2020-06-21 NOTE — Anesthesia Procedure Notes (Signed)
Procedure Name: MAC Date/Time: 06/21/2020 12:03 PM Performed by: Lavell Luster, CRNA Pre-anesthesia Checklist: Patient identified, Emergency Drugs available, Suction available, Patient being monitored and Timeout performed Patient Re-evaluated:Patient Re-evaluated prior to induction Oxygen Delivery Method: Nasal cannula Preoxygenation: Pre-oxygenation with 100% oxygen Induction Type: IV induction Placement Confirmation: breath sounds checked- equal and bilateral and positive ETCO2 Dental Injury: Teeth and Oropharynx as per pre-operative assessment

## 2020-06-21 NOTE — Care Management Obs Status (Signed)
[place] NOTIFICATION   Patient Details  Name: Devon Pretty MRN: [id number] Date of Birth: Jul 04, 1941   Medicare Observation Status Notification Given:  Yes    Marilu Favre, RN 06/21/2020, 1:07 PM

## 2020-06-21 NOTE — Anesthesia Preprocedure Evaluation (Signed)
Anesthesia Evaluation  Patient identified by MRN, date of birth, ID band Patient awake    Reviewed: Allergy & Precautions, NPO status , Patient's Chart, lab work & pertinent test results  Airway Mallampati: II  TM Distance: >3 FB Neck ROM: Full    Dental no notable dental hx.    Pulmonary neg pulmonary ROS, Current Smoker and Patient abstained from smoking.,    Pulmonary exam normal breath sounds clear to auscultation       Cardiovascular hypertension, Pt. on medications + Peripheral Vascular Disease  Normal cardiovascular exam Rhythm:Regular Rate:Normal     Neuro/Psych negative neurological ROS  negative psych ROS   GI/Hepatic Neg liver ROS, PUD,   Endo/Other  negative endocrine ROS  Renal/GU negative Renal ROS  negative genitourinary   Musculoskeletal  (+) Arthritis , Osteoarthritis,    Abdominal   Peds negative pediatric ROS (+)  Hematology negative hematology ROS (+)   Anesthesia Other Findings   Reproductive/Obstetrics negative OB ROS                             Anesthesia Physical Anesthesia Plan  ASA: III  Anesthesia Plan: MAC   Post-op Pain Management:    Induction: Intravenous  PONV Risk Score and Plan: 0 and Treatment may vary due to age or medical condition  Airway Management Planned: Nasal Cannula  Additional Equipment:   Intra-op Plan:   Post-operative Plan:   Informed Consent: I have reviewed the patients History and Physical, chart, labs and discussed the procedure including the risks, benefits and alternatives for the proposed anesthesia with the patient or authorized representative who has indicated his/her understanding and acceptance.     Dental advisory given  Plan Discussed with: CRNA  Anesthesia Plan Comments:         Anesthesia Quick Evaluation

## 2020-06-21 NOTE — Interval H&P Note (Signed)
History and Physical Interval Note:  06/21/2020 10:53 AM  Daniel [name]  has presented today for surgery, with the diagnosis of Odynophagia, chest pain, weight loss.  The various methods of treatment have been discussed with the patient and family. After consideration of risks, benefits and other options for treatment, the patient has consented to  Procedure(s): ESOPHAGOGASTRODUODENOSCOPY (EGD) WITH PROPOFOL (N/A) BALLOON DILATION (N/A) as a surgical intervention.  The patient's history has been reviewed, patient examined, no change in status, stable for surgery.  I have reviewed the patient's chart and labs.  Questions were answered to the patient's satisfaction.     Thornton Park

## 2020-06-21 NOTE — Op Note (Addendum)
[facility] Patient Name: Daniel [name] Procedure Date : 06/21/2020 MRN: [id number] Attending MD: Thornton Park MD, MD Date of Birth: 1941/07/29 CSN: [id number] Age: 79 Admit Type: Inpatient Procedure:                Upper GI endoscopy Indications:              Odynophagia Providers:                Thornton Park MD, MD, Clyde Lundborg, RN, Glori Bickers, RN, Laverda Sorenson, Technician, Theodoro Grist,                            CRNA Referring MD:              Medicines:                Monitored Anesthesia Care Complications:            No immediate complications. Estimated blood loss:                            Minimal. Estimated Blood Loss:     Estimated blood loss was minimal. Procedure:                Pre-Anesthesia Assessment:                           - Prior to the procedure, a History and Physical                            was performed, and patient medications and                            allergies were reviewed. The patient's tolerance of                            previous anesthesia was also reviewed. The risks                            and benefits of the procedure and the sedation                            options and risks were discussed with the patient.                            All questions were answered, and informed consent                            was obtained. Prior Anticoagulants: The patient has                            taken Pradaxa (dabigatran), last dose was 5 days                            prior to procedure.  ASA Grade Assessment: III - A                            patient with severe systemic disease. After                            reviewing the risks and benefits, the patient was                            deemed in satisfactory condition to undergo the                            procedure.                           After obtaining informed consent, the endoscope was                            passed  under direct vision. Throughout the                            procedure, the patient's blood pressure, pulse, and                            oxygen saturations were monitored continuously. The                            GIF-H190 ([id number] Olympus gastroscope was                            introduced through the mouth, and advanced to the                            second part of duodenum. The upper GI endoscopy was                            performed with moderate difficulty due to                            narrowing. Successful completion of the procedure                            was aided by withdrawing the scope and replacing                            with the neonatal endoscope. The patient tolerated                            the procedure well. Scope In: Scope Out: Findings:      A circumferential, near obstructing, ulcerated mass was present in the       midesophagus 22 cm from the incisors and extends 4-5 cm. I was unable to       traverse the mass with an adult gastroscope, but, could traverse with a  neonatal scope. The mass was not bleeding but there was evidence for       recent bleeding. Multiple biopsies were taken with a cold forceps for       histology. Estimated blood loss was minimal.      Gastric and duodenal evaluation was limited due to thick secretions that       could not be easily cleared through the neonatal gastroscope. Patchy       minimal inflammation characterized by erythema and granularity was found       in the gastric antrum. Biopsies were taken with a cold forceps for       histology. Estimated blood loss was minimal.      A Hill grade II hiatal hernia was present.      The examined duodenum was normal. 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 Biopsied.                           - Mild gastritis. Biopsied.                           -  Medium-sized hiatal hernia.                           - Normal examined duodenum. Recommendation:           - Return patient to hospital ward for ongoing care.                           - Advance diet as tolerated.                           - Continue present medications. May resume Pradaxa.                           - Await pathology results. [facility]                            interpretation requested.                           - IR consult for gastrostomy tube to allow for                            adequate nutrition.                           - Consider outpatient esophageal stent placement.                            Will discuss with Dr. [name], his primary                            gastroenterologist.                           -  Oncology consult to assist with staging for                            distant mets: would they recommended CT                            chest/abd/pelvis with contrast or PET/CT.                           - Outpatient EUS if no evidence for distance                            metastases.                           The results were reviewed with the patient in the                            Endoscopy recovery area. Daniel [name] asked me not to                            call any family members. 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 All rights reserved. The codes  documented in this report are preliminary and upon coder review may  be revised to meet current compliance requirements. Thornton Park MD, MD 06/21/2020 12:23:05 PM This report has been signed electronically. Number of Addenda: 0

## 2020-06-21 NOTE — Anesthesia Postprocedure Evaluation (Signed)
Anesthesia Post Note  Patient: Daniel [name]  Procedure(s) Performed: ESOPHAGOGASTRODUODENOSCOPY (EGD) WITH PROPOFOL (N/A ) BIOPSY     Patient location during evaluation: Endoscopy Anesthesia Type: MAC Level of consciousness: awake and alert Pain management: pain level controlled Vital Signs Assessment: post-procedure vital signs reviewed and stable Respiratory status: spontaneous breathing, nonlabored ventilation and respiratory function stable Cardiovascular status: blood pressure returned to baseline and stable Postop Assessment: no apparent nausea or vomiting Anesthetic complications: no   No complications documented.  Last Vitals:  Vitals:   06/21/20 1210 06/21/20 1220  BP: (!) 109/43 (!) 113/42  Pulse: 67 64  Resp: (!) 22 18  Temp:    SpO2: 96% 100%    Last Pain:  Vitals:   06/21/20 1220  TempSrc:   PainSc: 0-No pain                 Lynda Rainwater

## 2020-06-21 NOTE — Progress Notes (Signed)
Initial Nutrition Assessment  DOCUMENTATION CODES:   Underweight  INTERVENTION:  Once diet advances,   Continue Ensure Max po BID, each supplement provides 150 kcal and 30 grams of protein.   Continue 30 ml Prostat po BID, each supplement provides 100 kcal and 15 grams of protein.   NUTRITION DIAGNOSIS:   Inadequate oral intake related to inability to eat as evidenced by NPO status.  GOAL:   Patient will meet greater than or equal to 90% of their needs  MONITOR:   Diet advancement, Skin, Weight trends, Labs, I & O's, Supplement acceptance  REASON FOR ASSESSMENT:   Consult Assessment of nutrition requirement/status  ASSESSMENT:   79 year old man with a history of PUD, tobacco use, chronic A. fib, PAD, and HTN admitted for hyponatremia with plans for EGD for odynophagia.   Pt unavailable during time of contact. Pt underwent EGD and biopsy today. RD unable to obtain pt nutrition history at this time. Pt currently has Ensure and Prostat ordered and has been consuming them prior to procedure and NPO status. RD to continue with current orders to aid in caloric and protein needs.   Unable to complete Nutrition-Focused physical exam at this time.   Labs and medications reviewed.   Diet Order:   Diet Order    None      EDUCATION NEEDS:   Not appropriate for education at this time  Skin:  Skin Assessment: Reviewed RN Assessment  Last BM:  Unknown  Height:   Ht Readings from Last 1 Encounters:  06/21/20 5\' 9"  (1.753 m)    Weight:   Wt Readings from Last 1 Encounters:  06/21/20 42.6 kg    Ideal Body Weight:  72.7 kg  BMI:  Body mass index is 13.88 kg/m.  Estimated Nutritional Needs:   Kcal:  0347-4259  Protein:  85-95 grams  Fluid:  >/= 1.7 L/day  Corrin Parker, MS, RD, LDN RD pager number/after hours weekend pager number on Amion.

## 2020-06-21 NOTE — Progress Notes (Signed)
RN updated the POC with patient/family.Patient's family at bedside very upset, requesting to speak to MD regarding status of tube feeding. Dr. Allyson Sabal will follow up.

## 2020-06-21 NOTE — Progress Notes (Signed)
MD notified of patient not liking "Ensure Max" and prefer regular room temperature "vanilla ensure"

## 2020-06-21 NOTE — Progress Notes (Signed)
Daily Rounding Note  06/21/2020, 9:50 AM  LOS: 0 days   SUBJECTIVE:   Chief complaint:  Non-cardiac sternal discomfort, exacerbated by solid po   No complaints.     OBJECTIVE:         Vital signs in last 24 hours:    Temp:  [98 F (36.7 C)-100.3 F (37.9 C)] 98 F (36.7 C) (07/02 0612) Pulse Rate:  [56-88] 61 (07/02 0612) Resp:  [16-18] 16 (07/02 0612) BP: (112-123)/(52-86) 115/61 (07/02 0612) SpO2:  [98 %-100 %] 99 % (07/02 0612)   There were no vitals filed for this visit. General: thin, cachectic looking.  Comfortable, NAD Pt seen but not physically re-examined Heart: RRR Chest: no labored breathing or cough Abdomen: thin  Extremities: thin UE and LE Neuro/Psych:  Animated, alert.  Appropriate.  Moves all 4 limbs.    Intake/Output from previous day: No intake/output data recorded.  Intake/Output this shift: Total I/O In: 0  Out: 200 [Urine:200]  Lab Results: Recent Labs    06/19/20 1733 06/20/20 0446 06/21/20 0822  WBC 4.4 4.1 5.0  HGB 9.1* 8.9* 8.5*  HCT 26.1* 26.4* 24.7*  PLT 214 204 202   BMET Recent Labs    06/20/20 0900 06/20/20 1809 06/21/20 0822  NA 126* 127* 130*  K 4.9 4.9 4.7  CL 98 97* 100  CO2 24 23 24   GLUCOSE 86 121* 83  BUN 17 17 19   CREATININE 1.13 1.11 1.24  CALCIUM 10.8* 11.1* 11.1*   LFT Recent Labs    06/19/20 1241  PROT 6.8  ALBUMIN 4.0  AST 16  ALT 11  ALKPHOS 77  BILITOT 0.4    Studies/Results: CT CHEST WO CONTRAST  Result Date: 06/20/2020 CLINICAL DATA:  Chest pain. Hyponatremia. EXAM: CT CHEST WITHOUT CONTRAST TECHNIQUE: Multidetector CT imaging of the chest was performed following the standard protocol without IV contrast. COMPARISON:  03/20/2013 FINDINGS: Cardiovascular: The heart is normal in size. Small amount of pericardial fluid but no overt effusion. The thoracic aorta is normal in caliber. Moderate atherosclerotic calcifications. Three-vessel  coronary artery calcifications. Mediastinum/Nodes: No mediastinal or hilar mass or adenopathy. The esophagus is grossly normal. Lungs/Pleura: Advanced emphysematous changes and areas of pulmonary scarring. Stable appearing biapical pleural and parenchymal scarring. No worrisome pulmonary lesions. 10.5 mm smoothly marginated well circumscribed and slightly lobulated nodule is noted in left lower lobe on image 121/3. This was also present on the prior chest CT from 2014 where it measured a maximum of 8 mm. It demonstrates low attenuation and likely contains some macroscopic fat. 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 Upper Abdomen: No significant upper abdominal findings. Advanced vascular calcifications are noted. Scattered hepatic cysts. Musculoskeletal: No significant bony findings. IMPRESSION: 1. Advanced emphysematous changes and areas of pulmonary scarring. 2. No acute pulmonary findings or worrisome pulmonary lesions. 3. 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 4. No mediastinal or hilar mass or adenopathy. 5. Aortic and Three-vessel coronary artery calcifications. 6. Emphysema and aortic atherosclerosis. Aortic Atherosclerosis (ICD10-I70.0) and Emphysema (ICD10-J43.9). Aortic Atherosclerosis (ICD10-I70.0) and Emphysema (ICD10-J43.9). Electronically Signed   By: Marijo Sanes M.D.   On: 06/20/2020 09:45    ASSESMENT:   *   Chest pain, odynophagia  *   Hyponatremia.  Hypotonic, euvolemic. 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   Improved.     *   Normocytic anemia  PLAN   *  EGD w possible esoph dilation today 1130 AM.      Azucena Freed  06/21/2020, 9:50 AM Phone [phone]

## 2020-06-21 NOTE — Progress Notes (Signed)
Patient returned to room from endo. VSS. Request for warm water and percocet. Pt declined AM meds and request for TELE monitor to be removed. MD paged and notified.

## 2020-06-21 NOTE — Progress Notes (Signed)
As RN reviewing pt's consent form. PT stated " I think I may change my mind"  Consent will need to be sign in pre-op r/t ot is having second thoughts and additional questions for MD.
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
## 2020-06-22 ENCOUNTER — Inpatient Hospital Stay (HOSPITAL_COMMUNITY): Payer: Medicare Other

## 2020-06-22 DIAGNOSIS — K228 Other specified diseases of esophagus: Secondary | ICD-10-CM

## 2020-06-22 LAB — CBC
HCT: 26.9 % — ABNORMAL LOW (ref 39.0–52.0)
Hemoglobin: 9.1 g/dL — ABNORMAL LOW (ref 13.0–17.0)
MCH: 33.1 pg (ref 26.0–34.0)
MCHC: 33.8 g/dL (ref 30.0–36.0)
MCV: 97.8 fL (ref 80.0–100.0)
Platelets: 202 10*3/uL (ref 150–400)
RBC: 2.75 MIL/uL — ABNORMAL LOW (ref 4.22–5.81)
RDW: 12.7 % (ref 11.5–15.5)
WBC: 8 10*3/uL (ref 4.0–10.5)
nRBC: 0 % (ref 0.0–0.2)

## 2020-06-22 LAB — IRON AND TIBC
Iron: 13 ug/dL — ABNORMAL LOW (ref 45–182)
Saturation Ratios: 5 % — ABNORMAL LOW (ref 17.9–39.5)
TIBC: 272 ug/dL (ref 250–450)
UIBC: 259 ug/dL

## 2020-06-22 LAB — RETICULOCYTES
Immature Retic Fract: 6.9 % (ref 2.3–15.9)
RBC.: 2.74 MIL/uL — ABNORMAL LOW (ref 4.22–5.81)
Retic Count, Absolute: 39.2 10*3/uL (ref 19.0–186.0)
Retic Ct Pct: 1.4 % (ref 0.4–3.1)

## 2020-06-22 LAB — FOLATE: Folate: 12.2 ng/mL (ref >5.9)

## 2020-06-22 LAB — PREALBUMIN: Prealbumin: 13.8 mg/dL — ABNORMAL LOW (ref 18–38)

## 2020-06-22 LAB — FERRITIN: Ferritin: 131 ng/mL (ref 24–336)

## 2020-06-22 LAB — PARATHYROID HORMONE, INTACT (NO CA): PTH: 43 pg/mL (ref 15–65)

## 2020-06-22 LAB — VITAMIN B12: Vitamin B-12: 508 pg/mL (ref 180–914)

## 2020-06-22 MED ORDER — IOHEXOL 300 MG/ML  SOLN
100.0000 mL | Freq: Once | INTRAMUSCULAR | Status: AC | PRN
  Administered 2020-06-22: 100 mL via INTRAVENOUS

## 2020-06-22 NOTE — Progress Notes (Addendum)
° ° ° °  [facility] Progress Note  I spoke with Dr. Melina Copa in pathology this morning. She confirmed that esophageal biopsy results will be available 06/25/20. IR to place gastrostomy tube. Oncology consultation appreciated. Consult note reviewed.  Evaluation for metastatic disease underway.  We will follow-up after CT results are available. Will plan EUS if CT shows no evidence for metastatic disease and the results of EUS will change his treatment.

## 2020-06-22 NOTE — Progress Notes (Signed)
   Subjective: HD#1   Overnight: He underwent EGD  Today, Daniel [name] was examined at bedside and he states that he is doing well.  He complains of chronic intermittent chest discomfort which is relieved when he takes ibuprofen at home.  Otherwise he denies any other symptoms.  He tolerated his EGD well yesterday and does not report of nausea, vomiting.  He has been able to update his family regarding the findings.  Objective:  Vital signs in last 24 hours: Vitals:   06/21/20 1220 06/21/20 1244 06/21/20 1836 06/21/20 2340  BP: (!) 113/42 130/70 (!) 120/52 (!) 125/55  Pulse: 64 (!) 58 76 79  Resp: 18 18 16 16   Temp:  97.7 F (36.5 C) 98.3 F (36.8 C) 100.1 F (37.8 C)  TempSrc:  Oral Oral Oral  SpO2: 100% 98% 100% 100%  Weight:      Height:       Const: In no apparent distress, cachectic, lying comfortably in bed CV: RRR, no murmurs, gallop, rub Abd: Bowel sounds present, nondistended, nontender to palpation   Assessment/Plan:  Principal Problem:   Hyponatremia Active Problems:   Atrial fibrillation status post cardioversion (HCC)   Lung nodule   Moderate protein-calorie malnutrition (HCC)   Odynophagia   Goals of care, counseling/discussion   Esophageal cancer, stage IIB ([place])  79 year old man with a history of PUD, tobacco use, chronic A. fib, PAD, and HTN who presented after being found to have hyponatremia on outpatient labs.  He also complained of odontophagia for which an EGD was performed and was discovered to have a mid esophageal mass   Mid esophageal mass concerning for malignancy: - EGD performed on 06/21/2020 showed a circumferential, near obstructing, ulcerated mass in the midesophagus 22 cm from the incisors and extends 4-5 cm. Awaiting biopsy results. 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 -Absolute reticulocyte count of 0.9 -Vitamin V37, folic acid unremarkable -Follow-up daily CBC   Hyponatremia -hypotonic, euvolemic hyponatremia. Likely chronic and multifactorial with limited solute intake, but mildly elevated urine osmolality of 212 suggests some degree of inappropriate ADH activity. -Follow BMP   Paroxysmal a-fib  - Heart rate within normal limit - Continue Metoprolol  - Holding Pradaxa in anticipation for G-tube placement   Hypercalcemia -Vitamin D level unremarkable -Follow-up ionized calcium, PTH   FEN: Soft diet VTE ppx: SCDs CODE STATUS: DNR  Prior to Admission Living Arrangement: Home, Senior-assisted living facility Anticipated Discharge Location: Senior-assisted living facility Barriers to Discharge: Ongoing medical work-up Dispo: Anticipated discharge in approximately 3-4 day(s).    [name], MD 06/22/2020, 6:22 AM Pager: [phone] Internal Medicine Teaching Service After 5pm on weekdays and 1pm on weekends: On Call pager: [phone]

## 2020-06-23 LAB — BASIC METABOLIC PANEL
Anion gap: 6 (ref 5–15)
BUN: 39 mg/dL — ABNORMAL HIGH (ref 8–23)
CO2: 26 mmol/L (ref 22–32)
Calcium: 10.9 mg/dL — ABNORMAL HIGH (ref 8.9–10.3)
Chloride: 105 mmol/L (ref 98–111)
Creatinine, Ser: 1.2 mg/dL (ref 0.61–1.24)
GFR calc Af Amer: >60 mL/min (ref >60)
GFR calc non Af Amer: 58 mL/min — ABNORMAL LOW (ref >60)
Glucose, Bld: 131 mg/dL — ABNORMAL HIGH (ref 70–99)
Potassium: 4.7 mmol/L (ref 3.5–5.1)
Sodium: 137 mmol/L (ref 135–145)

## 2020-06-23 LAB — CBC
HCT: 24.8 % — ABNORMAL LOW (ref 39.0–52.0)
Hemoglobin: 8.4 g/dL — ABNORMAL LOW (ref 13.0–17.0)
MCH: 33.2 pg (ref 26.0–34.0)
MCHC: 33.9 g/dL (ref 30.0–36.0)
MCV: 98 fL (ref 80.0–100.0)
Platelets: 209 10*3/uL (ref 150–400)
RBC: 2.53 MIL/uL — ABNORMAL LOW (ref 4.22–5.81)
RDW: 12.9 % (ref 11.5–15.5)
WBC: 9 10*3/uL (ref 4.0–10.5)
nRBC: 0 % (ref 0.0–0.2)

## 2020-06-23 LAB — CALCIUM, IONIZED: Calcium, Ionized, Serum: 6.7 mg/dL — ABNORMAL HIGH (ref 4.5–5.6)

## 2020-06-23 MED ORDER — CEFAZOLIN SODIUM-DEXTROSE 2-4 GM/100ML-% IV SOLN: 2.0000 g | INTRAVENOUS | Status: AC

## 2020-06-23 NOTE — Plan of Care (Signed)
  Problem: Clinical Measurements: Goal: Ability to maintain clinical measurements within normal limits will improve Outcome: Not Progressing   Problem: Nutrition: Goal: Adequate nutrition will be maintained Outcome: Not Progressing   

## 2020-06-23 NOTE — Progress Notes (Addendum)
° °  Subjective: HD#2   Overnight: No acute events reported  Today, Daniel [name] is doing well and denies abdominal pain, nausea, vomiting.  He is well aware of the plan to place PEG tube this week and also further evaluation with EUS.  Objective:  Vital signs in last 24 hours: Vitals:   06/22/20 1240 06/22/20 1745 06/22/20 2313 06/23/20 0505  BP: (!) 104/52 105/60 102/63 (!) 115/59  Pulse: 74 77 67 96  Resp: 17  16 18   Temp: 99.1 F (37.3 C) 98.9 F (37.2 C) 98.7 F (37.1 C) 98.5 F (36.9 C)  TempSrc: Oral Oral Oral Oral  SpO2: 100% 100% 98% 100%  Weight:      Height:       Const: In no apparent distress, lying comfortably in bed, conversational, cachectic CV: RRR, no murmurs, gallop, rub Abd: Bowel sounds present, nondistended, nontender to palpation    Assessment/Plan:  Principal Problem:   Hyponatremia Active Problems:   Atrial fibrillation status post cardioversion (HCC)   Lung nodule   Moderate protein-calorie malnutrition (HCC)   Odynophagia   Goals of care, counseling/discussion   Esophageal cancer, stage IIB (Daniel [name])   79 year old man with a history of PUD, tobacco use, chronic A. fib, PAD, and HTN who presented after being found to have hyponatremia on outpatient labs.  He also complained of odontophagia for which an EGD was performed and was discovered to have a mid esophageal mass   Mid esophageal mass concerning for malignancy: - EGD performed on 06/21/2020 showed acircumferential, near obstructing, ulcerated mass in the midesophagus 22 cm from the incisors and extends 4-5 cm.Awaiting biopsy results. 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 Likely chronic and multifactorial with limited solute intake, but mildly elevated urine osmolality of 212 suggests some degree of inappropriate ADH activity. - Continue Ensure, prostat - Follow BMP closely. If sNa > 138 mmol/L, will give free water to keep sNa <137   Paroxysmal a-fib  - Heart rate within normal limit - Continue Metoprolol  - Holding Pradaxa in anticipation forG-tube placement   Hypercalcemia -Vitamin D, PTH unremarkable -Follow-up ionized calcium   FEN: Soft diet VTE ppx: SCDs CODE STATUS: DNR  Prior to Admission Living Arrangement: Home, Senior-assisted living facility Anticipated Discharge Location: Senior-assisted living facility Barriers to Discharge: Ongoing medical work-up Dispo: Anticipated discharge in approximately 3-4 day(s).    Daniel Rosenthal, MD 06/23/2020, 6:14 AM Pager: [phone] Internal Medicine Teaching Service After 5pm on weekdays and 1pm on weekends: On Call pager: [phone]

## 2020-06-23 NOTE — Progress Notes (Signed)
Progress Note CC:   Chest pain       ASSESSMENT AND PLAN:   # Esophageal mass, nearly obstructing and malignant appearing --biopsies pending --no evidence for mets on staging CT scans --Will need outpatient EUS. I will send information to Dr. Ardis Hughs in our practice        06/21/20 EGD Near obstructing, malignant-appearing esophageal tumor was found in the mid esophagus. Biopsied. - Mild gastritis. Biopsied. - Medium-sized hiatal hernia. - Normal examined duodenum.   SUBJECTIVE   No complaints. Drinking Ensure but no eating much.     OBJECTIVE:     Vital signs in last 24 hours: Temp:  [98.5 F (36.9 C)-99.1 F (37.3 C)] 98.5 F (36.9 C) (07/04 0505) Pulse Rate:  [67-96] 96 (07/04 0505) Resp:  [16-18] 18 (07/04 0505) BP: (102-115)/(52-63) 115/59 (07/04 0505) SpO2:  [98 %-100 %] 100 % (07/04 0505)   General:   Alert, thin male in NAD Heart:  Regular rate and rhythm.  No lower extremity edema   Pulm: Normal respiratory effort   Abdomen:  Soft,  nontender, nondistended.  Normal bowel sounds.          Neurologic:  Alert and  oriented,  grossly normal neurologically. Psych:  Pleasant, cooperative.  Normal mood and affect.   Intake/Output from previous day: 07/03 0701 - 07/04 0700 In: 250 [P.O.:250] Out: 400 [Urine:400] Intake/Output this shift: No intake/output data recorded.  Lab Results: Recent Labs    06/21/20 0822 06/22/20 0738 06/23/20 0234  WBC 5.0 8.0 9.0  HGB 8.5* 9.1* 8.4*  HCT 24.7* 26.9* 24.8*  PLT 202 202 209   BMET Recent Labs    06/21/20 1401 06/21/20 1839 06/23/20 0234  NA 131* 130* 137  K 4.7 4.3 4.7  CL 101 100 105  CO2 25 21* 26  GLUCOSE 87 194* 131*  BUN 18 20 39*  CREATININE 1.19 1.18 1.20  CALCIUM 10.8* 11.1* 10.9*   LFT No results for input(s): PROT, ALBUMIN, AST, ALT, ALKPHOS, BILITOT, BILIDIR, IBILI in the last 72 hours. PT/INR No results for input(s): LABPROT, INR in the last 72 hours. Hepatitis Panel No  results for input(s): HEPBSAG, HCVAB, HEPAIGM, HEPBIGM in the last 72 hours.  CT HEAD W & WO CONTRAST  Result Date: 06/22/2020 CLINICAL DATA:  79 year old male with recently diagnosed ulcerated esophageal tumor. Staging. EXAM: CT HEAD WITHOUT AND WITH CONTRAST TECHNIQUE: Contiguous axial images were obtained from the base of the skull through the vertex without and with intravenous contrast CONTRAST:  164mL OMNIPAQUE IOHEXOL 300 MG/ML  SOLN COMPARISON:  Brain MRI 01/11/2014. FINDINGS: Brain: Cerebral volume appears stable since 2015. Mild for age patchy bilateral cerebral white matter hypodensity. Otherwise normal gray-white matter differentiation. No midline shift, ventriculomegaly, mass effect, evidence of mass lesion, intracranial hemorrhage or evidence of cortically based acute infarction. No abnormal enhancement identified. Vascular: Calcified atherosclerosis at the skull base. The major intracranial vascular structures are enhancing and appear to be patent. Skull: Negative. Sinuses/Orbits: Visualized paranasal sinuses and mastoids are clear. Other: No acute orbit or scalp soft tissue findings. IMPRESSION: No metastatic disease or acute intracranial abnormality. Largely unremarkable for age CT appearance of the brain Electronically Signed   By: Genevie Ann M.D.   On: 06/22/2020 11:50   CT ABDOMEN PELVIS W CONTRAST  Result Date: 06/22/2020 CLINICAL DATA:  Initial workup of newly diagnosed esophageal cancer. Esophageal mass concerning for malignancy, with circumferential near obstructing ulcerative mass in the mid esophagus. EXAM: CT ABDOMEN  AND PELVIS WITH CONTRAST TECHNIQUE: Multidetector CT imaging of the abdomen and pelvis was performed using the standard protocol following bolus administration of intravenous contrast. CONTRAST:  17mL OMNIPAQUE IOHEXOL 300 MG/ML  SOLN COMPARISON:  06/20/2020, 11/05/2010 FINDINGS: Lower chest: Stable 1.0 centimeter soft tissue mass in the LEFT lung base, consistent with  hamartoma. Paraseptal emphysematous changes at the lung bases. Heart size is normal. Hepatobiliary: Small cyst identified within the LEFT hepatic lobe are stable. No suspicious liver lesions. Gallbladder is present. Pancreas: Unremarkable. No pancreatic ductal dilatation or surrounding inflammatory changes. Spleen: Stable comments densely calcified oval mass along the MEDIAL aspect of the spleen is 3.4 x 2.9 centimeters. Otherwise the spleen is unremarkable. Adrenals/Urinary Tract: Normal adrenal glands. Small low-attenuation lesions within the kidneys are consistent with small cysts. There is no hydronephrosis. Ureters are unremarkable. The bladder and visualized portion of the urethra are normal. Stomach/Bowel: Stomach is normal in appearance. Small bowel loops are normal in caliber and wall thickness. Colon is unremarkable. Vascular/Lymphatic: There is dense atherosclerotic calcification of the abdominal aorta and its branches. No associated aneurysm. There is normal vascular opacification of the celiac axis, superior mesenteric artery, and inferior mesenteric artery. Normal appearance of the portal venous system and inferior vena cava. No retroperitoneal or mesenteric adenopathy. Reproductive: Prostate gland is prominent. Other: Anterior abdominal wall is unremarkable.  No ascites. Musculoskeletal: Degenerative changes are seen in the LOWER lumbar spine, with facet hypertrophy and uncovertebral spurring at L3-4. IMPRESSION: 1. No evidence for acute abnormality of the abdomen or pelvis. 2. Small hepatic cysts.  No evidence for metastatic disease. 3. Stable appearance of LEFT lung base nodule. 4. Stable appearance of hepatic and renal cysts. 5. Chronic densely calcified splenic mass, consistent with benign process. 6. Prominent prostate gland. 7. Aortic Atherosclerosis (ICD10-I70.0). Electronically Signed   By: Nolon Nations M.D.   On: 06/22/2020 12:03      Principal Problem:   Hyponatremia Active  Problems:   Atrial fibrillation status post cardioversion [facility])   Lung nodule   Moderate protein-calorie malnutrition (HCC)   Odynophagia   Goals of care, counseling/discussion   Esophageal cancer, stage IIB ([place])     LOS: 2 days   Tye Savoy ,NP 06/23/2020, 12:09 PM                        Near obstructing, malignant-appearing esophageal tumor was found in the mid esophagus. Biopsied. - Mild gastritis. Biopsied. - Medium-sized hiatal hernia. - Normal examined duodenum.

## 2020-06-23 NOTE — Progress Notes (Signed)
Patient ID: Daniel [name], male   DOB: April 08, 1941, 79 y.o.   MRN: [id number] Patient tentatively scheduled for gastrostomy tube placement on 7/6.  See consult note from 7/2 for additional details.  Risks and benefits image guided gastrostomy tube placement was discussed with the patient including, but not limited to the need for a barium enema during the procedure, bleeding, infection, peritonitis and/or damage to adjacent structures.  All of the patient's questions were answered, patient is agreeable to proceed.  Consent signed and in chart.  Pradaxa has been held.  Check labs on 7/6.

## 2020-06-24 ENCOUNTER — Encounter (HOSPITAL_COMMUNITY): Payer: Self-pay | Admitting: Gastroenterology

## 2020-06-24 LAB — CBC
HCT: 24.7 % — ABNORMAL LOW (ref 39.0–52.0)
Hemoglobin: 8 g/dL — ABNORMAL LOW (ref 13.0–17.0)
MCH: 32.1 pg (ref 26.0–34.0)
MCHC: 32.4 g/dL (ref 30.0–36.0)
MCV: 99.2 fL (ref 80.0–100.0)
Platelets: 205 10*3/uL (ref 150–400)
RBC: 2.49 MIL/uL — ABNORMAL LOW (ref 4.22–5.81)
RDW: 13.1 % (ref 11.5–15.5)
WBC: 6.9 10*3/uL (ref 4.0–10.5)
nRBC: 0 % (ref 0.0–0.2)

## 2020-06-24 LAB — BASIC METABOLIC PANEL
Anion gap: 8 (ref 5–15)
BUN: 36 mg/dL — ABNORMAL HIGH (ref 8–23)
CO2: 26 mmol/L (ref 22–32)
Calcium: 11.1 mg/dL — ABNORMAL HIGH (ref 8.9–10.3)
Chloride: 103 mmol/L (ref 98–111)
Creatinine, Ser: 1.22 mg/dL (ref 0.61–1.24)
GFR calc Af Amer: >60 mL/min (ref >60)
GFR calc non Af Amer: 56 mL/min — ABNORMAL LOW (ref >60)
Glucose, Bld: 117 mg/dL — ABNORMAL HIGH (ref 70–99)
Potassium: 3.9 mmol/L (ref 3.5–5.1)
Sodium: 137 mmol/L (ref 135–145)

## 2020-06-24 MED ORDER — OXYCODONE-ACETAMINOPHEN 5-325 MG PO TABS
1.0000 | ORAL_TABLET | Freq: Three times a day (TID) | ORAL | Status: DC | PRN
  Administered 2020-06-24 – 2020-06-25 (×3): 1 via ORAL
  Filled 2020-06-24 (×3): qty 1

## 2020-06-24 NOTE — Progress Notes (Addendum)
Subjective: Patient examined at bedside. Reports poor sleep last night. States he has had continued difficulty eating, but has been drinking his ensures and drinking fluids. He notes pain to his left chest but not painful to palpation. Discussed his feeding tube placement scheduled tomorrow, patient voices understanding.  Reports of esophageal pain. He used ibuprofen at home, as well as an unknown pill given to him by a friend which he states controlled his pain very well.  Objective:  Vital signs in last 24 hours: Vitals:   06/23/20 1228 06/23/20 1754 06/23/20 2346 06/24/20 0409  BP: (!) 107/55 (!) 118/58 (!) 106/56 122/60  Pulse: 63 62 65 66  Resp: 18 18 16 18   Temp: (!) 97.4 F (36.3 C) 98.4 F (36.9 C) 98.4 F (36.9 C) 97.7 F (36.5 C)  TempSrc: Oral Oral Oral Oral  SpO2: 100% 100% 100% 100%  Weight:      Height:       Physical Exam Constitutional:      Comments: frail  Eyes:     Extraocular Movements: Extraocular movements intact.  Cardiovascular:     Rate and Rhythm: Normal rate and regular rhythm.     Heart sounds: Normal heart sounds.  Pulmonary:     Breath sounds: Normal breath sounds. No wheezing.  Abdominal:     General: Abdomen is flat. Bowel sounds are normal.     Palpations: Abdomen is soft.  Musculoskeletal:     Right lower leg: No edema.     Left lower leg: No edema.  Skin:    General: Skin is warm.  Neurological:     Mental Status: He is alert.  Psychiatric:        Mood and Affect: Mood normal.     Assessment/Plan:  79 year old man with a history of PUD, tobacco use, chronic A. fib, PAD, and HTN admitted for hyponatremia incidentally discovered in anticipation of EGD for odynophagia. EGD showed an mid-esophageal mass.  Principal Problem:   Hyponatremia Active Problems:   Atrial fibrillation status post cardioversion (HCC)   Lung nodule   Moderate protein-calorie malnutrition (HCC)   Odynophagia   Goals of care, counseling/discussion    Esophageal cancer, stage IIB (HCC)  Esophageal mass: - EGD showed a circumferential, near obstructing, ulcerated mass in the midesophagus 22 cm from the incisors and extends 4-5 cm. Awaiting biopsy results. - Oncology Dr. Marin Olp consulted. CT head, abdomen and pelvis shows no metastatic findings - G-tube placement planned tomorrow  - Place patient on soft diet - Continue PPI for GI prophylaxis - Percocet PRN for pain  - GI recommend EUS outpatient procedure    Hypercalcemia - Inappropriately Normal PTH  - Normal vitamin D level - Elevated ionized calcium level - Pending PTHr peptide level  Malnutrition - Due to dysphagia and low low solute intake - Pre-albumin of 13.8 - Encourage patient to intake more protein. G-tube will be helpful for additional nutrition.  Hyponatremia- reslved - hypotonic, euvolemic hyponatremia.  Likely chronic and multifactorial with limited solute intake, but mildly elevated urine osmolality of 212 suggests some degree of inappropriate ADH activity. - Na 137 -  Encourage patient to intake more protein. G-tube will be helpful for additional nutrition. - Continue to monitor for Na BID     Paroxysmal a-fib  - Heart rate within normal limit - Continue Metoprolol  - Holding Pradaxa for G-tube placement    Normocytic anemia - Iron studies revealed ferritin 131, TIBC normal at 272, iron saturation 5, iron 13.  Vitamin O70, folic acid unremarkable. Combination of iron deficiency anemia and inflammation, secondary to ulcerated esophageal mass.  - Continue CBC daily.    Diet: soft diet Fluid: LR DVT: SCD Code: DNR Prior to Admission Living Arrangement: home Anticipated Discharge Location: home vs SNF Barriers to Discharge: G-tube placement and teaching Dispo: Anticipated discharge in approximately 2 day(s).   Daniel Gerold, DO 06/24/2020, 7:35 AM Pager: [phone] After 5pm on weekdays and 1pm on weekends: On Call pager [phone]

## 2020-06-25 ENCOUNTER — Inpatient Hospital Stay (HOSPITAL_COMMUNITY): Payer: Medicare Other

## 2020-06-25 ENCOUNTER — Other Ambulatory Visit: Payer: Self-pay

## 2020-06-25 ENCOUNTER — Telehealth: Payer: Self-pay

## 2020-06-25 ENCOUNTER — Ambulatory Visit: Payer: Medicare Other | Admitting: Internal Medicine

## 2020-06-25 DIAGNOSIS — C159 Malignant neoplasm of esophagus, unspecified: Secondary | ICD-10-CM

## 2020-06-25 HISTORY — PX: IR GASTROSTOMY TUBE MOD SED: IMG625

## 2020-06-25 LAB — CBC
HCT: 27.3 % — ABNORMAL LOW (ref 39.0–52.0)
Hemoglobin: 9.2 g/dL — ABNORMAL LOW (ref 13.0–17.0)
MCH: 33.6 pg (ref 26.0–34.0)
MCHC: 33.7 g/dL (ref 30.0–36.0)
MCV: 99.6 fL (ref 80.0–100.0)
Platelets: 239 10*3/uL (ref 150–400)
RBC: 2.74 MIL/uL — ABNORMAL LOW (ref 4.22–5.81)
RDW: 12.9 % (ref 11.5–15.5)
WBC: 12.7 10*3/uL — ABNORMAL HIGH (ref 4.0–10.5)
nRBC: 0 % (ref 0.0–0.2)

## 2020-06-25 LAB — BASIC METABOLIC PANEL
Anion gap: 12 (ref 5–15)
BUN: 29 mg/dL — ABNORMAL HIGH (ref 8–23)
CO2: 24 mmol/L (ref 22–32)
Calcium: 11.4 mg/dL — ABNORMAL HIGH (ref 8.9–10.3)
Chloride: 104 mmol/L (ref 98–111)
Creatinine, Ser: 1.26 mg/dL — ABNORMAL HIGH (ref 0.61–1.24)
GFR calc Af Amer: >60 mL/min (ref >60)
GFR calc non Af Amer: 54 mL/min — ABNORMAL LOW (ref >60)
Glucose, Bld: 101 mg/dL — ABNORMAL HIGH (ref 70–99)
Potassium: 3.8 mmol/L (ref 3.5–5.1)
Sodium: 140 mmol/L (ref 135–145)

## 2020-06-25 LAB — PROTIME-INR
INR: 1.3 — ABNORMAL HIGH (ref 0.8–1.2)
Prothrombin Time: 15.3 seconds — ABNORMAL HIGH (ref 11.4–15.2)

## 2020-06-25 MED ORDER — APIXABAN 2.5 MG PO TABS
2.5000 mg | ORAL_TABLET | Freq: Two times a day (BID) | ORAL | Status: DC
  Administered 2020-06-26 – 2020-06-27 (×3): 2.5 mg via ORAL
  Filled 2020-06-25 (×3): qty 1

## 2020-06-25 MED ORDER — GLUCAGON HCL RDNA (DIAGNOSTIC) 1 MG IJ SOLR
INTRAMUSCULAR | Status: AC | PRN
  Administered 2020-06-25: .5 mg via INTRAVENOUS

## 2020-06-25 MED ORDER — ONDANSETRON HCL 4 MG/2ML IJ SOLN
4.0000 mg | INTRAMUSCULAR | Status: DC | PRN
  Filled 2020-06-25: qty 2

## 2020-06-25 MED ORDER — FENTANYL CITRATE (PF) 100 MCG/2ML IJ SOLN
INTRAMUSCULAR | Status: AC
  Filled 2020-06-25: qty 2

## 2020-06-25 MED ORDER — GLUCAGON HCL RDNA (DIAGNOSTIC) 1 MG IJ SOLR
INTRAMUSCULAR | Status: AC
  Filled 2020-06-25: qty 1

## 2020-06-25 MED ORDER — METOPROLOL TARTRATE 25 MG/10 ML ORAL SUSPENSION
25.0000 mg | Freq: Two times a day (BID) | ORAL | Status: DC
  Administered 2020-06-25 – 2020-06-27 (×4): 25 mg via ORAL
  Filled 2020-06-25 (×5): qty 10

## 2020-06-25 MED ORDER — HYDROMORPHONE HCL 1 MG/ML IJ SOLN
1.0000 mg | INTRAMUSCULAR | Status: DC | PRN
  Administered 2020-06-25: 1 mg via INTRAVENOUS
  Filled 2020-06-25: qty 1

## 2020-06-25 MED ORDER — IOHEXOL 300 MG/ML  SOLN
50.0000 mL | Freq: Once | INTRAMUSCULAR | Status: AC | PRN
  Administered 2020-06-25: 10 mL

## 2020-06-25 MED ORDER — OXYCODONE-ACETAMINOPHEN 5-325 MG PO TABS: 2.0000 | ORAL_TABLET | Freq: Three times a day (TID) | ORAL | Status: DC | PRN

## 2020-06-25 MED ORDER — FENTANYL CITRATE (PF) 100 MCG/2ML IJ SOLN
INTRAMUSCULAR | Status: AC | PRN
  Administered 2020-06-25: 25 ug via INTRAVENOUS

## 2020-06-25 MED ORDER — DABIGATRAN ETEXILATE MESYLATE 75 MG PO CAPS: 75.0000 mg | ORAL_CAPSULE | Freq: Two times a day (BID) | ORAL | Status: DC

## 2020-06-25 MED ORDER — MIDAZOLAM HCL 2 MG/2ML IJ SOLN
INTRAMUSCULAR | Status: AC
  Filled 2020-06-25: qty 2

## 2020-06-25 MED ORDER — CEFAZOLIN SODIUM-DEXTROSE 2-4 GM/100ML-% IV SOLN
INTRAVENOUS | Status: AC
  Administered 2020-06-25: 2 g via INTRAVENOUS
  Filled 2020-06-25: qty 100

## 2020-06-25 MED ORDER — POLYETHYLENE GLYCOL 3350 17 G PO PACK
17.0000 g | PACK | Freq: Every day | ORAL | Status: DC
  Administered 2020-06-25 – 2020-06-27 (×3): 17 g via ORAL
  Filled 2020-06-25 (×3): qty 1

## 2020-06-25 MED ORDER — LIDOCAINE HCL 1 % IJ SOLN
INTRAMUSCULAR | Status: AC
  Filled 2020-06-25: qty 20

## 2020-06-25 MED ORDER — PANTOPRAZOLE SODIUM 40 MG PO PACK
40.0000 mg | PACK | Freq: Every day | ORAL | Status: DC
  Administered 2020-06-26 – 2020-07-09 (×14): 40 mg
  Filled 2020-06-25 (×14): qty 20

## 2020-06-25 MED ORDER — SODIUM CHLORIDE 0.9 % IV SOLN
510.0000 mg | Freq: Once | INTRAVENOUS | Status: AC
  Administered 2020-06-25: 510 mg via INTRAVENOUS
  Filled 2020-06-25: qty 17

## 2020-06-25 MED ORDER — MIDAZOLAM HCL 2 MG/2ML IJ SOLN
INTRAMUSCULAR | Status: AC | PRN
  Administered 2020-06-25: 0.5 mg via INTRAVENOUS

## 2020-06-25 MED ORDER — LIDOCAINE HCL 1 % IJ SOLN
INTRAMUSCULAR | Status: AC | PRN
  Administered 2020-06-25: 10 mL

## 2020-06-25 MED ORDER — HYDROCODONE-ACETAMINOPHEN 7.5-325 MG/15ML PO SOLN
10.0000 mL | Freq: Three times a day (TID) | ORAL | Status: DC | PRN
  Administered 2020-06-25 – 2020-06-26 (×2): 10 mL via ORAL
  Filled 2020-06-25 (×2): qty 15

## 2020-06-25 NOTE — Progress Notes (Signed)
Nutrition Follow-up  DOCUMENTATION CODES:   Underweight  INTERVENTION:  Continue Ensure Enlive po BID, each supplement provides 350 kcal and 20 grams of protein  Continue 30 ml Prostat po BID, each supplement provides 100 kcal and 15 grams of protein.   Once G-tube cleared ready for nutrition use,  Recommend Osmolite 1.5 formula at starting rate of 20 ml/hr and increase by 10 ml every 4 hours to goal rate of 50 ml/hr.  Tube feeding to provide 1800 kcal (100% of kcal needs), 75 grams of protein (88% of protein needs), and 912 ml free water.   NUTRITION DIAGNOSIS:   Inadequate oral intake related to inability to eat as evidenced by NPO status; diet advanced; ongoing  GOAL:   Patient will meet greater than or equal to 90% of their needs; progressing  MONITOR:   Diet advancement, Skin, Weight trends, Labs, I & O's, Supplement acceptance  REASON FOR ASSESSMENT:   Consult Assessment of nutrition requirement/status  ASSESSMENT:   79 year old man with a history of PUD, tobacco use, chronic A. fib, PAD, and HTN admitted for hyponatremia with plans for EGD for odynophagia.  EGD showed acircumferential, near obstructing, ulcerated mass in the midesophagus. PO intake has been poor due to esophageal pain. Per MD, plan for G-tube for supplementation nutrition. Pt in procedure for G-tube placement during time of visit. RD consulted for G-tube nutrition recommendations. Recommendations for tube feeding stated above. Recommend initiation once G-tube stable and cleared for nutrition use. Pt currently has Ensure and Prostat ordered and has been consuming them. RD to continue with current orders to aid in caloric and protein needs.   Labs and medications reviewed.   Diet Order:   Diet Order            DIET SOFT Room service appropriate? Yes; Fluid consistency: Thin  Diet effective now                 EDUCATION NEEDS:   Not appropriate for education at this time  Skin:  Skin  Assessment: Reviewed RN Assessment  Last BM:  Unknown  Height:   Ht Readings from Last 1 Encounters:  06/21/20 5\' 9"  (1.753 m)    Weight:   Wt Readings from Last 1 Encounters:  06/25/20 39.2 kg    Ideal Body Weight:  72.7 kg  BMI:  Body mass index is 12.76 kg/m.  Estimated Nutritional Needs:   Kcal:  0051-1021  Protein:  85-95 grams  Fluid:  >/= 1.7 L/day   Corrin Parker, MS, RD, LDN RD pager number/after hours weekend pager number on Amion.

## 2020-06-25 NOTE — TOC Progression Note (Signed)
Transition of Care [facility]) - Progression Note    Patient Details  Name: Daniel [name] MRN: [id number] Date of Birth: Nov 29, 1941  [facility] CM/SW Contact  Jacalyn Lefevre Edson Snowball, RN Phone Number: 06/25/2020, 3:09 PM  Clinical Narrative:     Spoke to patient, wife and son at bedside. Discussed home health PT, RN and tube feeds. Explained that prior to discharge bedside nurse at hospital will teach patient and family PEG tube care and Tube feeds. Explained difference between bolus, cyclic and continuous. All three voiced understanding.   Son has Medicare.gov list of home health agencies. Will follow up for chose of agency.   Referral to [facility] for tube feeds.  Expected Discharge Plan: [place] Barriers to Discharge: Continued Medical Work up  Expected Discharge Plan and Services Expected Discharge Plan: [place]   Discharge Planning Services: CM Consult Post Acute Care Choice: [place] arrangements for the past 2 months: Apartment                 DME Arranged: Tube feeding, Tube feeding pump         HH Arranged: RN, PT           Social Determinants of Health (SDOH) Interventions    Readmission Risk Interventions No flowsheet data found.

## 2020-06-25 NOTE — Telephone Encounter (Signed)
Yes, he will be going home soon.  Please put him on whatever wait list we have for any last minute EUS openings prior to the 19th (with GM or myself).    Thanks

## 2020-06-25 NOTE — Progress Notes (Addendum)
Subjective:  Patient evaluated at bedside. His wife and son are present. He states that the Percocet BID did not improve his diffuse pain. He also complains of constipation. We discussed plan for feeding tube today and dietician education for G-tube instruction. Patient voices understanding of the plan.   Objective:  Vital signs in last 24 hours: Vitals:   06/24/20 1220 06/24/20 1823 06/25/20 0032 06/25/20 0500  BP: 101/69 (!) 101/49 (!) 119/56 (!) 124/59  Pulse: 60 (!) 58 66 64  Resp: 16 16 17 17   Temp: 97.8 F (36.6 C) 98.1 F (36.7 C) 99.2 F (37.3 C) 99.3 F (37.4 C)  TempSrc: Oral Oral Oral Oral  SpO2: 100% 100% 100% 100%  Weight:    39.2 kg  Height:        Physical Exam  Physical Exam Constitutional:      General: He is not in acute distress.    Comments: frail  HENT:     Head: Normocephalic.  Eyes:     General: No scleral icterus. Cardiovascular:     Rate and Rhythm: Normal rate and regular rhythm.     Heart sounds: Normal heart sounds.  Pulmonary:     Effort: No respiratory distress.  Abdominal:     Palpations: Abdomen is soft.     Tenderness: There is no abdominal tenderness. There is no guarding.     Comments: Abdominal bruit  Musculoskeletal:     Right lower leg: No edema.     Left lower leg: No edema.  Skin:    General: Skin is warm.  Neurological:     Mental Status: He is alert.     Assessment/Plan: Daniel [name] is a 79 year old man with a history of PUD, tobacco use, chronic A. fib, PAD, and HTN admitted for hyponatremia incidentally discovered in anticipation of EGD for odynophagia. EGD showed an mid-esophageal mass.  Principal Problem:   Hyponatremia Active Problems:   Atrial fibrillation status post cardioversion (HCC)   Lung nodule   Moderate protein-calorie malnutrition (HCC)   Odynophagia   Goals of care, counseling/discussion   Esophageal cancer, stage IIB (HCC)  Esophageal mass: - EGD showed a circumferential, near  obstructing, ulcerated mass in the midesophagus 22 cm from the incisors and extends 4-5 cm. Awaiting pathology results. - CT head, abdomen and pelvis shows no metastatic findings - Oncology Dr. Marin Olp consulted and will see patient outpatient. Planned to start treatment when improved nutritional and functional status. - G-tube placement planned today for additional nutrition. - Switch his medications to suspension form concerning his dysphagia.  - Continue PPI for GI prophylaxis - Percocet 10 mg PRN for pain  - GI recommend EUS outpatient procedure. Plan 08/08/20. - PT recommended home health PT.     Hypercalcemia - Inappropriately Normal PTH  - Normal vitamin D level - Elevated ionized calcium level - Pending PTHr peptide level   Malnutrition - Due to dysphagia and low low solute intake - Pre-albumin of 13.8 - Encourage patient to intake more protein. G-tube will be helpful for additional nutrition.   Paroxysmal a-fib  - Heart rate within normal limit - Continue Metoprolol and Dronedarone  - Holding Pradaxa for G-tube placement    Normocytic anemia - Iron studies revealed ferritin 131, TIBC normal at 272, iron saturation 5, iron 13. Vitamin H57, folic acid unremarkable. Combination of iron deficiency anemia and inflammation, secondary to ulcerated esophageal mass. - Added Feraheme 510 mg IV   - Continue CBC daily.   Hyponatremia- resolved -  hypotonic, euvolemic hyponatremia.  Likely chronic and multifactorial with limited solute intake, but mildly elevated urine osmolality of 212 suggests some degree of inappropriate ADH activity. -  Encourage patient to intake more protein. G-tube will be helpful for additional nutrition. - Continue to monitor   Diet: soft diet Fluid: LR DVT: SCD Code: DNR  Prior to Admission Living Arrangement: home Anticipated Discharge Location: home health PT Barriers to Discharge: G-tube placement and teaching Dispo: Anticipated discharge in  approximately 1-2 day(s).   [name] Gerold, DO 06/25/2020, 7:19 AM Pager: [phone] After 5pm on weekdays and 1pm on weekends: On Call pager [phone]

## 2020-06-25 NOTE — Telephone Encounter (Signed)
EUS scheduled for 08/08/20 at 830 am [facility] COVID test on 8/16 at 955 am.  The pt is currently admitted.  Dr Ardis Hughs is EUS still needed?

## 2020-06-25 NOTE — Telephone Encounter (Signed)
-----   Message from Milus Banister, MD sent at 06/25/2020  6:59 AM EDT ----- Regarding: RE: Happy to help however the soonest available appointment that I have is August 19 due to an early August hospital week and vacation time in the interim.  I am already overbooked on my Thursdays other than that.  Chester Holstein may have sooner appointment availability.   Mariadejesus Cade, Please arrange endoscopic ultrasound August 19 for esophageal cancer staging.  If Chester Holstein has sooner appointment availability please offer that instead.   Thanks  ----- Message ----- From: Thornton Park, MD Sent: 06/23/2020  12:16 PM EDT To: Milus Banister, MD, Gatha Mayer, MD, # Subject: RE:                                            Thanks for your input. Biopsy results will be back Tuesday.  Joelene Millin ----- Message ----- From: Willia Craze, NP Sent: 06/23/2020  12:12 PM EDT To: Milus Banister, MD, Gatha Mayer, MD, #  Hi Dan,  Patient found to have esophageal mass, no mets on staging CT scan. Will you please review case for an outpatient EUS?  Thanks,  PG

## 2020-06-25 NOTE — Evaluation (Signed)
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 He has been having progressive odynophagia and dysphagia to solids and referred to GI for EGD with possible dilation. EGD showed esophageal ca and will be getting feeding tube placed today.  Clinical Impression  Patient received in bed. Wife at bedside. He voices that he wants to go home. Agreeable to PT assessment. Slightly agitated intially. Reports his mother and another family member live in the same facility as him but they do not live together. Apparently his wife lives at home. Patient performed bed mobility with mod independence. Transfers with min guard. He was initially slightly unsteady with ambulation and had some staggering therefore gave him my hand. With improved distance, he improved balance and no longer required hand held assist. Ambulated approx 200 feet. He will continue to benefit from skilled PT while here to improve balance and strength for return home independently.          Follow Up Recommendations Home health PT- he may refuse.     Equipment Recommendations  None recommended by PT    Recommendations for Other Services       Precautions / Restrictions Precautions Precautions: Fall Restrictions Weight Bearing Restrictions: No      Mobility  Bed Mobility Overal bed mobility: Modified Independent             General bed mobility comments: increased time, HOB elevated  Transfers Overall transfer level: Needs assistance Equipment used: None Transfers: Sit to/from Stand Sit to Stand: Min guard         General transfer  comment: patient slightly unsteady with initial standing mobility  Ambulation/Gait Ambulation/Gait assistance: Min guard Gait Distance (Feet): 200 Feet Assistive device: None;1 person hand held assist Gait Pattern/deviations: Step-through pattern;Staggering left;Staggering right;Decreased stride length Gait velocity: decreased   General Gait Details: initially patient required hand held assist due to staggering, after increased distance, patient was steadier and did not require hand held assist.  Stairs            Wheelchair Mobility    Modified Rankin (Stroke Patients Only)       Balance Overall balance assessment: Needs assistance Sitting-balance support: Feet supported Sitting balance-Leahy Scale: Good     Standing balance support: Single extremity supported;During functional activity Standing balance-Leahy Scale: Fair Standing balance comment: at increased fall risk                             Pertinent Vitals/Pain Pain Assessment: Faces Faces Pain Scale: Hurts a little bit Pain Location: throat Pain Descriptors / Indicators: Sore    Home Living Family/patient expects to be discharged to:: Private residence Living Arrangements: Alone Available Help at Discharge: Family;Available PRN/intermittently Type of Home: Independent living facility Home Access: Elevator     Home Layout: One level Home Equipment: None      Prior Function Level of Independence: Independent         Comments: patient reports he is independent, drives     Hand Dominance        Extremity/Trunk Assessment   Upper Extremity Assessment Upper Extremity  Assessment: Generalized weakness    Lower Extremity Assessment Lower Extremity Assessment: Generalized weakness    Cervical / Trunk Assessment Cervical / Trunk Assessment: Normal  Communication   Communication: No difficulties  Cognition Arousal/Alertness: Awake/alert Behavior During Therapy: WFL for tasks  assessed/performed Overall Cognitive Status: Within Functional Limits for tasks assessed                                        General Comments      Exercises     Assessment/Plan    PT Assessment Patient needs continued PT services  PT Problem List Decreased strength;Decreased mobility;Decreased balance       PT Treatment Interventions Therapeutic activities;Gait training;Therapeutic exercise;Patient/family education;Functional mobility training;Balance training    PT Goals (Current goals can be found in the Care Plan section)  Acute Rehab PT Goals Patient Stated Goal: to leave and go back home PT Goal Formulation: With patient Time For Goal Achievement: 07/02/20 Potential to Achieve Goals: Good    Frequency Min 3X/week   Barriers to discharge Decreased caregiver support lives in independent living facility alone.    Co-evaluation               AM-PAC PT "6 Clicks" Mobility  Outcome Measure Help needed turning from your back to your side while in a flat bed without using bedrails?: None Help needed moving from lying on your back to sitting on the side of a flat bed without using bedrails?: None Help needed moving to and from a bed to a chair (including a wheelchair)?: A Little Help needed standing up from a chair using your arms (e.g., wheelchair or bedside chair)?: A Little Help needed to walk in hospital room?: A Little Help needed climbing 3-5 steps with a railing? : A Little 6 Click Score: 20    End of Session Equipment Utilized During Treatment: Gait belt Activity Tolerance: Patient tolerated treatment well Patient left: in bed;with call bell/phone within reach;with bed alarm set;with family/visitor present Nurse Communication: Mobility status PT Visit Diagnosis: Muscle weakness (generalized) (M62.81);Unsteadiness on feet (R26.81)    Time: 1105-1130 PT Time Calculation (min) (ACUTE ONLY): 25 min   Charges:   PT Evaluation $PT Eval  Moderate Complexity: 1 Mod PT Treatments $Gait Training: 8-22 mins        Shirlean Berman, PT, GCS 06/25/20,11:43 AM

## 2020-06-25 NOTE — Procedures (Signed)
  Procedure: Gastrostomy catheter placement 103f EBL:   minimal Complications:  none immediate  See full dictation in [employer].  Dillard Cannon MD Main # [phone] Pager  [phone]

## 2020-06-26 LAB — CBC
HCT: 27 % — ABNORMAL LOW (ref 39.0–52.0)
Hemoglobin: 8.9 g/dL — ABNORMAL LOW (ref 13.0–17.0)
MCH: 32.6 pg (ref 26.0–34.0)
MCHC: 33 g/dL (ref 30.0–36.0)
MCV: 98.9 fL (ref 80.0–100.0)
Platelets: 210 10*3/uL (ref 150–400)
RBC: 2.73 MIL/uL — ABNORMAL LOW (ref 4.22–5.81)
RDW: 12.9 % (ref 11.5–15.5)
WBC: 12.6 10*3/uL — ABNORMAL HIGH (ref 4.0–10.5)
nRBC: 0 % (ref 0.0–0.2)

## 2020-06-26 LAB — BASIC METABOLIC PANEL
Anion gap: 9 (ref 5–15)
BUN: 29 mg/dL — ABNORMAL HIGH (ref 8–23)
CO2: 26 mmol/L (ref 22–32)
Calcium: 11.4 mg/dL — ABNORMAL HIGH (ref 8.9–10.3)
Chloride: 105 mmol/L (ref 98–111)
Creatinine, Ser: 1.31 mg/dL — ABNORMAL HIGH (ref 0.61–1.24)
GFR calc Af Amer: >60 mL/min (ref >60)
GFR calc non Af Amer: 52 mL/min — ABNORMAL LOW (ref >60)
Glucose, Bld: 109 mg/dL — ABNORMAL HIGH (ref 70–99)
Potassium: 3.8 mmol/L (ref 3.5–5.1)
Sodium: 140 mmol/L (ref 135–145)

## 2020-06-26 LAB — SURGICAL PATHOLOGY

## 2020-06-26 LAB — GLUCOSE, CAPILLARY
Glucose-Capillary: 64 mg/dL — ABNORMAL LOW (ref 70–99)
Glucose-Capillary: 159 mg/dL — ABNORMAL HIGH (ref 70–99)

## 2020-06-26 MED ORDER — DEXTROSE 50 % IV SOLN
1.0000 | Freq: Once | INTRAVENOUS | Status: AC
  Administered 2020-06-26: 25 mL via INTRAVENOUS

## 2020-06-26 MED ORDER — LACTATED RINGERS IV BOLUS
1000.0000 mL | Freq: Once | INTRAVENOUS | Status: AC
  Administered 2020-06-26: 1000 mL via INTRAVENOUS

## 2020-06-26 MED ORDER — FREE WATER
200.0000 mL | Freq: Four times a day (QID) | Status: DC
  Administered 2020-06-26 – 2020-06-27 (×4): 200 mL

## 2020-06-26 MED ORDER — OSMOLITE 1.5 CAL PO LIQD
1000.0000 mL | ORAL | Status: AC
  Administered 2020-06-26 – 2020-06-27 (×3): 1000 mL
  Filled 2020-06-26 (×4): qty 1000

## 2020-06-26 MED ORDER — HYDROMORPHONE HCL 1 MG/ML IJ SOLN
1.0000 mg | INTRAMUSCULAR | Status: DC | PRN
  Administered 2020-06-26 – 2020-06-27 (×3): 1 mg via INTRAVENOUS
  Filled 2020-06-26 (×4): qty 1

## 2020-06-26 MED ORDER — HYDROCODONE-ACETAMINOPHEN 7.5-325 MG/15ML PO SOLN
15.0000 mL | Freq: Three times a day (TID) | ORAL | Status: DC | PRN
  Administered 2020-06-27 (×2): 15 mL via ORAL
  Filled 2020-06-26 (×3): qty 15

## 2020-06-26 MED ORDER — SODIUM CHLORIDE 0.9 % IV SOLN: 40.0000 mg | Freq: Once | INTRAVENOUS | Status: DC

## 2020-06-26 MED ORDER — OSMOLITE 1.2 CAL PO LIQD
1000.0000 mL | ORAL | Status: DC
Start: 2020-06-26 — End: 2020-06-26

## 2020-06-26 MED ORDER — DEXTROSE 50 % IV SOLN
INTRAVENOUS | Status: AC
  Filled 2020-06-26: qty 50

## 2020-06-26 MED ORDER — ZOLEDRONIC ACID 4 MG/5ML IV CONC
4.0000 mg | Freq: Once | INTRAVENOUS | Status: AC
  Administered 2020-06-26: 4 mg via INTRAVENOUS
  Filled 2020-06-26: qty 5

## 2020-06-26 MED ORDER — RAMELTEON 8 MG PO TABS
8.0000 mg | ORAL_TABLET | Freq: Every day | ORAL | Status: DC
  Administered 2020-06-26: 8 mg via ORAL
  Filled 2020-06-26 (×2): qty 1

## 2020-06-26 MED ORDER — SODIUM CHLORIDE 0.9 % IV SOLN: 510.0000 mg | Freq: Once | INTRAVENOUS | Status: DC

## 2020-06-26 MED ORDER — METHYLPREDNISOLONE SODIUM SUCC 125 MG IJ SOLR: 80.0000 mg | Freq: Once | INTRAMUSCULAR | Status: DC

## 2020-06-26 NOTE — TOC Progression Note (Addendum)
Transition of Care [facility]) - Progression Note    Patient Details  Name: Artemio Dobie MRN: [id number] Date of Birth: Mar 03, 1941  [facility] CM/SW Contact  Jacalyn Lefevre Edson Snowball, RN Phone Number: 06/26/2020, 11:37 AM  Clinical Narrative:      Followed up with patient regarding home health agency selection. Patient asked NCM to check with him tomorrow morning.  47 Family  At bedside, followed up regarding home health agency. Son had to leave. Will follow up in morning  Expected Discharge Plan: [place] Barriers to Discharge: Continued Medical Work up  Expected Discharge Plan and Services Expected Discharge Plan: [place]   Discharge Planning Services: CM Consult Post Acute Care Choice: [place] arrangements for the past 2 months: Apartment                 DME Arranged: Tube feeding, Tube feeding pump         HH Arranged: RN, PT           Social Determinants of Health (SDOH) Interventions    Readmission Risk Interventions No flowsheet data found.

## 2020-06-26 NOTE — Progress Notes (Signed)
Nutrition Follow-up  DOCUMENTATION CODES:   Underweight  INTERVENTION:  Continue Ensure Enlive po BID, each supplement provides 350 kcal and 20 grams of protein  Continue 30 ml Prostat po BID, each supplement provides 100 kcal and 15 grams of protein.   Initiate Osmolite 1.5 formula at starting rate of 20 ml/hr and increase by 10 ml every 6 hours to goal rate of 50 ml/hr.  Tube feeding to provide 1800 kcal (100% of kcal needs), 75 grams of protein (88% of protein needs), and 912 ml free water.   Monitor magnesium, potassium, and phosphorus daily for at least 3 days, MD to replete as needed, as pt is at risk for refeeding syndrome given poor po and significant weight loss.  NUTRITION DIAGNOSIS:   Inadequate oral intake related to inability to eat as evidenced by NPO status; diet advanced; progressing  GOAL:   Patient will meet greater than or equal to 90% of their needs; met with TF  MONITOR:   Diet advancement, Skin, Weight trends, Labs, I & O's, Supplement acceptance  REASON FOR ASSESSMENT:   Consult Assessment of nutrition requirement/status  ASSESSMENT:   79 year old man with a history of PUD, tobacco use, chronic A. fib, PAD, and HTN admitted for hyponatremia with plans for EGD for odynophagia. EGD showed acircumferential, near obstructing, ulcerated mass in the midesophagus. G-tube placed 7/6.   RD working remotely.  IR has cleared G-tube ready for nutrition use at 1400 today. MD has given consent for RD to order tube feeding. Spoke with pt over phone. Pt agreeable to tube feeding initiation today. RD to order. Noted pt at risk for refeeding syndrome given poor po and significant weight loss. Pt reports not being able to consume food at meals, however able to consuming liquids/fluids and Ensure. Pt reports consuming at least 4-5 Ensure shakes at home over the past month prior to admission.   Labs and medications reviewed.   Diet Order:   Diet Order             DIET SOFT Room service appropriate? Yes; Fluid consistency: Thin  Diet effective now                 EDUCATION NEEDS:   Not appropriate for education at this time  Skin:  Skin Assessment: Reviewed RN Assessment  Last BM:  unknown  Height:   Ht Readings from Last 1 Encounters:  06/21/20 5' 9"  (1.753 m)    Weight:   Wt Readings from Last 1 Encounters:  06/26/20 36.9 kg    Ideal Body Weight:  72.7 kg  BMI:  Body mass index is 12.01 kg/m.  Estimated Nutritional Needs:   Kcal:  5284-1324  Protein:  85-95 grams  Fluid:  >/= 1.7 L/day    Corrin Parker, MS, RD, LDN RD pager number/after hours weekend pager number on Amion.

## 2020-06-26 NOTE — Progress Notes (Signed)
[name] had the G-tube placed yesterday.  Everything went fairly well.  His prealbumin is 13.8.  This actually is better than I would have thought.  As such, there might be a little bit of hope of trying to help him out with treatment.  His iron levels are quite low.  His saturation is only 5%.  We will need some IV iron.  Surprisingly, the pathology is not back yet.  I am not sure what the hold-up is also the fact that it was a holiday weekend.  Again, he still is no candidate for intervention right now.  He just would not be able to tolerate any intervention.  He clearly is not a surgical candidate.  I do think that the esophageal EUS would be helpful for staging as this would help dictate how he might be treated.  Again we really have to get his nutritional state up.  I think tube feeds will clearly do this for [place].  His calcium is a little on the high side.  This will have to be watched closely.  I probably would give him a dose of Zometa.  He definitely needs some IV hydration.  He might be able to go home soon.  He will had to have a teaching regarding the tube feedings.  Daniel Haw, MD  Psalms 16:6-7

## 2020-06-26 NOTE — Plan of Care (Signed)
  Problem: Clinical Measurements: Goal: Will remain free from infection Outcome: Progressing   Problem: Nutrition: Goal: Adequate nutrition will be maintained Outcome: Not Progressing   Problem: Pain Managment: Goal: General experience of comfort will improve Outcome: Progressing

## 2020-06-26 NOTE — Progress Notes (Signed)
Referring Physician(s): Dr. Tarri Glenn  Supervising Physician: Aletta Edouard  Patient Status:  [facility] - In-pt  Chief Complaint: Severe malnutrition  Subjective: Patient resting this AM.  More groggy today than after EGD last week.  States his belly is "sore."  He is tender at the tube insertion site.   Allergies: Amiodarone and Flomax [tamsulosin hcl]  Medications: Prior to Admission medications   Medication Sig Start Date End Date Taking? Authorizing Provider  Acetaminophen (TYLENOL PO) Take 1-2 tablets by mouth daily as needed (Pain).   Yes [provider]  atorvastatin (LIPITOR) 20 MG tablet Take 1 tablet (20 mg total) by mouth daily. 02/12/20  Yes [name], MD  dabigatran (PRADAXA) 75 MG CAPS capsule Take 1 capsule (75 mg total) by mouth 2 (two) times daily. 09/29/19  Yes Aldine Contes, MD  enalapril (VASOTEC) 10 MG tablet Take 2 tablets (20 mg total) by mouth 2 (two) times daily. 01/10/20  Yes [name], MD  metoprolol succinate (TOPROL-XL) 50 MG 24 hr tablet Take 1 tablet (50 mg total) by mouth daily. 02/12/20  Yes [name], MD  Tuscola 400 MG tablet TAKE 1 TABLET BY MOUTH TWICE DAILY WITH MEALS Patient taking differently: Take 400 mg by mouth 2 (two) times daily with a meal.  02/12/20  Yes [name], MD  vitamin B-12 (CYANOCOBALAMIN) 1000 MCG tablet Take 1 tablet (1,000 mcg total) by mouth daily. 12/12/15  Yes [name], MD     Vital Signs: BP 118/68   Pulse 95   Temp 97.9 F (36.6 C) (Oral)   Resp 20   Ht 5\' 9"  (1.753 m)   Wt 81 lb 5.6 oz (36.9 kg)   SpO2 95%   BMI 12.01 kg/m   Physical Exam  NAD, alert Abdomen: soft.  Insertion site with old dried blood, no new bleeding or oozing.  Small hematoma on the lateral aspect of the insertion site.  Stable. Tender to palpation.   Imaging: CT HEAD W & WO CONTRAST  Result Date: 06/22/2020 CLINICAL DATA:  79 year old male with recently diagnosed  ulcerated esophageal tumor. Staging. EXAM: CT HEAD WITHOUT AND WITH CONTRAST TECHNIQUE: Contiguous axial images were obtained from the base of the skull through the vertex without and with intravenous contrast CONTRAST:  127mL OMNIPAQUE IOHEXOL 300 MG/ML  SOLN COMPARISON:  Brain MRI 01/11/2014. FINDINGS: Brain: Cerebral volume appears stable since 2015. Mild for age patchy bilateral cerebral white matter hypodensity. Otherwise normal gray-white matter differentiation. No midline shift, ventriculomegaly, mass effect, evidence of mass lesion, intracranial hemorrhage or evidence of cortically based acute infarction. No abnormal enhancement identified. Vascular: Calcified atherosclerosis at the skull base. The major intracranial vascular structures are enhancing and appear to be patent. Skull: Negative. Sinuses/Orbits: Visualized paranasal sinuses and mastoids are clear. Other: No acute orbit or scalp soft tissue findings. IMPRESSION: No metastatic disease or acute intracranial abnormality. Largely unremarkable for age CT appearance of the brain Electronically Signed   By: Genevie Ann M.D.   On: 06/22/2020 11:50   CT ABDOMEN PELVIS W CONTRAST  Result Date: 06/22/2020 CLINICAL DATA:  Initial workup of newly diagnosed esophageal cancer. Esophageal mass concerning for malignancy, with circumferential near obstructing ulcerative mass in the mid esophagus. EXAM: CT ABDOMEN AND PELVIS WITH CONTRAST TECHNIQUE: Multidetector CT imaging of the abdomen and pelvis was performed using the standard protocol following bolus administration of intravenous contrast. CONTRAST:  141mL OMNIPAQUE IOHEXOL 300 MG/ML  SOLN COMPARISON:  06/20/2020, 11/05/2010 FINDINGS: Lower chest:  Stable 1.0 centimeter soft tissue mass in the LEFT lung base, consistent with hamartoma. Paraseptal emphysematous changes at the lung bases. Heart size is normal. Hepatobiliary: Small cyst identified within the LEFT hepatic lobe are stable. No suspicious liver  lesions. Gallbladder is present. Pancreas: Unremarkable. No pancreatic ductal dilatation or surrounding inflammatory changes. Spleen: Stable comments densely calcified oval mass along the MEDIAL aspect of the spleen is 3.4 x 2.9 centimeters. Otherwise the spleen is unremarkable. Adrenals/Urinary Tract: Normal adrenal glands. Small low-attenuation lesions within the kidneys are consistent with small cysts. There is no hydronephrosis. Ureters are unremarkable. The bladder and visualized portion of the urethra are normal. Stomach/Bowel: Stomach is normal in appearance. Small bowel loops are normal in caliber and wall thickness. Colon is unremarkable. Vascular/Lymphatic: There is dense atherosclerotic calcification of the abdominal aorta and its branches. No associated aneurysm. There is normal vascular opacification of the celiac axis, superior mesenteric artery, and inferior mesenteric artery. Normal appearance of the portal venous system and inferior vena cava. No retroperitoneal or mesenteric adenopathy. Reproductive: Prostate gland is prominent. Other: Anterior abdominal wall is unremarkable.  No ascites. Musculoskeletal: Degenerative changes are seen in the LOWER lumbar spine, with facet hypertrophy and uncovertebral spurring at L3-4. IMPRESSION: 1. No evidence for acute abnormality of the abdomen or pelvis. 2. Small hepatic cysts.  No evidence for metastatic disease. 3. Stable appearance of LEFT lung base nodule. 4. Stable appearance of hepatic and renal cysts. 5. Chronic densely calcified splenic mass, consistent with benign process. 6. Prominent prostate gland. 7. Aortic Atherosclerosis (ICD10-I70.0). Electronically Signed   By: Nolon Nations M.D.   On: 06/22/2020 12:03   IR GASTROSTOMY TUBE MOD SED  Result Date: 06/25/2020 CLINICAL DATA:  Esophageal carcinoma, protein calorie malnutrition, needs enteral feeding support EXAM: PERC PLACEMENT GASTROSTOMY FLUOROSCOPY TIME:  8 minutes 6 seconds; 6 mGy  TECHNIQUE: The procedure, risks, benefits, and alternatives were explained to the patient. Questions regarding the procedure were encouraged and answered. The patient understands and consents to the procedure. As antibiotic prophylaxis, cefazolin 2 g was ordered pre-procedure and administered intravenously within one hour of incision.Progression of previously administered oral barium into the colon was confirmed fluoroscopically. A 5 French angiographic catheter was placed as orogastric tube. The upper abdomen was prepped with Betadine, draped in usual sterile fashion, and infiltrated locally with 1% lidocaine. Intravenous Fentanyl 28mcg and Versed 0.5mg  were administered as conscious sedation during continuous monitoring of the patient's level of consciousness and physiological / cardiorespiratory status by the radiology RN, with a total moderate sedation time of 13 minutes. 0.5 mg glucagon given IV to facilitate gastric distention.Stomach was insufflated using air through the orogastric tube. An 71 French sheath needle was advanced percutaneously into the gastric lumen under fluoroscopy. Gas could be aspirated and a small contrast injection confirmed intraluminal spread. The sheath was exchanged over a guidewire for a 9 [place] vascular sheath, through which the snare device was advanced and used to snare a guidewire passed through the orogastric tube. This was withdrawn, and the snare attached to the 20 French pull-through gastrostomy tube, which was advanced antegrade, positioned with the internal bumper securing the anterior gastric wall to the anterior abdominal wall. Small contrast injection confirms appropriate positioning. The external bumper was applied and the catheter was flushed. COMPLICATIONS: COMPLICATIONS none IMPRESSION: 1. Technically successful 20 French pull-through gastrostomy placement under fluoroscopy. 0.5 Electronically Signed   By: Lucrezia Europe M.D.   On: 06/25/2020 15:05     Labs:  CBC: Recent Labs  06/23/20 0234 06/24/20 5400 06/25/20 0612 06/26/20 0453  WBC 9.0 6.9 12.7* 12.6*  HGB 8.4* 8.0* 9.2* 8.9*  HCT 24.8* 24.7* 27.3* 27.0*  PLT 209 205 239 210    COAGS: Recent Labs    06/25/20 0612  INR 1.3*    BMP: Recent Labs    06/23/20 0234 06/24/20 0614 06/25/20 0612 06/26/20 0453  NA 137 137 140 140  K 4.7 3.9 3.8 3.8  CL 105 103 104 105  CO2 26 26 24 26   GLUCOSE 131* 117* 101* 109*  BUN 39* 36* 29* 29*  CALCIUM 10.9* 11.1* 11.4* 11.4*  CREATININE 1.20 1.22 1.26* 1.31*  GFRNONAA 58* 56* 54* 52*  GFRAA >60 >60 >60 >60    LIVER FUNCTION TESTS: Recent Labs    04/15/20 1120 06/19/20 1241  BILITOT 0.2 0.4  AST 17 16  ALT 6 11  ALKPHOS 86 77  PROT 6.4 6.8  ALBUMIN 4.0 4.0    Assessment and Plan: Severe malnutrition Esophageal mass Patient s/p gastrostomy tube placement 7/6.  He is assessed at bedside today after his procedure yesterday.  He is tender at the insertion site.  There is a small hematoma on the lateral aspect on the site which appears stable. No oozing or bleeding.  Tube may be used at 1400 today.  Recommend working with RD to determine risk of refeeding syndrome.  Consider obtained Mg and phos levels due to degree of severe malnutrition.  IR available as needed.   Electronically Signed: Docia Barrier, PA 06/26/2020, 11:00 AM   I spent a total of 15 Minutes at the the patient's bedside AND on the patient's hospital floor or unit, greater than 50% of which was counseling/coordinating care for severe malnutrition.

## 2020-06-26 NOTE — Progress Notes (Addendum)
Subjective:  Patient is examined at bedside. He complains of abdominal soreness after the G-tube placement procedure. Patient states that pain medications only provide minimal relief. Patient also states that he could not sleep at night. Patient states that he would like to go home.  We discussed the importance of G-tube and nutrition education before discharge. He is agreeable with the plan.   Objective:  Vital signs in last 24 hours: Vitals:   06/25/20 2333 06/26/20 0448 06/26/20 0600 06/26/20 0846  BP: (!) 125/52 (!) 107/56  118/68  Pulse: 61 95    Resp: 18 20    Temp: 98.3 F (36.8 C) 97.9 F (36.6 C)    TempSrc: Oral Oral    SpO2: 100% 95%    Weight:   36.9 kg   Height:        Physical Exam  Physical Exam Constitutional:      General: He is not in acute distress.    Comments: frail  HENT:     Head: Atraumatic.  Eyes:     General: No scleral icterus. Pulmonary:     Effort: No respiratory distress.  Abdominal:     General: Abdomen is flat. There is no distension.     Palpations: Abdomen is soft.     Tenderness: There is no abdominal tenderness. There is no guarding.     Comments: G-tube present  Musculoskeletal:     Cervical back: Normal range of motion.     Right lower leg: No edema.     Left lower leg: No edema.  Skin:    General: Skin is warm.  Neurological:     Mental Status: He is alert.     Assessment/Plan: Daniel [name] is a 79 year old man with a history of PUD, tobacco use, chronic A. fib, PAD, and HTN admitted for hyponatremia incidentally discovered in anticipation of EGD for odynophagia. EGD showed an mid-esophageal mass.  Principal Problem:   Hyponatremia Active Problems:   Atrial fibrillation status post cardioversion (HCC)   Lung nodule   Moderate protein-calorie malnutrition (HCC)   Odynophagia   Goals of care, counseling/discussion   Esophageal cancer, stage IIB (HCC)  Esophageal mass: - EGD showed a circumferential, near  obstructing, ulcerated mass in the midesophagus 22 cm from the incisors and extends 4-5 cm. Awaiting pathology results. CT head, abdomen and pelvis shows no metastatic findings - Oncology Dr. Marin Olp thinks that patient is not a candidate for intervention at the moment given his nutritional status. Planned to start treatment when improved nutritional and functional status. - G-tube placed and nutrition education in place. His wife who was a nurse will help him at home.  - Switch his medications to suspension form concerning his dysphagia. Pradaxa was switched to Eliquis per pharmacy  - Continue PPI for GI prophylaxis - Hydrocodone and Dilaudid PRN for pain. Zolpidem for sleep aid. Miralax for bowel regimen. Goal is keep patient comfortable. - GI recommend EUS outpatient procedure. Plan 08/08/20. - PT recommended home health PT.     Hypercalcemia - Inappropriately Normal PTH. Normal vitamin D level. Elevated ionized calcium level - Pending PTHr peptide level - Added Zometa 4 mg once. Recheck Ca level tomorrow   Malnutrition - Due to dysphagia and low solute intake - Pre-albumin of 13.8 - Encourage patient to intake more protein. G-tube for additional nutrition. - Continue hydration with LR 100 cc/h   Paroxysmal a-fib  - Heart rate within normal limit - Continue Metoprolol and Dronedarone  - Eliquis for  anticoagulation     Normocytic anemia - Iron studies revealed ferritin 131, TIBC normal at 272, iron saturation 5, iron 13. Vitamin M62, folic acid unremarkable. Combination of iron deficiency anemia and inflammation, secondary to ulcerated esophageal mass. - Feraheme 510 mg IV given - Continue CBC daily.    Hyponatremia- resolved - hypotonic, euvolemic hyponatremia.  Likely chronic and multifactorial with limited solute intake, but mildly elevated urine osmolality of 212 suggests some degree of inappropriate ADH activity. -  Encourage patient to intake more protein with Ensure and Pro  Stat. G-tube will be helpful for additional nutrition. - Continue to monitor   Diet: soft diet Fluid: LR DVT: Eliquis Code: DNR   Prior to Admission Living Arrangement: home Anticipated Discharge Location: home health PT Barriers to Discharge: G-tube and nutrition teaching Dispo: Anticipated discharge in approximately 3-4 day(s).   [name] Gerold, DO 06/26/2020, 10:39 AM Pager: [phone] After 5pm on weekdays and 1pm on weekends: On Call pager [phone]

## 2020-06-26 NOTE — Hospital Course (Addendum)
Esophageal mass Daniel [name] is a 79 year old man with a history of PUD, tobacco use, chronic A. fib, PAD, and HTN admitted for hyponatremia incidentally discovered in anticipation of EGD for odynophagia. EGD showed a near obstructing, ulcerated mass in the midesophagus 22 cm from the incisors and extends 4-5 cm. Pathology result shows poorly differentiated SCC. The oncologist Dr. Arelia Sneddon will consider treatment once his nutritional status improves. GI recommended EUS outpatient, plan 08/08/2020. G- tube was placed for additional nutrition. Patient is discharged with home health and outpatient palliative care.   Malnutrition Malnutrition status secondary to dysphagia and low solute intake. Pre-albumin of 13.8 on admission. G-tube was placed and started feeding with dietician instruction.One day after starting tube feed, patient has an episode of tachycardia, tremor and hypophosphatemia which could be related to refeeding syndrome. Constipation was also a major cause. Tube feed was held for 2 days for bowel rest. Thiamine was added to help reduce risk of refeeding syndrome. Nutrition was restarted slowly.   Klebsiella bacteremia  Blood culture on 7/721 grew Klebsiella. This is likely from bacterial translocation secondary to stercoral colitis. Patient is afebrile. WBC 7.4. BP 119/65. Patient does not have clinical symptoms of gram negative bacteremia. Ceftriaxone was started for duration of 3 days and switched to Cefazolin for a course of 5 days.    Hyponatremia Patient present with hypotonic, euvolemic hyponatremia on admission. It is likely chronic and multifactorial with limited solute intake, but mildly elevated urine osmolality of 212 suggests some degree of inappropriate ADH activity. Hyponatremia resolved after fluid restriction and protein intake.   Anemia Iron study suggests iron deficiency anemia, likely secondary to the ulcerated esophageal mass. Feraheme was given. Hgb was  stable.  Hypercalcemia Elevated ionized Calcium with inappropriately normal PTH and vitamin D level. Zometa was given  Paroxysmal a-fib Heart rate was controlled during this admission. Metoprolol and Dronedarone were continued. Anticoagulate with Pradaxa, which was later switched to Eliquis because of G-tube.

## 2020-06-27 ENCOUNTER — Inpatient Hospital Stay (HOSPITAL_COMMUNITY): Payer: Medicare Other

## 2020-06-27 LAB — BLOOD GAS, ARTERIAL
Acid-base deficit: 1.8 mmol/L (ref 0.0–2.0)
Bicarbonate: 22 mmol/L (ref 20.0–28.0)
Drawn by: 213381
FIO2: 32
O2 Saturation: 97 %
Patient temperature: 37.4
pCO2 arterial: 35.1 mmHg (ref 32.0–48.0)
pH, Arterial: 7.416 (ref 7.350–7.450)
pO2, Arterial: 87.4 mmHg (ref 83.0–108.0)

## 2020-06-27 LAB — CBC
HCT: 24.7 % — ABNORMAL LOW (ref 39.0–52.0)
HCT: 30.1 % — ABNORMAL LOW (ref 39.0–52.0)
Hemoglobin: 8.2 g/dL — ABNORMAL LOW (ref 13.0–17.0)
Hemoglobin: 9.6 g/dL — ABNORMAL LOW (ref 13.0–17.0)
MCH: 33.3 pg (ref 26.0–34.0)
MCH: 32.8 pg (ref 26.0–34.0)
MCHC: 33.2 g/dL (ref 30.0–36.0)
MCHC: 31.9 g/dL (ref 30.0–36.0)
MCV: 100.4 fL — ABNORMAL HIGH (ref 80.0–100.0)
MCV: 102.7 fL — ABNORMAL HIGH (ref 80.0–100.0)
Platelets: 216 10*3/uL (ref 150–400)
Platelets: 230 10*3/uL (ref 150–400)
RBC: 2.46 MIL/uL — ABNORMAL LOW (ref 4.22–5.81)
RBC: 2.93 MIL/uL — ABNORMAL LOW (ref 4.22–5.81)
RDW: 12.7 % (ref 11.5–15.5)
RDW: 12.8 % (ref 11.5–15.5)
WBC: 9.3 10*3/uL (ref 4.0–10.5)
WBC: 7.1 10*3/uL (ref 4.0–10.5)
nRBC: 0 % (ref 0.0–0.2)
nRBC: 0 % (ref 0.0–0.2)

## 2020-06-27 LAB — BASIC METABOLIC PANEL
Anion gap: 10 (ref 5–15)
BUN: 21 mg/dL (ref 8–23)
CO2: 21 mmol/L — ABNORMAL LOW (ref 22–32)
Calcium: 9.8 mg/dL (ref 8.9–10.3)
Chloride: 109 mmol/L (ref 98–111)
Creatinine, Ser: 1.16 mg/dL (ref 0.61–1.24)
GFR calc Af Amer: >60 mL/min (ref >60)
GFR calc non Af Amer: 60 mL/min — ABNORMAL LOW (ref >60)
Glucose, Bld: 83 mg/dL (ref 70–99)
Potassium: 4.2 mmol/L (ref 3.5–5.1)
Sodium: 140 mmol/L (ref 135–145)

## 2020-06-27 LAB — COMPREHENSIVE METABOLIC PANEL
ALT: 19 U/L (ref 0–44)
AST: 26 U/L (ref 15–41)
Albumin: 2.3 g/dL — ABNORMAL LOW (ref 3.5–5.0)
Alkaline Phosphatase: 47 U/L (ref 38–126)
Anion gap: 7 (ref 5–15)
BUN: 25 mg/dL — ABNORMAL HIGH (ref 8–23)
CO2: 26 mmol/L (ref 22–32)
Calcium: 10.7 mg/dL — ABNORMAL HIGH (ref 8.9–10.3)
Chloride: 107 mmol/L (ref 98–111)
Creatinine, Ser: 1.21 mg/dL (ref 0.61–1.24)
GFR calc Af Amer: >60 mL/min (ref >60)
GFR calc non Af Amer: 57 mL/min — ABNORMAL LOW (ref >60)
Glucose, Bld: 137 mg/dL — ABNORMAL HIGH (ref 70–99)
Potassium: 3.7 mmol/L (ref 3.5–5.1)
Sodium: 140 mmol/L (ref 135–145)
Total Bilirubin: 0.8 mg/dL (ref 0.3–1.2)
Total Protein: 5.1 g/dL — ABNORMAL LOW (ref 6.5–8.1)

## 2020-06-27 LAB — GLUCOSE, CAPILLARY
Glucose-Capillary: 109 mg/dL — ABNORMAL HIGH (ref 70–99)
Glucose-Capillary: 110 mg/dL — ABNORMAL HIGH (ref 70–99)
Glucose-Capillary: 150 mg/dL — ABNORMAL HIGH (ref 70–99)
Glucose-Capillary: 74 mg/dL (ref 70–99)
Glucose-Capillary: 78 mg/dL (ref 70–99)
Glucose-Capillary: 97 mg/dL (ref 70–99)
Glucose-Capillary: 98 mg/dL (ref 70–99)

## 2020-06-27 LAB — TROPONIN I (HIGH SENSITIVITY)
Troponin I (High Sensitivity): 88 ng/L — ABNORMAL HIGH (ref <18)
Troponin I (High Sensitivity): 123 ng/L (ref <18)

## 2020-06-27 LAB — LACTIC ACID, PLASMA
Lactic Acid, Venous: 5 mmol/L (ref 0.5–1.9)
Lactic Acid, Venous: 2.1 mmol/L (ref 0.5–1.9)
Lactic Acid, Venous: 4.3 mmol/L (ref 0.5–1.9)

## 2020-06-27 LAB — MAGNESIUM: Magnesium: 1.9 mg/dL (ref 1.7–2.4)

## 2020-06-27 LAB — PHOSPHORUS
Phosphorus: 1.5 mg/dL — ABNORMAL LOW (ref 2.5–4.6)
Phosphorus: 4.8 mg/dL — ABNORMAL HIGH (ref 2.5–4.6)

## 2020-06-27 MED ORDER — ATROPINE SULFATE 1 MG/10ML IJ SOSY
PREFILLED_SYRINGE | INTRAMUSCULAR | Status: AC
  Filled 2020-06-27: qty 10

## 2020-06-27 MED ORDER — METOPROLOL TARTRATE 25 MG/10 ML ORAL SUSPENSION
25.0000 mg | Freq: Two times a day (BID) | ORAL | Status: DC
  Administered 2020-06-27 – 2020-07-09 (×25): 25 mg
  Filled 2020-06-27 (×27): qty 10

## 2020-06-27 MED ORDER — PRO-STAT SUGAR FREE PO LIQD
30.0000 mL | Freq: Two times a day (BID) | ORAL | Status: DC
  Filled 2020-06-27: qty 30

## 2020-06-27 MED ORDER — HYDROMORPHONE HCL 1 MG/ML IJ SOLN
0.5000 mg | INTRAMUSCULAR | Status: DC | PRN
  Administered 2020-06-27 – 2020-07-06 (×32): 0.5 mg via INTRAVENOUS
  Filled 2020-06-27 (×34): qty 0.5

## 2020-06-27 MED ORDER — BISACODYL 5 MG PO TBEC
10.0000 mg | DELAYED_RELEASE_TABLET | Freq: Every day | ORAL | Status: DC
  Administered 2020-06-27: 10 mg via ORAL
  Filled 2020-06-27: qty 2

## 2020-06-27 MED ORDER — POLYETHYLENE GLYCOL 3350 17 G PO PACK: 17.0000 g | PACK | Freq: Two times a day (BID) | ORAL | Status: DC

## 2020-06-27 MED ORDER — IOHEXOL 350 MG/ML SOLN
100.0000 mL | Freq: Once | INTRAVENOUS | Status: AC | PRN
  Administered 2020-06-27: 100 mL via INTRAVENOUS

## 2020-06-27 MED ORDER — APIXABAN 2.5 MG PO TABS: 2.5000 mg | ORAL_TABLET | Freq: Two times a day (BID) | ORAL | Status: DC

## 2020-06-27 MED ORDER — SODIUM CHLORIDE 0.9 % IV BOLUS
1000.0000 mL | Freq: Once | INTRAVENOUS | Status: AC
  Administered 2020-06-27: 1000 mL via INTRAVENOUS

## 2020-06-27 MED ORDER — ENOXAPARIN SODIUM 300 MG/3ML IJ SOLN
1.0000 mg/kg | INTRAMUSCULAR | Status: DC
  Filled 2020-06-27: qty 0.35

## 2020-06-27 MED ORDER — HYDROMORPHONE HCL 1 MG/ML IJ SOLN: 0.5000 mg | INTRAMUSCULAR | Status: DC | PRN

## 2020-06-27 MED ORDER — HYDROCODONE-ACETAMINOPHEN 7.5-325 MG/15ML PO SOLN: 15.0000 mL | Freq: Three times a day (TID) | ORAL | Status: DC | PRN

## 2020-06-27 MED ORDER — ATORVASTATIN CALCIUM 10 MG PO TABS: 20.0000 mg | ORAL_TABLET | Freq: Every day | ORAL | Status: DC

## 2020-06-27 MED ORDER — OSMOLITE 1.5 CAL PO LIQD
237.0000 mL | Freq: Every day | ORAL | Status: DC
  Administered 2020-06-28: 120 mL
  Filled 2020-06-27 (×12): qty 237

## 2020-06-27 MED ORDER — POTASSIUM PHOSPHATES 15 MMOLE/5ML IV SOLN
40.0000 mmol | Freq: Once | INTRAVENOUS | Status: AC
  Administered 2020-06-27: 40 mmol via INTRAVENOUS
  Filled 2020-06-27: qty 13.33

## 2020-06-27 MED ORDER — DRONEDARONE HCL 400 MG PO TABS
400.0000 mg | ORAL_TABLET | Freq: Two times a day (BID) | ORAL | Status: DC
  Administered 2020-06-28 – 2020-07-09 (×24): 400 mg
  Filled 2020-06-27 (×27): qty 1

## 2020-06-27 NOTE — Progress Notes (Signed)
CRITICAL VALUE ALERT  Critical Value:  Lactic 5.0  Date & Time Notied:  06/27/20 -- 4562  Provider Notified: Dr.Nguyen  Orders Received/Actions taken:  awaiting

## 2020-06-27 NOTE — Progress Notes (Signed)
ANTICOAGULATION CONSULT NOTE - Initial Consult  Pharmacy Consult for enoxaparin Indication: atrial fibrillation  Allergies  Allergen Reactions  . 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 Flomax [Tamsulosin Hcl]     Dizzy    Patient Measurements: Height: 5\' 9"  (175.3 cm) Weight: 37.2 kg (82 lb 0.2 oz) IBW/kg (Calculated) : 70.7  Vital Signs: Temp: 99.3 F (37.4 C) (07/08 1401) Temp Source: Oral (07/08 1401) BP: 119/96 (07/08 1401) Pulse Rate: 97 (07/08 1401)  Labs: Recent Labs    06/25/20 0612 06/25/20 0612 06/26/20 0453 06/26/20 0453 06/27/20 0305 06/27/20 1426 06/27/20 1648  HGB 9.2*   < > 8.9*   < > 8.2* 9.6*  --   HCT 27.3*   < > 27.0*  --  24.7* 30.1*  --   PLT 239   < > 210  --  216 230  --   LABPROT 15.3*  --   --   --   --   --   --   INR 1.3*  --   --   --   --   --   --   CREATININE 1.26*   < > 1.31*  --  1.21 1.16  --   TROPONINIHS  --   --   --   --   --   --  88*   < > = values in this interval not displayed.    Estimated Creatinine Clearance: 27.6 mL/min (by C-G formula based on SCr of 1.16 mg/dL).  Assessment: 79 yo m w afib - was initially changed from pradaxa to apixaban  Now MD wants to change to enoxaparin  CrCl < 30  Plan:  Dc apixaban enox 1 mg/kg q24h to start fri am  Barth Kirks, PharmD, BCPS, BCCCP Clinical Pharmacist [phone]  Please check AMION for all [place] numbers  06/27/2020 6:40 PM

## 2020-06-27 NOTE — Progress Notes (Signed)
PT Cancellation Note  Patient Details Name: Daniel [name] MRN: [id number] DOB: November 17, 1941   Cancelled Treatment:    Reason Eval/Treat Not Completed: Other (comment).  Has medically declined, on O2 and moved from [name] today to [name].  Will message MD for further guidance on his care.  Follow up as indicated with PT.   Ramond Dial 06/27/2020, 6:12 PM   Mee Hives, PT MS Acute Rehab Dept. Number: [place]

## 2020-06-27 NOTE — Progress Notes (Signed)
Paged for patient having sudden onset tremors and feeling very cold. Patient seen at bedside and unable to say much due to tremors. History difficult to obtain due to tremors. He denies chest pain or cough, endorses shortness of breath and abdominal pain. Tachy to 140s, BP 119/96, temperature 99, O2 88% on RA. Full body tremors made EKG difficult to obtain. Diffusely TTP in the abdomen, wet breath sounds but mostly upper airway. He was given 1mg  dilaudid for his pain, with improvement in pain, resolution of tremors and tachycardia. He is remaining on 3L O2 with oxygen saturation of 94%.  PEG tube was placed 7/6 with no issue with tube feeds. No changes in medications except for addition of dulcolax due to constipation. This is partially likely secondary to his pain but would not expect this level of pain with peg tube placement two days ago. Differential also includes perforation and aspiration pneumonia and will obtain labs and imaging.    - BMP, CBC, phos  - repeat EKG  - CXR with hypoxia - stop tube feeds for now - abdominal xr   Eriel Dunckel A, DO 06/27/2020, 3:03 PM Pager: [phone]

## 2020-06-27 NOTE — Progress Notes (Signed)
Patient transfer from [place] drowsy on 2 Ls of 02

## 2020-06-27 NOTE — TOC Progression Note (Addendum)
Transition of Care [facility]) - Progression Note    Patient Details  Name: Daniel [name] MRN: [id number] Date of Birth: [birth year]  Transition of Care [facility]) CM/SW Contact  Jacalyn Lefevre Edson Snowball, RN Phone Number: 06/27/2020, 12:39 PM  Clinical Narrative:      Spoke with patient, and son at bedside. Son would like [facility] for home health if they are unable to accept , they would like [facility].   Malachy Mood with [facility] could accept but start of care would be Monday.  [place] with [facility] checking to see when their start of care will be. Start of care would be Monday for [facility] also. Home health arranged with [facility].   Messaged MD regarding discharge date ( home health needs to know to see if they can staff referral) and also TF at home bolus, cyclic, continuous.   Will need home health orders and Tube feeding orders    Plan discharge is for Saturday . Pam with [facility] aware. Expected Discharge Plan: [place] Barriers to Discharge: Continued Medical Work up  Expected Discharge Plan and Services Expected Discharge Plan: [place]   Discharge Planning Services: CM Consult Post Acute Care Choice: [place] arrangements for the past 2 months: Apartment                 DME Arranged: Tube feeding, Tube feeding pump         HH Arranged: RN, PT           Social Determinants of Health (SDOH) Interventions    Readmission Risk Interventions No flowsheet data found.

## 2020-06-27 NOTE — Progress Notes (Signed)
With patients worsening status and prognosis in the setting of diagnosis of SCC, lactic acidosis and likely ileus, goals of care discussion was done at the bedside with Daniel [name] and his son Daniel [name]. Daniel [name] had previously expressed the desire to be DNR on admission but yesterday decided on partial code with no intubation. Today on further evaluating their goals with this decision, the patient's son stated he was worried that DNR meant his father would not be treated if he had a heart attack or something similar but knows he would not want a breathing tube. We discussed with Daniel [name] and his son that DNR does not mean he would not be treated for any medical condition that occurs but specifically that if his heart were to cease functioning, he would not undergo CPR with chest compressions and other measures to restart his heart. We further discussed that with his current severe protein malnutrition, deconditioning, cachexia, and other comorbidities including recent cancer diagnosis, he would be very unlikely to do well with CPR in the long term and that it is very few cases where resuscitation would not require a breathing tube. After this explanation the patient and his son decided to return status to DNR. Questions were answered and Daniel [name] and Daniel [name] will reach out if they have questions and understand they can change the code status at any time.  We also discussed once again the role of palliative care and the importance of this in the setting of his illness and recent cancer diagnosis and both agreed consultation would be beneficial.   [name] A, DO 06/27/2020, 7:35 PM Pager: [phone]

## 2020-06-27 NOTE — Progress Notes (Signed)
Nutrition Follow-up  DOCUMENTATION CODES:   Severe malnutrition in context of chronic illness, Underweight  INTERVENTION:  ContinueEnsure Enlive po BID, each supplement provides 350 kcal and 20 grams of protein  Continue Osmolite 1.5 formula via G-tubeat goal rate of 50 ml/hr to provide 1900 kcal (100% of kcal needs), 75 grams of protein (88% of protein needs), and 912 ml free water.  Starting tomorrow 7/9, transition to bolus tube feeds, At 1000 using Osmolite 1.5 cal formula start bolus tube feeds at starting volume of 120 ml (half carton/ARC) and increase to goal at the next scheduled feeding to goal volume of 237 ml (1 carton/ARC) given 5 times daily per G-tube. Bolus feeds to provide 1778 kcal, 74 grams of protein, and 901 ml free water.   Monitor magnesium, potassium, and phosphorus daily for at least 2 more days, MD to replete as needed, as pt is at risk for refeeding.  NUTRITION DIAGNOSIS:   Severe Malnutrition related to chronic illness as evidenced by severe fat depletion, severe muscle depletion; ongoing  GOAL:   Patient will meet greater than or equal to 90% of their needs; met with TF  MONITOR:   TF tolerance, PO intake, Supplement acceptance, Weight trends, Labs, I & O's, Skin  REASON FOR ASSESSMENT:   Consult Assessment of nutrition requirement/status  ASSESSMENT:   79 year old man with a history of PUD, tobacco use, chronic A. fib, PAD, and HTN admitted for hyponatremia with plans for EGD for odynophagia. EGD showed acircumferential, near obstructing, ulcerated mass in the midesophagus. G-tube placed 7/6.   Pt has been tolerating his tube feeds well at goal rate of 50 ml/hr. MD to continue to monitor refeeding labs. Noted phosphous lab this AM was low at 1.5, however has since been repleted. Plans to transition current continuous tube feeds to bolus tube feeds tomorrow in anticipation for ease at home upon discharge. Pt continues on a soft oral diet. Pt has  been consuming his Ensure shakes.   Labs and medications reviewed.   NUTRITION - FOCUSED PHYSICAL EXAM:    Most Recent Value  Orbital Region Unable to assess  Upper Arm Region Severe depletion  Thoracic and Lumbar Region Severe depletion  Buccal Region Unable to assess  Temple Region Unable to assess  Clavicle Bone Region Severe depletion  Clavicle and Acromion Bone Region Severe depletion  Scapular Bone Region Severe depletion  Dorsal Hand Unable to assess  Patellar Region Severe depletion  Anterior Thigh Region Severe depletion  Posterior Calf Region Severe depletion  Edema (RD Assessment) None  Hair Reviewed  Eyes Reviewed  Mouth Reviewed  Skin Reviewed  Nails Reviewed       Diet Order:   Diet Order            DIET SOFT Room service appropriate? Yes; Fluid consistency: Thin  Diet effective now                 EDUCATION NEEDS:   Not appropriate for education at this time  Skin:  Skin Assessment: Reviewed RN Assessment  Last BM:  Unknown  Height:   Ht Readings from Last 1 Encounters:  06/21/20 5' 9"  (1.753 m)    Weight:   Wt Readings from Last 1 Encounters:  06/27/20 37.2 kg    Ideal Body Weight:  72.7 kg  BMI:  Body mass index is 12.11 kg/m.  Estimated Nutritional Needs:   Kcal:  1610-9604  Protein:  85-95 grams  Fluid:  >/= 1.7 L/day  Corrin Parker,  MS, RD, LDN RD pager number/after hours weekend pager number on Amion.

## 2020-06-27 NOTE — Progress Notes (Signed)
RN called Wife [name] and updated her on the patient and that he had been moved to [facility]. Son Gorden Harms did not answer his phone [name] stated that he broke his phone and is getting a new one.

## 2020-06-27 NOTE — Progress Notes (Addendum)
Subjective: Patient is examined at bedside. He states that his pain has improved with the medications. He also complains of constipation which requires a lot of straining. Patient states that he was taking stool softener 3 times a day at home and it helped. His sleep also improves slightly.   We discussed pathology results and patient was calm and understanding. We also discussed palliative care option. Patient states that he will need some time before making the decision.  Objective:  Vital signs in last 24 hours: Vitals:   06/26/20 0846 06/26/20 1835 06/27/20 0029 06/27/20 0549  BP: 118/68 (!) 109/56 (!) 110/56 (!) 114/58  Pulse:  70 69 68  Resp:  20 17 16   Temp:  98.8 F (37.1 C) 98.6 F (37 C) 98.2 F (36.8 C)  TempSrc:  Oral Oral Oral  SpO2:  97% 100% 98%  Weight:    37.2 kg  Height:        Physical Exam  Physical Exam Constitutional:      Comments: frail  HENT:     Head: Normocephalic.  Eyes:     General: No scleral icterus.    Conjunctiva/sclera: Conjunctivae normal.  Cardiovascular:     Rate and Rhythm: Normal rate and regular rhythm.     Heart sounds: Normal heart sounds.  Pulmonary:     Effort: No respiratory distress.     Breath sounds: Normal breath sounds.  Abdominal:     General: Bowel sounds are normal.     Tenderness: There is abdominal tenderness. There is no guarding or rebound.     Comments: G-tube in place. Mild tenderness to palpation around the G-tube  Musculoskeletal:     Right lower leg: No edema.     Left lower leg: No edema.  Skin:    General: Skin is warm.     Coloration: Skin is not jaundiced.  Neurological:     Mental Status: He is alert.  Psychiatric:        Mood and Affect: Mood normal.     Assessment/Plan: Daniel [name] is a 79 year old man with a history of PUD, tobacco use, chronic A. fib, PAD, and HTN admitted for hyponatremia incidentally discovered in anticipation of EGD for odynophagia. EGD showed an mid-esophageal  mass consistent with poorly differentiated SCC.  Principal Problem:   Hyponatremia Active Problems:   Atrial fibrillation status post cardioversion (HCC)   Lung nodule   Moderate protein-calorie malnutrition (HCC)   Odynophagia   Goals of care, counseling/discussion   Esophageal cancer, stage IIB (HCC)   Esophageal SCC - EGD showed a circumferential, near obstructing, ulcerated mass in the midesophagus 22 cm from the incisors and extends 4-5 cm. CT head, abdomen and pelvis shows no metastatic findings.  - Pathology reports shows poorly differentiated SCC.  - Oncology Dr. Marin Olp thinks that patient is not a candidate for intervention at the moment given his nutritional status. Planned to start treatment when improved nutritional and functional status. - Hydrocodone 7.5/325 mg and Dilaudid 1 mg IV PRN for pain.  - Ramelteon qHS for sleep aid - Constipated, will increase Miralax to BID and add bisacodyl 10 mg daily - GI recommend EUS outpatient procedure. Plan 08/08/20. - Discussed palliative care with patient. Patient would like some time to think before making the decision.    Malnutrition  - Due to dysphagia and low solute intake. Pre-albumin of 13.8 - G-tube placed and nutrition education in place. Feeding with Ensure, Pro stat and Osmolite with goal rate of  50 ml/h per dietician recommendations. Will try to consolidate to bolus feed for patient convenience at home. His wife who was a nurse will help him at home.  - Monitor for refeeding syndrome. Daily Mag, Phos and Potassium for 3 days. - Continue PPI for GI prophylaxis - Switch his medications to suspension form concerning his dysphagia. Pradaxa was switched to Eliquis per pharmacy  - Continue hydration with LR, will taper down as feeding reaches goal.    Hypercalcemia - Inappropriately Normal PTH. Normal vitamin D level. Elevated ionized calcium level - Pending PTHr peptide level - Calcium level improves 11.4 - 10.7 after  Zometa 4 mg once.  - Recheck Ca level tomorrow    Normocytic anemia - Iron studies revealed ferritin 131, TIBC normal at 272, iron saturation 5, iron 13. Vitamin U04, folic acid unremarkable. Combination of iron deficiency anemia and inflammation, secondary to ulcerated esophageal mass. - Feraheme 510 mg IV given 06/25/20 - Continue CBC daily.    Paroxysmal a-fib  - Heart rate within normal limit - Continue Metoprolol and Dronedarone  - Eliquis for anticoagulation   Hyponatremia- resolved - hypotonic, euvolemic hyponatremia.  Likely chronic and multifactorial with limited solute intake, but mildly elevated urine osmolality of 212 suggests some degree of inappropriate ADH activity. -  Encourage patient to intake more protein with Ensure and Pro Stat. G-tube will be helpful for additional nutrition. - Continue to monitor   Diet: soft diet Fluid: LR DVT: Eliquis Code: DNR   Prior to Admission Living Arrangement: home Anticipated Discharge Location: home health PT Barriers to Discharge: G-tube and nutrition teaching, refeeding syndrome monitor Dispo: Anticipated discharge in approximately 3-4 day(s).     [name] Gerold, DO 06/27/2020, 8:25 AM Pager: [phone] After 5pm on weekdays and 1pm on weekends: On Call pager [phone]

## 2020-06-27 NOTE — Discharge Instructions (Addendum)

## 2020-06-28 DIAGNOSIS — Z7189 Other specified counseling: Secondary | ICD-10-CM

## 2020-06-28 DIAGNOSIS — L8990 Pressure ulcer of unspecified site, unspecified stage: Secondary | ICD-10-CM | POA: Diagnosis present

## 2020-06-28 DIAGNOSIS — Z66 Do not resuscitate: Secondary | ICD-10-CM

## 2020-06-28 DIAGNOSIS — Z515 Encounter for palliative care: Secondary | ICD-10-CM

## 2020-06-28 LAB — GLUCOSE, CAPILLARY
Glucose-Capillary: 103 mg/dL — ABNORMAL HIGH (ref 70–99)
Glucose-Capillary: 75 mg/dL (ref 70–99)
Glucose-Capillary: 78 mg/dL (ref 70–99)
Glucose-Capillary: 86 mg/dL (ref 70–99)
Glucose-Capillary: 86 mg/dL (ref 70–99)
Glucose-Capillary: 99 mg/dL (ref 70–99)

## 2020-06-28 LAB — BASIC METABOLIC PANEL
Anion gap: 5 (ref 5–15)
BUN: 16 mg/dL (ref 8–23)
CO2: 25 mmol/L (ref 22–32)
Calcium: 8.8 mg/dL — ABNORMAL LOW (ref 8.9–10.3)
Chloride: 109 mmol/L (ref 98–111)
Creatinine, Ser: 0.99 mg/dL (ref 0.61–1.24)
GFR calc Af Amer: >60 mL/min (ref >60)
GFR calc non Af Amer: >60 mL/min (ref >60)
Glucose, Bld: 88 mg/dL (ref 70–99)
Potassium: 3.9 mmol/L (ref 3.5–5.1)
Sodium: 139 mmol/L (ref 135–145)

## 2020-06-28 LAB — CBC
HCT: 24.1 % — ABNORMAL LOW (ref 39.0–52.0)
Hemoglobin: 7.7 g/dL — ABNORMAL LOW (ref 13.0–17.0)
MCH: 32.2 pg (ref 26.0–34.0)
MCHC: 32 g/dL (ref 30.0–36.0)
MCV: 100.8 fL — ABNORMAL HIGH (ref 80.0–100.0)
Platelets: 197 10*3/uL (ref 150–400)
RBC: 2.39 MIL/uL — ABNORMAL LOW (ref 4.22–5.81)
RDW: 12.9 % (ref 11.5–15.5)
WBC: 7.5 10*3/uL (ref 4.0–10.5)
nRBC: 0 % (ref 0.0–0.2)

## 2020-06-28 LAB — MAGNESIUM: Magnesium: 1.7 mg/dL (ref 1.7–2.4)

## 2020-06-28 LAB — PHOSPHORUS: Phosphorus: 2.2 mg/dL — ABNORMAL LOW (ref 2.5–4.6)

## 2020-06-28 MED ORDER — SODIUM CHLORIDE 0.9 % IV BOLUS: 1000.0000 mL | Freq: Once | INTRAVENOUS | Status: DC

## 2020-06-28 MED ORDER — ACETAMINOPHEN 160 MG/5ML PO SOLN
500.0000 mg | Freq: Three times a day (TID) | ORAL | Status: DC
  Filled 2020-06-28 (×2): qty 20.3

## 2020-06-28 MED ORDER — POLYETHYLENE GLYCOL 3350 17 G PO PACK
17.0000 g | PACK | Freq: Two times a day (BID) | ORAL | Status: DC
  Administered 2020-06-29 – 2020-06-30 (×2): 17 g
  Filled 2020-06-28 (×3): qty 1

## 2020-06-28 MED ORDER — MAGNESIUM SULFATE 2 GM/50ML IV SOLN
2.0000 g | Freq: Once | INTRAVENOUS | Status: AC
  Administered 2020-06-28: 2 g via INTRAVENOUS
  Filled 2020-06-28: qty 50

## 2020-06-28 MED ORDER — THIAMINE HCL 100 MG/ML IJ SOLN
100.0000 mg | Freq: Every day | INTRAMUSCULAR | Status: DC
  Administered 2020-06-28: 100 mg via INTRAVENOUS
  Filled 2020-06-28: qty 2

## 2020-06-28 MED ORDER — BISACODYL 10 MG RE SUPP: 10.0000 mg | Freq: Every day | RECTAL | Status: DC | PRN

## 2020-06-28 MED ORDER — LORAZEPAM 2 MG/ML IJ SOLN: 1.0000 mg | INTRAMUSCULAR | Status: DC

## 2020-06-28 MED ORDER — ENOXAPARIN SODIUM 30 MG/0.3ML ~~LOC~~ SOLN
30.0000 mg | SUBCUTANEOUS | Status: DC
  Administered 2020-06-28 – 2020-06-30 (×3): 30 mg via SUBCUTANEOUS
  Filled 2020-06-28 (×3): qty 0.3

## 2020-06-28 MED ORDER — SODIUM PHOSPHATES 45 MMOLE/15ML IV SOLN
30.0000 mmol | Freq: Once | INTRAVENOUS | Status: AC
  Administered 2020-06-28: 30 mmol via INTRAVENOUS
  Filled 2020-06-28: qty 10

## 2020-06-28 MED ORDER — ACETAMINOPHEN 160 MG/5ML PO SOLN
500.0000 mg | Freq: Three times a day (TID) | ORAL | Status: DC
  Administered 2020-06-28 – 2020-07-08 (×29): 500 mg
  Filled 2020-06-28 (×32): qty 20.3

## 2020-06-28 MED ORDER — SORBITOL 70 % SOLN
960.0000 mL | TOPICAL_OIL | Freq: Once | ORAL | Status: DC
  Filled 2020-06-28 (×2): qty 473

## 2020-06-28 MED ORDER — SENNA 8.6 MG PO TABS
2.0000 | ORAL_TABLET | Freq: Every day | ORAL | Status: DC
  Administered 2020-06-28 – 2020-06-29 (×2): 17.2 mg
  Filled 2020-06-28 (×2): qty 2

## 2020-06-28 MED ORDER — SODIUM CHLORIDE 0.9 % IV BOLUS
1000.0000 mL | Freq: Once | INTRAVENOUS | Status: AC
  Administered 2020-06-28: 1000 mL via INTRAVENOUS

## 2020-06-28 NOTE — Plan of Care (Signed)
  Problem: Education: Goal: Knowledge of General Education information will improve Description: Including pain rating scale, medication(s)/side effects and non-pharmacologic comfort measures Outcome: Progressing   Problem: Health Behavior/Discharge Planning: Goal: Ability to manage health-related needs will improve Outcome: Progressing   Problem: Activity: Goal: Risk for activity intolerance will decrease Outcome: Progressing   Problem: Nutrition: Goal: Adequate nutrition will be maintained Outcome: Not Progressing

## 2020-06-28 NOTE — Consult Note (Signed)
Palliative Medicine Inpatient Consult Note  Reason for consult:  Goals of Care "Esophageal SCC, malnutrition"  HPI:  Per intake H&P --> Mr. 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 He has been having progressive odynophagia and dysphagia to solids. His family elected for gastrostomy tube placement during hospitalization. He has severe cachexia and presumptive Stg II esophageal cancer we are awaiting final biopsy results for confirmation.  Palliative care was asked to get involved to aid in goals of care conversations.    Clinical Assessment/Goals of Care: I have reviewed medical records including EPIC notes, labs and imaging, received report from bedside RN, assessed the patient. Kennon Rounds is a frail, elderly male who was not in any distress at the time of assessment.    I met with Kathe Becton and his son, Aggie Hacker at bedside to further discuss diagnosis prognosis, GOC, EOL wishes, disposition and options.   I introduced Palliative Medicine as specialized medical care for people living with serious illness. It focuses on providing relief from the symptoms and stress of a serious illness. The goal is to improve quality of life for both the patient and the family.  Kennon Rounds is from [place], [place]. originally. He moved to [place] at the age of 33 with his family and then transitioned back to [place] given that his family was initially from the area. He is married. He has four children, twelve grandchildren, and twelve great grandchildren. 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 He enjoys "bossing his family around" per his son. He does have a belief in God and ascribes to the [place].  In terms of  Ashar's bADL's prior to hospitalization he was able to perform all bADLs on his own. It was not until this hospital stay that he lost physical function per [facility]. While in there Tod was noted to be working with physical therapy, he was doing very well per the therapist and can likely go home with OP PT/OT and supervision. Kennon Rounds was driving up until a few months ago.   A detailed discussion was had today regarding advanced directives, there are none formally filed on [employer].    A MOST form was introduced though patients son endorsed that he would like to discuss this with his mother. Concepts specific to code status, artifical feeding and hydration, continued IV antibiotics and rehospitalization was had.  Patient and his son have decided for him to be a DNAR/DNI.   The difference between a aggressive medical intervention path  and a palliative comfort care path for this patient at this time was had.   Patients son shares "I know how this goes". I asked him to tell me more about this, he offered the same response. I asked him how he was feeling about his fathers current state and the probable diagnosis of esophageal cancer. He states that his ultimate goals would be for his father to be comfortable. I offered information on hospice which is a service preserved for patients in [place]'s condition. His son shares that he would want to see if he can get stronger and be a candidate for therapy. I shared my concern that this may not happen and that hospice would be a reasonable consideration.  We discussed for the present time for [place] to transition home with PT/OT and outpatient palliative care. If patient is not improving the plan from there would be to transition to home hospice. We discussed Palliative cares management of troubling symptoms like constipation, pain, and nausea. Described how they would be an extra layer of support to guide the patient and his family during this time.     Discussed the importance of continued conversation with family and their  medical providers regarding overall plan of care and treatment options, ensuring decisions are within the context of the patients values and GOCs.  Provided "Hard Choices for [employer]" booklet.   Decision Maker: Aris Georgia (son) [phone]  SUMMARY OF RECOMMENDATIONS   DNAR/DNI  TOC --> OP Palliative referral  Chaplain Support  Code Status/Advance Care Planning: DNAR/DNI   Symptom Management:  Failure to Thrive:  - Dietician involved  - G-tube feeding presently Constipation:               - Bisacodyl 52m PR Qday PRN Muscular Weakness:                 - Physical Therapy Evaluation                 - Occupational Therapy Evaluation Generalized Pain:  - Tylenol 5083mGtube ATC  Xerostomia:                 - Good oral care QShift          Spiritual:                 - Chaplain consult Need for emotional support:  - Utilize therapeutic listening  - Ensure safe space to express concerns  - Sit next to patient/family making eye contact    Palliative Prophylaxis:   Oral Care, constipation, mobility  Additional Recommendations (Limitations, Scope, Preferences):  Continue with current scope of care   Psycho-social/Spiritual:   Desire for further Chaplaincy support: Yes  Additional Recommendations: Education on Palliative care and hospice   Prognosis: Poor < 6 months, appropriate for hospice care  [facility]  PPS: 40% on gtube feeds now   This conversation/these recommendations were discussed with patient primary care team, Dr. RaRebeca AlertTime In: 0900 Time Out: 1010 Total Time: 70 Greater than 50%  of this time was spent counseling and coordinating care related to the above assessment and plan.  [facility] Team Cell Phone: [phone] utilize secure chat with  additional questions, if there is no response within 30 minutes please call the above phone number  Palliative Medicine Team providers are available by phone from 7am to 7pm daily and can be reached through the team cell phone.  Should this patient require assistance outside of these hours, please call the patient's attending physician.

## 2020-06-28 NOTE — Progress Notes (Signed)
Mr. Plessinger is now on 3 E.  He was moved over there yesterday.  I suspect that his decline is becoming a little more pronounced.  I am not sure exactly what happened to him.  He had been on tube feeds.  They have been stopped for right now.  Not on the there is any aspiration.  He had a CT angiogram done yesterday.  This did not show any evidence of pulmonary embolism.  He had mucus respiratory content in the left lower lobe bronchus.  This was concerning for aspiration.  He had diffuse mesenteric edema and anasarca.  There is mild cardiomegaly.  He has squamous cell carcinoma of the esophagus.  From the study so far, there is no obvious evidence of metastatic disease.  He may have stage III disease but I think a long way to know this is with a endoscopic ultrasound to look in the paraesophageal lymph nodes.  He just is not a candidate for any intervention at this point.  He just is not strong enough.  He really needs to get better nutrition so that he can tolerate treatment.  I think only treatment that he would be able to handle it would be radiation therapy.  His hemoglobin was only 7.7.  There may be an indication for transfusion if his hemoglobin keeps going down.  He is iron deficient.  I am pretty sure his we already received a dose of IV iron.  His son was with him.  I explained to him what was going on from my perspective.  Again I told him that his father just is not going to tolerate any chemotherapy in the current condition that he is in.  He really needs to improve his nutritional state.  I think the only way to do that is with tube feeds.  I am not sure if he is going to tolerate tube feeds.  I do not know if he has had any actual aspiration.  Currently, his temperature is 99.3.  Pulse 89.  Blood pressure 102/59.  His oral exam is somewhat dry.  There is no adenopathy in the neck.  Lungs are with some rhonchi bilaterally.  He has occasional wheezes.  He has decent air movement  bilaterally.  Cardiac exam regular rate and rhythm.  Abdomen is soft.  There may be a little bit of distention.  There is no guarding or rebound tenderness.  Extremities shows muscle atrophy in upper and lower extremities.  I realize that this is a very difficult situation.  Mr. Volante potentially has a curable cancer by the current staging studies.  I would have to say that he probably is stage II or stage III.  This is curable but cure requires very intensive therapy.  He will need radiation and chemotherapy.  There is absolutely no way that he is going to tolerate either of these at the present time.  When I first saw him, I thought that it would take at least 3 weeks before he would be a candidate for any kind of treatment.  Looking at him now, I am pretty sure it will take longer than 3 weeks.  In reality, I just do not know if he will ever be a candidate for intervention.  He will have to improve his nutritional state markedly before we would consider him a candidate for curative intervention.  I know he is trying his best.  His body just seems to be "beaten down" by this malignancy.  Lattie Haw, MD  2 Corinthians 10:17

## 2020-06-28 NOTE — Care Management Important Message (Signed)
Important Message  Patient Details  Name: Daniel [name] MRN: [id number] Date of Birth: 1941/02/22   Medicare Important Message Given:  Yes     Shelda Altes 06/28/2020, 8:16 AM

## 2020-06-28 NOTE — Progress Notes (Signed)
This chaplain responded to PMT consult for spiritual care.  At the time of the visit, both the Pt. and [name] are resting and declined a visit.  This chaplain will F/U with spiritual care as needed.

## 2020-06-28 NOTE — Progress Notes (Addendum)
Subjective:   Acutely decompensated last night with decreased responsiveness, dyspnea, tremors, and tachycardia.  Workup showed lactate 5, ABG 7.42/35/87/22, Phos 2.2, trop 88 and 123, EKG without acute changes, imaging revealed stool ball with distended bowels proximally and edematous mesentery with no evidence of aortic dissection/aneurysm and normal mesenteric perfusion. Improved with IV fluids and holding feeds. Code status clarified, DNR with full scope of care otherwise. Please see event note from Dr. Sharon Seller for details.   Patient examined at bedside. His son stayed the night with him. Reports pain in his abdomen located over the gastric tube, worse when he moves. Had a hard time sleeping last night.  Discussed with patient about treatment plan which include bowel rest and enema. Patient agrees with plan.  Objective:  Vital signs in last 24 hours: Vitals:   06/27/20 2049 06/27/20 2309 06/28/20 0604 06/28/20 0650  BP: (!) 101/55 (!) 110/58 (!) 102/59   Pulse: (!) 104 96 89   Resp: 20 18 20    Temp: 98.6 F (37 C) 99.1 F (37.3 C) 99.3 F (37.4 C)   TempSrc: Oral Oral Oral   SpO2: 99% 100% 100%   Weight:    44.1 kg  Height:        Physical Exam  Physical Exam Eyes:     General: No scleral icterus. Cardiovascular:     Rate and Rhythm: Normal rate and regular rhythm.     Heart sounds: Normal heart sounds.  Pulmonary:     Effort: No respiratory distress.  Abdominal:     General: Abdomen is flat. Bowel sounds are normal.     Palpations: There is no mass.     Tenderness: There is abdominal tenderness.     Comments: Tenderness to palpation around the G-tube  Musculoskeletal:     Right lower leg: No edema.     Left lower leg: No edema.  Skin:    General: Skin is warm.  Neurological:     Mental Status: He is alert.  Psychiatric:        Mood and Affect: Mood normal.     Assessment/Plan: Daniel [name] is a 79 year old man with a history of PUD, tobacco use,  chronic A. fib, PAD, and HTN admitted for hyponatremia incidentally discovered in anticipation of EGD for odynophagia. 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  Principal Problem:   Hyponatremia Active Problems:   Atrial fibrillation status post cardioversion (HCC)   Lung nodule   Protein-calorie malnutrition, severe (HCC)   Odynophagia   Goals of care, counseling/discussion   Esophageal cancer, stage IIB (HCC)   Pressure injury of skin  Refeeding syndrome - Hypophosphatemia, tremor, dyspnea, tachycardia could be related to refeeding syndrome. His constipation and stool impaction also a major cause. G-tube was started yesterday with goal rate 50 ml/h. - KUB shows dilated bowels which could be ileus. CTA abd/pelvis is negative for mesenteric ischemia. Mesenteric edema present with stool impaction in rectum. There is tiny gas pocket which maybe related to gastrotomy tube placement - Hold tube feed. Bowel rest for now. - SMOG enema to relieve stool impaction - Switch medications to IV. Metoprolol and Dronedarone can be given through G-tube per pharmacy.  - Continue to monitor for K, phos, and Mg daily. Replete if low.  - Lactic acid 5 - 2.1 - 4.3. 1 L bolus NS given this morning. Follow up with next lactic acid value. Continue 125 cc/h LR. - Continue on telemetry. Troponin 88 - 123. First  EKG show sinus some ST changes on V2-V3. Repeat EKG shows similar changes. CT chest is negative for aortic dissection and shows vascular calcification of LAD and RCA. Patient does not have any chest pain at the moment. Likely demand, continue to monitor for clinical symptoms and treat underlying disease  Esophageal SCC - EGD showed a circumferential, near obstructing, ulcerated mass in the midesophagus 22 cm from the incisors and extends 4-5 cm. CT head, abdomen and pelvis shows no metastatic findings.  - Pathology reports shows poorly differentiated SCC.  - Oncology Dr.  Marin Olp thinks that patient is not a candidate for intervention at the moment given his nutritional status. Planned to start treatment when improved nutritional and functional status. Holding tube feed at the moment for bowel rest. - Dilaudid 0.5 mg IV PRN Q3h for pain.  - GI recommend EUS outpatient procedure. Plan 08/08/20. - Consult palliative care to help with long term symptom management. Palliative team recommended transition to home with PT/OT and outpatient palliative care. Home hospice can be considered if patient is not doing better with above plan.    Malnutrition  - Due to dysphagia and low solute intake. Pre-albumin of 13.8 - G-tube placed and nutrition education in place. Feeding with Ensure, Pro stat and Osmolite with goal rate of 50 ml/h per dietician recommendations. Will try to consolidate to bolus feed for patient convenience at home. His wife who was a nurse will help him at home.  - Holding tube feed at the moment. Daily Mag, Phos and Potassium. - Continue hydration with LR 125 cc/h    Aspiration - CT chest shows LLL density and debris in the airway. Patient is afebrile. WBC 7.5. Pro-cal pending. Will hold off on antibiotics unless he develops signs of infection - NPO, aspiration precautions, SLP consult  Normocytic anemia - Iron studies revealed ferritin 131, TIBC normal at 272, iron saturation 5, iron 13. Vitamin V89, folic acid unremarkable. Combination of iron deficiency anemia and inflammation, secondary to ulcerated esophageal mass. - Feraheme 510 mg IV given 06/25/20 - Hgb drop to 7.7 today. Source of bleeding is likely the esophageal mass.   - Continue CBC everyday.  - Lovenox 30 mg for DVT prophylaxis. The benefit of DVT prophylaxis outweighs the bleeding risk. Transfuse if Hgb < 7   Hypercalcemia - Inappropriately Normal PTH. Normal vitamin D level. Elevated ionized calcium level - Pending PTHr peptide level - Zometa 4 mg given once. Calcium level 7.7 today -  Recheck Ca level tomorrow  Paroxysmal a-fib  - Heart rate within normal limit - Continue Metoprolol and Dronedarone  - Lovenox 30 mg SQ or anticoagulation.    Hyponatremia- resolved - hypotonic, euvolemic hyponatremia.  Likely chronic and multifactorial with limited solute intake, but mildly elevated urine osmolality of 212 suggests some degree of inappropriate ADH activity. -  Encourage patient to intake more protein with Ensure and Pro Stat. G-tube will be helpful for additional nutrition. - Continue to monitor   Diet: NPO Fluid: LR DVT: Lovenox 30 mg  Code: DNR   Prior to Admission Living Arrangement: home Anticipated Discharge Location: home health PT Barriers to Discharge: G-tube, refeeding syndrome monitor Dispo: Anticipated discharge in approximately 3-4 day(s).   [name] Gerold, DO 06/28/2020, 7:05 AM Pager: [phone] After 5pm on weekdays and 1pm on weekends: On Call pager [phone]

## 2020-06-28 NOTE — Progress Notes (Signed)
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 He has been having progressive odynophagia and dysphagia to solids and referred to GI for EGD with possible dilation. EGD showed esophageal ca and will be getting feeding tube placed today.  New onset of ileus.     PT Comments    Pt admitted with above diagnosis. Pt was able to ambulate in hallway with min guard assist with RW.  Son present and attentive to pt.  Pt stopped by bathroom and nurse aware pt wanted to sit a bit in the bathroom. son in room.   Pt currently with functional limitations due to the deficits listed below (see PT Problem List). Pt will benefit from skilled PT to increase their independence and safety with mobility to allow discharge to the venue listed below.     Follow Up Recommendations  Home health PT     Equipment Recommendations  Rolling walker with 5" wheels;3in1 (PT);Wheelchair (measurements PT);Wheelchair cushion (measurements PT);Hospital bed    Recommendations for Other Services       Precautions / Restrictions Precautions Precautions: Fall Restrictions Weight Bearing Restrictions: No    Mobility  Bed Mobility Overal bed mobility: Modified Independent             General bed mobility comments: increased time, HOB elevated  Transfers Overall transfer level: Needs assistance Equipment used: None Transfers: Sit to/from Stand Sit to Stand: Min guard;Min assist         General transfer comment: patient slightly unsteady with initial standing mobility  Ambulation/Gait Ambulation/Gait assistance: Min guard;Min  assist Gait Distance (Feet): 350 Feet Assistive device: Rolling walker (2 wheeled) Gait Pattern/deviations: Step-through pattern;Decreased stride length;Drifts right/left;Trunk flexed Gait velocity: decreased Gait velocity interpretation: <1.31 ft/sec, indicative of household ambulator General Gait Details: Pt at times flexes head neck and trunk and needs cues to stand tall.  Able to progress gait into hallway.  Cues for safety with rW.  Stopped by bahtroom once back in room and wanted PT to leave him there. Told pt to use the call bell in bathroomand son in room as well as nurse aware.    Stairs             Wheelchair Mobility    Modified Rankin (Stroke Patients Only)       Balance Overall balance assessment: Needs assistance Sitting-balance support: Feet supported;No upper extremity supported Sitting balance-Leahy Scale: Good     Standing balance support: During functional activity;Bilateral upper extremity supported Standing balance-Leahy Scale: Poor Standing balance comment: at increased fall risk, needed bil UE suport and external stedying.                             Cognition Arousal/Alertness: Awake/alert Behavior During Therapy: WFL for tasks assessed/performed Overall Cognitive Status: Within Functional Limits for tasks assessed                                        Exercises      General Comments General comments (skin  integrity, edema, etc.): son present and is dedicated to taking care of pt.  Son very attentive to his father.       Pertinent Vitals/Pain Pain Assessment: No/denies pain    Home Living                      Prior Function            PT Goals (current goals can now be found in the care plan section) Acute Rehab PT Goals Patient Stated Goal: to leave and go back home Progress towards PT goals: Progressing toward goals    Frequency    Min 3X/week      PT Plan Current plan remains  appropriate    Co-evaluation              AM-PAC PT "6 Clicks" Mobility   Outcome Measure  Help needed turning from your back to your side while in a flat bed without using bedrails?: None Help needed moving from lying on your back to sitting on the side of a flat bed without using bedrails?: None Help needed moving to and from a bed to a chair (including a wheelchair)?: A Little Help needed standing up from a chair using your arms (e.g., wheelchair or bedside chair)?: A Little Help needed to walk in hospital room?: A Little Help needed climbing 3-5 steps with a railing? : A Little 6 Click Score: 20    End of Session Equipment Utilized During Treatment: Gait belt Activity Tolerance: Patient tolerated treatment well Patient left: with call bell/phone within reach;with family/visitor present (in bathroom on toilet) Nurse Communication: Mobility status PT Visit Diagnosis: Muscle weakness (generalized) (M62.81);Unsteadiness on feet (R26.81)     Time: 4332-9518 PT Time Calculation (min) (ACUTE ONLY): 26 min  Charges:  $Gait Training: 23-37 mins                     Tyan Dy W,PT [place] Pager:  [phone]  Office:  [place] 06/28/2020, 3:58 PM

## 2020-06-28 NOTE — Progress Notes (Signed)
Patient refused early afternoon labs. Notify MD. Per MD, hold tube feeding until tomorrow for bowel to rest.

## 2020-06-28 NOTE — Progress Notes (Signed)
RN has been instructed by Dr. Collene Gobble to continue to hold tube feedings for now.  RN informed MD that pt continues to have dysphagia during this time.

## 2020-06-29 ENCOUNTER — Inpatient Hospital Stay (HOSPITAL_COMMUNITY): Payer: Medicare Other

## 2020-06-29 DIAGNOSIS — R7881 Bacteremia: Secondary | ICD-10-CM

## 2020-06-29 DIAGNOSIS — B961 Klebsiella pneumoniae [K. pneumoniae] as the cause of diseases classified elsewhere: Secondary | ICD-10-CM

## 2020-06-29 LAB — BLOOD CULTURE ID PANEL (REFLEXED)

## 2020-06-29 LAB — BASIC METABOLIC PANEL
Anion gap: 5 (ref 5–15)
BUN: 12 mg/dL (ref 8–23)
CO2: 24 mmol/L (ref 22–32)
Calcium: 8.2 mg/dL — ABNORMAL LOW (ref 8.9–10.3)
Chloride: 109 mmol/L (ref 98–111)
Creatinine, Ser: 0.95 mg/dL (ref 0.61–1.24)
GFR calc Af Amer: >60 mL/min (ref >60)
GFR calc non Af Amer: >60 mL/min (ref >60)
Glucose, Bld: 99 mg/dL (ref 70–99)
Potassium: 3.8 mmol/L (ref 3.5–5.1)
Sodium: 138 mmol/L (ref 135–145)

## 2020-06-29 LAB — GLUCOSE, CAPILLARY
Glucose-Capillary: 114 mg/dL — ABNORMAL HIGH (ref 70–99)
Glucose-Capillary: 89 mg/dL (ref 70–99)
Glucose-Capillary: 82 mg/dL (ref 70–99)
Glucose-Capillary: 89 mg/dL (ref 70–99)
Glucose-Capillary: 83 mg/dL (ref 70–99)
Glucose-Capillary: 85 mg/dL (ref 70–99)

## 2020-06-29 LAB — CBC
HCT: 23.5 % — ABNORMAL LOW (ref 39.0–52.0)
Hemoglobin: 7.6 g/dL — ABNORMAL LOW (ref 13.0–17.0)
MCH: 32.9 pg (ref 26.0–34.0)
MCHC: 32.3 g/dL (ref 30.0–36.0)
MCV: 101.7 fL — ABNORMAL HIGH (ref 80.0–100.0)
Platelets: 198 10*3/uL (ref 150–400)
RBC: 2.31 MIL/uL — ABNORMAL LOW (ref 4.22–5.81)
RDW: 12.9 % (ref 11.5–15.5)
WBC: 7.4 10*3/uL (ref 4.0–10.5)
nRBC: 0 % (ref 0.0–0.2)

## 2020-06-29 LAB — MAGNESIUM: Magnesium: 2 mg/dL (ref 1.7–2.4)

## 2020-06-29 LAB — PHOSPHORUS: Phosphorus: 2 mg/dL — ABNORMAL LOW (ref 2.5–4.6)

## 2020-06-29 MED ORDER — THIAMINE HCL 100 MG PO TABS
100.0000 mg | ORAL_TABLET | Freq: Every day | ORAL | Status: DC
  Administered 2020-06-29 – 2020-07-09 (×11): 100 mg
  Filled 2020-06-29 (×12): qty 1

## 2020-06-29 MED ORDER — SODIUM PHOSPHATES 45 MMOLE/15ML IV SOLN
20.0000 mmol | Freq: Once | INTRAVENOUS | Status: AC
  Administered 2020-06-29: 20 mmol via INTRAVENOUS
  Filled 2020-06-29: qty 6.67

## 2020-06-29 MED ORDER — BISACODYL 10 MG RE SUPP: 10.0000 mg | Freq: Every day | RECTAL | Status: DC

## 2020-06-29 MED ORDER — SODIUM CHLORIDE 0.9 % IV SOLN
2.0000 g | INTRAVENOUS | Status: DC
  Administered 2020-06-29 – 2020-07-01 (×3): 2 g via INTRAVENOUS
  Filled 2020-06-29 (×3): qty 20

## 2020-06-29 NOTE — Progress Notes (Addendum)
Subjective: Daniel [name] was examined at bedside this morning and was unsettled that his son is not at bedside to help care for him. He states that he can feel his stomach growling and is anticipating a bowel movement. He states that his pain is intermittent and it "comes and goes." Sometimes, he experiences a sharp pain across his stomach. Currently, he reports of pain that is short-living. Patient states that he had a bowel movement yesterday.   Objective:  Vital signs in last 24 hours: Vitals:   06/28/20 1630 06/28/20 2055 06/29/20 0041 06/29/20 0428  BP:  (!) 112/53 (!) 118/58 114/69  Pulse: 91 68 64 (!) 57  Resp:  19 19 18   Temp:  98.1 F (36.7 C) 98.2 F (36.8 C) (!) 97.5 F (36.4 C)  TempSrc:  Oral Oral Oral  SpO2: 100% 100% 100% (!) 78%  Weight:    46.6 kg  Height:        Physical Exam   Physical Exam Constitutional:      General: He is not in acute distress.    Appearance: He is not toxic-appearing.  HENT:     Head: Normocephalic.  Eyes:     General: No scleral icterus. Cardiovascular:     Rate and Rhythm: Normal rate and regular rhythm.     Heart sounds: Normal heart sounds.  Pulmonary:     Effort: No respiratory distress.  Abdominal:     General: There is no distension.     Tenderness: There is no abdominal tenderness.     Comments: Diminished bowel sound  Musculoskeletal:     Right lower leg: No edema.     Left lower leg: No edema.  Skin:    General: Skin is warm.  Neurological:     Mental Status: He is alert.  Psychiatric:        Mood and Affect: Mood normal.     Assessment/Plan:  Daniel [name] is a 79 year old man with a history of PUD, tobacco use, chronic A. fib, PAD, and HTN admitted for hyponatremia incidentally discovered in anticipation of EGD for odynophagia. 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 Goal is to advance tube feed and go home with home health PT and outpatient palliative care.   Principal  Problem:   Hyponatremia Active Problems:   Atrial fibrillation status post cardioversion (HCC)   Lung nodule   Protein-calorie malnutrition, severe (HCC)   Odynophagia   Goals of care, counseling/discussion   Esophageal cancer, stage IIB (HCC)   Pressure injury of skin   Palliative care by specialist   DNR (do not resuscitate)  Refeeding syndrome Hypophosphatemia, tremor, dyspnea, tachycardia could be related to refeeding syndrome. His constipation and stool impaction also a major cause. KUB shows dilated bowels which could be ileus. CTA abd/pelvis is negative for mesenteric ischemia. Mesenteric edema present with stool impaction in rectum.  - Diminished bowel sound. Repeat KUB shows non-obstructive bowel gas pattern. Will continue to hold tube feed today - When restarting tube feed, will advance slowly. Will work together with the dietician on this process - Continue bowel regimen to help with constipation: Miralax, Dulcolax suppository and Senna.  - Continue to monitor for K, phos, and Mg daily. Replete if low. - Added Thiamine 100 mg to help reduce risk of refeeding syndrome.   - Continue on telemetry   Klebsiella bacteremia Blood culture from 7/8 grew Klebsiella. This is likely from bacterial translocation secondary to stercoral colitis. Patient is afebrile. WBC  7.4. BP 119/65. Patient does not have clinical symptoms of gram negative bacteremia.   - Start Ceftriaxone 2g daily (day 1). Continue for 5 days if no further infectious signs  Esophageal SCC EGD showed a circumferential, near obstructing, ulcerated mass in the midesophagus 22 cm from the incisors and extends 4-5 cm. CT head, abdomen and pelvis shows no metastatic findings. 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 Oncology Dr. Marin Olp thinks that patient is not a candidate for intervention at the moment given his nutritional status. Planned to start treatment when improved nutritional and functional status.   -  Holding tube feed at the moment for bowel rest. - Dilaudid 0.5 mg IV PRN Q3h for pain. Patient reports good pain management with current meds.  - GI recommend EUS outpatient procedure. Plan 08/08/20. - Consult palliative care to help with long term symptom management. Palliative team recommended transition to home with PT/OT and outpatient palliative care. Home hospice can be considered if patient is not doing better with above plan.    Malnutrition  Due to dysphagia and low solute intake. Pre-albumin of 13.8. G-tube placed, nutrition on hold for now, will try to resume tomorrow with Ensure, Pro stat and Osmolite with goal rate of 50 ml/h. Will try to consolidate to bolus feed for patient convenience at home. His wife who was a nurse will help him at home.   - Holding tube feed at the moment. Daily Mag, Phos and Potassium. - Continue hydration with LR 125 cc/h - Give D50 if CBG drop lower than 70.     Aspiration - CT chest 7/8 showed debris in the airway, antibiotics as above - NPO, aspiration precautions, SLP consult   Normocytic anemia - Iron studies revealed ferritin 131, TIBC normal at 272, iron saturation 5, iron 13. Vitamin S01, folic acid unremarkable. Combination of iron deficiency anemia and inflammation, secondary to ulcerated esophageal mass. - Feraheme 510 mg IV given 06/25/20 - Continue CBC everyday.  - Lovenox 30 mg for DVT prophylaxis. The benefit of DVT prophylaxis outweighs the bleeding risk. Transfuse if Hgb < 7    Hypercalcemia - Inappropriately Normal PTH. Normal vitamin D level. Elevated ionized calcium level - Pending PTHr peptide level - Zometa 4 mg given once. Calcium 8.2 - Recheck Ca level tomorrow   Paroxysmal a-fib  - Heart rate within normal limit - Continue Metoprolol and Dronedarone through tube - Holding anticoagulation for now, on lovenox for DVT ppx, will resume AC when safe   Hyponatremia- resolved - hypotonic, euvolemic hyponatremia.  Likely chronic  and multifactorial with limited solute intake, but mildly elevated urine osmolality of 212 suggests some degree of inappropriate ADH activity. -  Encourage patient to intake more protein with Ensure and Pro Stat. G-tube will be helpful for additional nutrition. - Continue to monitor   Diet: NPO Fluid: LR DVT: Lovenox 30 mg  Code: DNR   Prior to Admission Living Arrangement: home Anticipated Discharge Location: home health PT Barriers to Discharge: G-tube, refeeding syndrome monitor Dispo: Anticipated discharge in approximately 3-4 day(s).   Daniel Gerold, DO 06/29/2020, 7:57 AM Pager: [phone] After 5pm on weekdays and 1pm on weekends: On Call pager [phone]

## 2020-06-29 NOTE — Progress Notes (Signed)
PHARMACY - PHYSICIAN COMMUNICATION CRITICAL VALUE ALERT - BLOOD CULTURE IDENTIFICATION (BCID)  Results for orders placed or performed during the hospital encounter of 06/19/20  Blood Culture ID Panel (Reflexed) (Collected: 06/27/2020  4:08 PM)  Result Value Ref Range   Enterococcus species NOT DETECTED NOT DETECTED   Listeria monocytogenes NOT DETECTED NOT DETECTED   Staphylococcus species NOT DETECTED NOT DETECTED   Staphylococcus aureus (BCID) NOT DETECTED NOT DETECTED   Streptococcus species NOT DETECTED NOT DETECTED   Streptococcus agalactiae NOT DETECTED NOT DETECTED   Streptococcus pneumoniae NOT DETECTED NOT DETECTED   Streptococcus pyogenes NOT DETECTED NOT DETECTED   Acinetobacter baumannii NOT DETECTED NOT DETECTED   Enterobacteriaceae species DETECTED (A) NOT DETECTED   Enterobacter cloacae complex NOT DETECTED NOT DETECTED   Escherichia coli NOT DETECTED NOT DETECTED   Klebsiella oxytoca NOT DETECTED NOT DETECTED   Klebsiella pneumoniae DETECTED (A) NOT DETECTED   Proteus species NOT DETECTED NOT DETECTED   Serratia marcescens NOT DETECTED NOT DETECTED   Carbapenem resistance NOT DETECTED NOT DETECTED   Haemophilus influenzae NOT DETECTED NOT DETECTED   Neisseria meningitidis NOT DETECTED NOT DETECTED   Pseudomonas aeruginosa NOT DETECTED NOT DETECTED   Candida albicans NOT DETECTED NOT DETECTED   Candida glabrata NOT DETECTED NOT DETECTED   Candida krusei NOT DETECTED NOT DETECTED   Candida parapsilosis NOT DETECTED NOT DETECTED   Candida tropicalis NOT DETECTED NOT DETECTED    Name of physician (or Provider) Contacted: Agyei  Changes to prescribed antibiotics required: Add Rocephin   Seif Teichert S. Alford Highland, PharmD, BCPS Clinical Staff Pharmacist [email or web address] [place] 06/29/2020  9:25 AM

## 2020-06-30 LAB — BASIC METABOLIC PANEL
Anion gap: 8 (ref 5–15)
BUN: 8 mg/dL (ref 8–23)
CO2: 19 mmol/L — ABNORMAL LOW (ref 22–32)
Calcium: 7.7 mg/dL — ABNORMAL LOW (ref 8.9–10.3)
Chloride: 109 mmol/L (ref 98–111)
Creatinine, Ser: 0.78 mg/dL (ref 0.61–1.24)
GFR calc Af Amer: >60 mL/min (ref >60)
GFR calc non Af Amer: >60 mL/min (ref >60)
Glucose, Bld: 91 mg/dL (ref 70–99)
Potassium: 3.2 mmol/L — ABNORMAL LOW (ref 3.5–5.1)
Sodium: 136 mmol/L (ref 135–145)

## 2020-06-30 LAB — PHOSPHORUS: Phosphorus: 1.4 mg/dL — ABNORMAL LOW (ref 2.5–4.6)

## 2020-06-30 LAB — GLUCOSE, CAPILLARY
Glucose-Capillary: 79 mg/dL (ref 70–99)
Glucose-Capillary: 95 mg/dL (ref 70–99)
Glucose-Capillary: 95 mg/dL (ref 70–99)
Glucose-Capillary: 109 mg/dL — ABNORMAL HIGH (ref 70–99)
Glucose-Capillary: 111 mg/dL — ABNORMAL HIGH (ref 70–99)

## 2020-06-30 LAB — CBC
HCT: 22.9 % — ABNORMAL LOW (ref 39.0–52.0)
Hemoglobin: 7.4 g/dL — ABNORMAL LOW (ref 13.0–17.0)
MCH: 32.6 pg (ref 26.0–34.0)
MCHC: 32.3 g/dL (ref 30.0–36.0)
MCV: 100.9 fL — ABNORMAL HIGH (ref 80.0–100.0)
Platelets: 185 10*3/uL (ref 150–400)
RBC: 2.27 MIL/uL — ABNORMAL LOW (ref 4.22–5.81)
RDW: 12.7 % (ref 11.5–15.5)
WBC: 7.2 10*3/uL (ref 4.0–10.5)
nRBC: 0 % (ref 0.0–0.2)

## 2020-06-30 LAB — MAGNESIUM: Magnesium: 1.8 mg/dL (ref 1.7–2.4)

## 2020-06-30 MED ORDER — SENNOSIDES 8.8 MG/5ML PO SYRP
5.0000 mL | ORAL_SOLUTION | Freq: Every morning | ORAL | Status: DC
  Administered 2020-06-30: 5 mL
  Filled 2020-06-30 (×3): qty 5

## 2020-06-30 MED ORDER — POTASSIUM PHOSPHATES 15 MMOLE/5ML IV SOLN
30.0000 mmol | Freq: Once | INTRAVENOUS | Status: AC
  Administered 2020-06-30: 30 mmol via INTRAVENOUS
  Filled 2020-06-30: qty 10

## 2020-06-30 MED ORDER — SENNA 8.6 MG PO TABS: 1.0000 | ORAL_TABLET | Freq: Every day | ORAL | Status: DC

## 2020-06-30 MED ORDER — PROSOURCE TF PO LIQD
45.0000 mL | Freq: Every day | ORAL | Status: DC
  Administered 2020-06-30 – 2020-07-04 (×5): 45 mL
  Filled 2020-06-30 (×5): qty 45

## 2020-06-30 MED ORDER — DOCUSATE SODIUM 50 MG/5ML PO LIQD
50.0000 mg | Freq: Every day | ORAL | Status: DC
  Administered 2020-06-30: 50 mg via ORAL
  Filled 2020-06-30 (×4): qty 10

## 2020-06-30 MED ORDER — OSMOLITE 1.5 CAL PO LIQD
1000.0000 mL | ORAL | Status: DC
  Administered 2020-06-30: 1000 mL
  Filled 2020-06-30 (×6): qty 1000

## 2020-06-30 NOTE — Progress Notes (Addendum)
Nutrition Follow-up  DOCUMENTATION CODES:   Severe malnutrition in context of chronic illness, Underweight  INTERVENTION:  Initiate Osmolite 1.5 formula via G-tube at starting rate of 20 ml/hr and increase by 10 ml every 10 hours to goal rate of 50 ml/hr.   Provide 45 ml Prosource TF once daily per tube.  Tube feeding to provide 1840 kcal (100% of needs), 86 grams of protein, and 912 ml free water.   Continue Ensure Enlive po BID as tolerated, each supplement provides 350 kcal and 20 grams of protein.  Monitor magnesium, potassium, and phosphorus, MD to replete as needed, as pt is at risk for refeeding syndrome given severe malnutrition.  NUTRITION DIAGNOSIS:   Severe Malnutrition related to chronic illness as evidenced by severe fat depletion, severe muscle depletion; ongoing  GOAL:   Patient will meet greater than or equal to 90% of their needs; progressing  MONITOR:   TF tolerance, PO intake, Supplement acceptance, Weight trends, Labs, I & O's, Skin  REASON FOR ASSESSMENT:   Consult Assessment of nutrition requirement/status  ASSESSMENT:   79 year old man with a history of PUD, tobacco use, chronic A. fib, PAD, and HTN admitted for hyponatremia with plans for EGD for odynophagia. EGD showed acircumferential, near obstructing, ulcerated mass in the midesophagus.G-tube placed 7/6.  RD working remotely.  7/8 KUB shows dilated bowels which could be ileus. Mesenteric edema present with stoolimpactionin rectum.  Per MD, KUB yesterday showed decreased gaseous distention. RD consulted to resume tube feeding at low and slow rate. Noted, pt was showing signs of refeeding with initial start of enteral nutrition. MD continues to monitor for refeeding labs. RD to modify tube feeding orders.   Labs and medications reviewed. Potassium low at 3.2. Phosphorous low at 1.4. Potassium and phosphorous repleting.  Diet Order:   Diet Order            DIET SOFT Room service  appropriate? Yes; Fluid consistency: Thin  Diet effective now                 EDUCATION NEEDS:   Not appropriate for education at this time  Skin:  Skin Assessment: Skin Integrity Issues: Skin Integrity Issues:: Stage II Stage II: R buttocks  Last BM:  7/9  Height:   Ht Readings from Last 1 Encounters:  06/21/20 5\' 9"  (1.753 m)    Weight:   Wt Readings from Last 1 Encounters:  06/30/20 48.5 kg    Ideal Body Weight:  72.7 kg  BMI:  Body mass index is 15.79 kg/m.  Estimated Nutritional Needs:   Kcal:  9628-3662  Protein:  85-95 grams  Fluid:  >/= 1.7 L/day  Daniel Parker, MS, RD, LDN RD pager number/after hours weekend pager number on Am[employer].

## 2020-06-30 NOTE — Progress Notes (Signed)
Hydrologist [facility])  Hospital Liaison: RN note         Notified by Tacey Ruiz, NP  of patient/family request for [facility] Palliative services at home after discharge.         Writer left message for son on VM. Will attempt to reach again later today or tomorrow.             [place] Palliative team will follow up with patient after discharge.         Please call with any hospice or palliative related questions.         Thank you for this referral.         Farrel Gordon, RN, CCM  [place] (listed on [place] under Hospice/[facility])    [phone]

## 2020-06-30 NOTE — Progress Notes (Signed)
Subjective: HD#9   Overnight: No acute overnight events reported  Today, Daniel [name] states that he feels pretty well. Pain in his chest has resolved, still having pain in his abdomen rated 2/10 which improves with pain meds. Denies n/v. Had 3-4 BMs yesterday.  Objective:  Vital signs in last 24 hours: Vitals:   06/29/20 1638 06/29/20 2217 06/30/20 0502 06/30/20 0511  BP: (!) 143/67 131/68 122/68   Pulse: 72 70 76   Resp: 18 18 17    Temp: 98.1 F (36.7 C) 98 F (36.7 C) 98.4 F (36.9 C)   TempSrc: Oral Oral Oral   SpO2: 100% 93% 100%   Weight:    48.5 kg  Height:       Const: In no apparent distress, lying comfortably in bed, conversational Resp: CTA BL, no wheezes, crackles, rhonchi CV: RRR, no murmurs, gallop, rub Abd: G-tube located at the left side of his abdomen, dressed without signs of leak.   Assessment/Plan:  Principal Problem:   Hyponatremia Active Problems:   Atrial fibrillation status post cardioversion (HCC)   Lung nodule   Protein-calorie malnutrition, severe (HCC)   Odynophagia   Goals of care, counseling/discussion   Esophageal cancer, stage IIB (HCC)   Pressure injury of skin   Palliative care by specialist   DNR (do not resuscitate)   Bacteremia due to Klebsiella pneumoniae   Daniel [name] a 79 year old man with a history of PUD, tobacco use, chronic A. fib, PAD, and HTN admitted for hyponatremia incidentally discovered in anticipation of EGD for odynophagia.EGD showed a mid-esophageal massconsistent with poorly differentiated SCC. Goal is to advance tube feed and go home with home health PT and outpatient palliative care.   Refeeding syndrome Hypophosphatemia, tremor, dyspnea, tachycardia could be related to refeeding syndrome.  - Resume tube feeds at a lower rate today - Continue to monitor for K, phos, and Mgdaily. Replete if low. - Continue thiamine 100 mg  - Continue on telemetry   Stool impaction Stercoral colitis He  is now having regular bowel movements and had 34 bowel movements yesterday.  He reports improvement to his abdominal pain - Continue bowel regimen with Colace, Senokot and MiraLAX   Klebsiella bacteremia Blood culture from 7/8 grew Klebsiella. This is likely from bacterial translocation secondary to stercoral colitis.  -Continue ceftriaxone 2g daily (day 2). Continue for 5 days if no further infectious signs   Esophageal SCC EGD showed acircumferential, near obstructing, ulcerated mass in the midesophagus 22 cm from the incisors and extends 4-5 cm. CT head, abdomen and pelvis shows no metastatic findings. 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 Domingo [name] that patient is not a candidate for intervention at the moment given his nutritional status.Planned to start treatment when improved nutritional and functional status.  -Dilaudid0.5mg  IVPRNQ3hfor pain. - GI recommend EUS outpatient procedure. Plan 08/08/20. -Consult palliative care to help with long term symptom management.Palliative team recommended transition to home with PT/OT and outpatient palliative care. Home hospice can be consideredif patient is not doing better with above plan.    Malnutrition Due to dysphagia and low solute intake.Pre-albumin of 13.8. G-tube placed, nutrition on hold for now, will try to resume tomorrow with Ensure, Pro stat and Osmolitewith goal rate of 50 ml/h. Will try to consolidate to bolus feed for patient convenience at home.His wife who was a nurse will help him at home.  - Resume tube feeds today daily Mag, Phos and Potassium. - Continue hydration with XT024 cc/h - Give D50 if CBG  drop lower than 70.    Normocytic anemia -Iron studies revealed ferritin 131, TIBC normal at 272, iron saturation 5, iron 13.Vitamin X67, folic acid unremarkable. Combination of iron deficiency anemia and inflammation, secondary to ulcerated esophageal mass. - Feraheme 510  mg IVgiven7/6/21 - Continue CBC everyday.  - Lovenox 30 mg for DVT prophylaxis. The benefit of DVT prophylaxis outweighs the bleeding risk. Transfuse if Hgb < 7   Hypercalcemia -InappropriatelyNormal PTH.Normalvitamin D level.Elevated ionized calcium level - Pending PTHr peptide level -Zometa 4 mggivenonce.Calcium 8.2   Hypophosphatemia Phosphorus level of 1.4 today -Replete with IV K-Phos 30 mmol  Paroxysmal a-fib  - Heart rate within normal limit - Continue Metoprololand Dronedaronethrough tube -Holding anticoagulation for now, on lovenox for DVT ppx, will resume AC when safe   Hyponatremia- resolved -hypotonic, euvolemic hyponatremia. Likely chronic and multifactorial with limited solute intake, but mildly elevated urine osmolality of 212 suggests some degree of inappropriate ADH activity. - Encourage patient to intakemore proteinwith Ensure and Pro Stat. G-tube will be helpful for additionalnutrition. -Continue to monitor   Diet: Tube feeds Fluid: LR SWV:TVNRWCH 30 mg Code: DNR  Prior to Admission Living Arrangement:home Anticipated Discharge Location:homehealth PT Barriers to Discharge:G-tube, refeeding syndrome monitor Dispo: Anticipated discharge in approximately3-4day(s).   [name], MD 06/30/2020, 6:26 AM Pager: [phone] Internal Medicine Teaching Service After 5pm on weekdays and 1pm on weekends: On Call pager: [phone]

## 2020-06-30 NOTE — Progress Notes (Signed)
  Date: 06/30/2020  Patient name: Daniel [name]  Medical record number: [id number]  Date of birth: Jul 05, 1941   I have seen and evaluated this patient and I have discussed the plan of care with the house staff. Please see their note for complete details. I concur with their findings with the following additions/corrections:   Continuing to have multiple BMs, asked to take a break from bowel regimen. "Tired of wallowing in it". Restarted tube feeds today without difficulty. Continuing antibiotics for klebsiella bacteremia.   Lenice Pressman, M.D., Ph.D. 06/30/2020, 3:41 PM

## 2020-06-30 NOTE — Progress Notes (Signed)
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 His family elected for gastrostomy tube placement during hospitalization. He has severe cachexia and presumptive Stg II esophageal cancer we are awaiting final biopsy results for confirmation.  Palliative care was asked to get involved to aid in goals of care conversations.   Today's Discussion (06/30/2020): Chart reviewed. Patient worked with PT yesterday, recommended to go home with [facility] PT. KUB appears stable. Phos remains low in the setting of refeeding syndrome which primary team is working with dietary on.   Patient noted to be in bed this morning without any distress. His son is at bedside. Plan for transition home with PT/Ot, he remains hopeful for improvement but "knows how it's going to go."  Reached out to OP Palliative liaison to verify they will meet with patient and his son to discuss their services.  Discussed the importance of continued conversation with family and their  medical providers regarding overall plan of care and treatment options, ensuring decisions are within the context of the patients values and GOCs.  Provided "Hard Choices for [employer]" booklet.   Questions and concerns addressed   SUMMARY OF RECOMMENDATIONS DNAR/DNI  TOC --> OP Palliative referral have reached out to [facility] liaison to verify patient gets enrolled. Likely transition to hospice if patient starts to further decline though the goals presently by the patient and family are to gain nutritional  value, strength, and remain active for as long as possible. Educated patient and family that OP Palliative can aid in management of troubling symptoms though also emphasized that in cases such as these considering hospice sooner than later can prove to be beneficial in the long term for greater symptomatic relief.   Chaplain Support  Palliative care will remain peripherally involved, please contact us with additional needs  Code Status/Advance Care Planning: DNAR/DNI  Symptom Management: Failure to Thrive:                 - Dietician involved                 - G-tube feeding presently Constipation:                 - Bisacodyl 10mg  PR Qday PRN Muscular Weakness: - Physical Therapy Evaluation - Occupational Therapy Evaluation Generalized Pain:                 - Tylenol 500mg  Gtube ATC Xerostomia: - Good oral care QShift Spiritual: - Chaplain consult Need for emotional support:                 - Utilize therapeutic listening                 - Ensure safe space to express concerns                 - Sit next to patient/family making eye contact  Time Spent: 25 Greater than 50% of the time was spent in counseling and coordination of care ______________________________________________________________________________________ [place] Team Team Cell Phone: [phone] Please utilize  secure chat with additional questions, if there is no response within 30 minutes please call the above phone number  Palliative Medicine Team providers are available by phone from 7am to 7pm daily and can be reached through the team cell phone.  Should this patient require assistance outside of these hours, please call the patient's attending physician.

## 2020-07-01 LAB — BASIC METABOLIC PANEL
Anion gap: 8 (ref 5–15)
BUN: 9 mg/dL (ref 8–23)
CO2: 20 mmol/L — ABNORMAL LOW (ref 22–32)
Calcium: 7.9 mg/dL — ABNORMAL LOW (ref 8.9–10.3)
Chloride: 105 mmol/L (ref 98–111)
Creatinine, Ser: 0.98 mg/dL (ref 0.61–1.24)
GFR calc Af Amer: >60 mL/min (ref >60)
GFR calc non Af Amer: >60 mL/min (ref >60)
Glucose, Bld: 82 mg/dL (ref 70–99)
Potassium: 3.5 mmol/L (ref 3.5–5.1)
Sodium: 133 mmol/L — ABNORMAL LOW (ref 135–145)

## 2020-07-01 LAB — GLUCOSE, CAPILLARY
Glucose-Capillary: 121 mg/dL — ABNORMAL HIGH (ref 70–99)
Glucose-Capillary: 104 mg/dL — ABNORMAL HIGH (ref 70–99)
Glucose-Capillary: 88 mg/dL (ref 70–99)
Glucose-Capillary: 122 mg/dL — ABNORMAL HIGH (ref 70–99)
Glucose-Capillary: 120 mg/dL — ABNORMAL HIGH (ref 70–99)
Glucose-Capillary: 73 mg/dL (ref 70–99)
Glucose-Capillary: 125 mg/dL — ABNORMAL HIGH (ref 70–99)

## 2020-07-01 LAB — CBC
HCT: 29.4 % — ABNORMAL LOW (ref 39.0–52.0)
Hemoglobin: 9.8 g/dL — ABNORMAL LOW (ref 13.0–17.0)
MCH: 33 pg (ref 26.0–34.0)
MCHC: 33.3 g/dL (ref 30.0–36.0)
MCV: 99 fL (ref 80.0–100.0)
Platelets: 258 10*3/uL (ref 150–400)
RBC: 2.97 MIL/uL — ABNORMAL LOW (ref 4.22–5.81)
RDW: 12.6 % (ref 11.5–15.5)
WBC: 9.3 10*3/uL (ref 4.0–10.5)
nRBC: 0 % (ref 0.0–0.2)

## 2020-07-01 LAB — MAGNESIUM: Magnesium: 2 mg/dL (ref 1.7–2.4)

## 2020-07-01 LAB — CULTURE, BLOOD (ROUTINE X 2)

## 2020-07-01 LAB — PHOSPHORUS: Phosphorus: 1.5 mg/dL — ABNORMAL LOW (ref 2.5–4.6)

## 2020-07-01 LAB — PTH-RELATED PEPTIDE: PTH-related peptide: <2 pmol/L

## 2020-07-01 MED ORDER — POTASSIUM PHOSPHATES 15 MMOLE/5ML IV SOLN
30.0000 mmol | Freq: Once | INTRAVENOUS | Status: AC
  Administered 2020-07-01: 30 mmol via INTRAVENOUS
  Filled 2020-07-01: qty 10

## 2020-07-01 MED ORDER — HYDROCODONE-ACETAMINOPHEN 7.5-325 MG/15ML PO SOLN: 15.0000 mL | Freq: Three times a day (TID) | ORAL | Status: DC | PRN

## 2020-07-01 MED ORDER — K PHOS MONO-SOD PHOS DI & MONO 155-852-130 MG PO TABS
500.0000 mg | ORAL_TABLET | Freq: Three times a day (TID) | ORAL | Status: DC
  Administered 2020-07-01 (×2): 500 mg via ORAL
  Filled 2020-07-01 (×4): qty 2

## 2020-07-01 MED ORDER — CEFAZOLIN SODIUM-DEXTROSE 2-4 GM/100ML-% IV SOLN
2.0000 g | Freq: Three times a day (TID) | INTRAVENOUS | Status: AC
  Administered 2020-07-02 – 2020-07-03 (×4): 2 g via INTRAVENOUS
  Filled 2020-07-01 (×4): qty 100

## 2020-07-01 MED ORDER — HYDROCODONE-ACETAMINOPHEN 7.5-325 MG/15ML PO SOLN
15.0000 mL | Freq: Three times a day (TID) | ORAL | Status: DC | PRN
  Administered 2020-07-01 – 2020-07-02 (×2): 15 mL
  Filled 2020-07-01 (×2): qty 15

## 2020-07-01 MED ORDER — APIXABAN 5 MG PO TABS
5.0000 mg | ORAL_TABLET | Freq: Two times a day (BID) | ORAL | Status: DC
  Administered 2020-07-01 – 2020-07-09 (×17): 5 mg via ORAL
  Filled 2020-07-01 (×17): qty 1

## 2020-07-01 MED ORDER — POTASSIUM CHLORIDE CRYS ER 20 MEQ PO TBCR: 40.0000 meq | EXTENDED_RELEASE_TABLET | Freq: Two times a day (BID) | ORAL | Status: DC

## 2020-07-01 MED ORDER — POLYETHYLENE GLYCOL 3350 17 G PO PACK: 17.0000 g | PACK | Freq: Every day | ORAL | Status: DC

## 2020-07-01 NOTE — Progress Notes (Addendum)
Subjective:   Hospital day: 10  Overnight event: No  Patient is seen at bedside today. He reports that pain is well controlled. He states that he does have joint pain if the room temperature is cold. Patient states that he does not have any appetite. Patient states that he has a lot of BM since yesterday.   Nurse Tech notified that his left forearm was slightly swollen when he put on the blood pressure cuff. Will hold medications running through that IV and continue using the IV on the R arm. Patient may need a PICC line or midline in the future.   Objective:  Vital signs in last 24 hours: Vitals:   06/30/20 1731 06/30/20 2118 07/01/20 0117 07/01/20 0610  BP: (!) 111/56 121/62 (!) 122/56 134/63  Pulse: 92 78 74 69  Resp: 18 18 20 20   Temp: 97.6 F (36.4 C) 98.6 F (37 C) 98.2 F (36.8 C) 98.2 F (36.8 C)  TempSrc: Oral Oral Oral Oral  SpO2: 98% 99% 95% 97%  Weight:      Height:        Physical Exam  Physical Exam Constitutional:      Comments: Frail appearance  HENT:     Head: Normocephalic.  Eyes:     General: No scleral icterus.    Conjunctiva/sclera: Conjunctivae normal.  Cardiovascular:     Rate and Rhythm: Normal rate and regular rhythm.     Heart sounds: Normal heart sounds.  Pulmonary:     Effort: No respiratory distress.  Abdominal:     General: Bowel sounds are normal. There is no distension.  Musculoskeletal:     Right lower leg: No edema.     Left lower leg: No edema.  Skin:    General: Skin is warm.     Coloration: Skin is not jaundiced.  Neurological:     Mental Status: He is alert.  Psychiatric:        Mood and Affect: Mood normal.     Assessment/Plan: Daniel [name] is a 79 year old man with a history of PUD, tobacco use, chronic A. fib, PAD, and HTN admitted for hyponatremia incidentally discovered in anticipation of EGD for odynophagia. 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 Goal is to advance tube  feed and go home with home health PT and outpatient palliative care.     Principal Problem:   Hyponatremia Active Problems:   Atrial fibrillation status post cardioversion (HCC)   Lung nodule   Protein-calorie malnutrition, severe (HCC)   Odynophagia   Goals of care, counseling/discussion   Esophageal cancer, stage IIB (HCC)   Pressure injury of skin   Palliative care by specialist   DNR (do not resuscitate)   Bacteremia due to Klebsiella pneumoniae  Refeeding syndrome Hypophosphatemia, tremor, dyspnea, tachycardia could be related to refeeding syndrome.    - Resume tube feeds at a lower rate today. Osmolite start at 20 ml/h, increase by 10 ml/h every 10 h. Goal at 50 ml/h.  - Continue to monitor for K, phos, and Mg daily. Replete phos with K-phos 30 mmol and K-Phos 500 mg TID PO or per tube.  - Continue thiamine 100 mg daily - Continue on telemetry    Stool impaction Stercoral colitis He is now having regular bowel movements and had 3-4 bowel movements yesterday.  He reports improvement to his abdominal pain.   - Will hold bowel regimen bisacodyl, Senokot and MiraLAX today. Patient had many BM in the last 24h.  Klebsiella bacteremia Blood culture from 7/8 grew Klebsiella. This is likely from bacterial translocation secondary to stercoral colitis.   -Continue ceftriaxone 2g daily (day 3). Switch to Cefazolin tomorrow. Continue for 5 days if no further infectious signs     Esophageal SCC EGD showed a circumferential, near obstructing, ulcerated mass in the midesophagus 22 cm from the incisors and extends 4-5 cm. CT head, abdomen and pelvis shows no metastatic findings. 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 Oncology Dr. Marin Olp thinks that patient is not a candidate for intervention at the moment given his nutritional status. Planned to start treatment when improved nutritional and functional status.    - Dilaudid 0.5 mg IV PRN Q3h for pain. Added  Hydrocodone-Acetaminophen 7.5 - 325 mg/47ml Q8H PRN.  - GI recommend EUS outpatient procedure. Plan 08/08/20. - Palliative team recommended transition to home with PT/OT and outpatient palliative care. Home hospice can be considered if patient is not doing better with above plan.      Malnutrition  Due to dysphagia and low solute intake. Pre-albumin of 13.8. G-tube placed, nutrition held for ileus, now resumed Osmolite with goal rate of 50 ml/h. Will try to consolidate to bolus feed for patient convenience at home if he tolerates this well. His wife who was a nurse will help him at home.    - Advance tube feeds as tolerated to goal 50 cc/h - daily Mag, Phos and Potassium. - Continue hydration with LR 125 cc/h - Give D50 if CBG drop lower than 70.      Normocytic anemia Iron studies revealed ferritin 131, TIBC normal at 272, iron saturation 5, iron 13. Vitamin T62, folic acid unremarkable. Combination of iron deficiency anemia and inflammation, secondary to ulcerated esophageal mass.  - Feraheme 510 mg IV given 06/25/20 - Continue CBC everyday.  - Lovenox 30 mg for DVT prophylaxis. The benefit of DVT prophylaxis outweighs the bleeding risk.  - Transfuse if Hgb < 7      Hypercalcemia Inappropriately Normal PTH. Normal vitamin D level. Elevated ionized calcium level  - Pending PTHr peptide level - Zometa 4 mg given once. Calcium 7.9    Paroxysmal a-fib  - Heart rate within normal limit - Continue Metoprolol and Dronedarone through tube - Holding anticoagulation for now, on lovenox for DVT ppx, will resume AC when safe     Hyponatremia- resolved - hypotonic, euvolemic hyponatremia.  Likely chronic and multifactorial with limited solute intake, but mildly elevated urine osmolality of 212 suggests some degree of inappropriate ADH activity. -  Encourage patient to intake more protein with Ensure and Pro Stat. G-tube will be helpful for additional nutrition. - Continue to monitor       Diet: Tube feeds Fluid: LR DVT: Lovenox 30 mg  Code: DNR   Prior to Admission Living Arrangement: home Anticipated Discharge Location: home health PT Barriers to Discharge: G-tube, refeeding syndrome monitor Dispo: Anticipated discharge in approximately 3-4 day(s).   [name] Gerold, DO 07/01/2020, 6:22 AM Pager: [phone] After 5pm on weekdays and 1pm on weekends: On Call pager [phone]

## 2020-07-01 NOTE — Progress Notes (Signed)
This RN was called to patient bedside by nurse tech. Patient left elbow slightly edematous and warm to touch, patient states it was painful when nurse tech tried to get a blood pressure on that arm. Potassium Phosphate 30 mmol in Dextrose 5% 500 mL infusion running at 85 ml/hr through left wrist PIV. Per verbal order from MD at bedside, stop medication running through left wrist PIV- infusion was stopped. Will update primary RN.

## 2020-07-01 NOTE — Consult Note (Signed)
   [facility] CM Inpatient Consult   07/01/2020  Daniel [name] 04-19-1941 [id number]   [facility] [ACO] Patient:   Patient screened for length of stay hospitalizations to check if potential [place] [facility] service needs.  Review of patient's medical record reveals patient has been seen by Palliative Care.  Primary Care Provider is [name], MD [facility] this provider is listed to provide the transition of care [TOC] for post hospital follow up.   Plan: Patient discussed in progression meeting and will follow progress with inpatient [facility] team for post hospital care.  For questions contact:   Natividad Brood, RN BSN [facility] Liaison  [phone] business mobile phone Toll free office [phone]  Fax number: [phone] [email or web address]

## 2020-07-02 LAB — CBC
HCT: 25.9 % — ABNORMAL LOW (ref 39.0–52.0)
Hemoglobin: 8.7 g/dL — ABNORMAL LOW (ref 13.0–17.0)
MCH: 32.5 pg (ref 26.0–34.0)
MCHC: 33.6 g/dL (ref 30.0–36.0)
MCV: 96.6 fL (ref 80.0–100.0)
Platelets: 216 10*3/uL (ref 150–400)
RBC: 2.68 MIL/uL — ABNORMAL LOW (ref 4.22–5.81)
RDW: 12.8 % (ref 11.5–15.5)
WBC: 10.5 10*3/uL (ref 4.0–10.5)
nRBC: 0 % (ref 0.0–0.2)

## 2020-07-02 LAB — PHOSPHORUS: Phosphorus: 1.6 mg/dL — ABNORMAL LOW (ref 2.5–4.6)

## 2020-07-02 LAB — CULTURE, BLOOD (ROUTINE X 2)
Culture: NO GROWTH
Special Requests: ADEQUATE

## 2020-07-02 LAB — BASIC METABOLIC PANEL
Anion gap: 9 (ref 5–15)
BUN: 8 mg/dL (ref 8–23)
CO2: 18 mmol/L — ABNORMAL LOW (ref 22–32)
Calcium: 7.5 mg/dL — ABNORMAL LOW (ref 8.9–10.3)
Chloride: 106 mmol/L (ref 98–111)
Creatinine, Ser: 0.82 mg/dL (ref 0.61–1.24)
GFR calc Af Amer: >60 mL/min (ref >60)
GFR calc non Af Amer: >60 mL/min (ref >60)
Glucose, Bld: 108 mg/dL — ABNORMAL HIGH (ref 70–99)
Potassium: 3.6 mmol/L (ref 3.5–5.1)
Sodium: 133 mmol/L — ABNORMAL LOW (ref 135–145)

## 2020-07-02 LAB — GLUCOSE, CAPILLARY
Glucose-Capillary: 110 mg/dL — ABNORMAL HIGH (ref 70–99)
Glucose-Capillary: 114 mg/dL — ABNORMAL HIGH (ref 70–99)
Glucose-Capillary: 90 mg/dL (ref 70–99)
Glucose-Capillary: 120 mg/dL — ABNORMAL HIGH (ref 70–99)
Glucose-Capillary: 92 mg/dL (ref 70–99)

## 2020-07-02 LAB — MAGNESIUM: Magnesium: 1.9 mg/dL (ref 1.7–2.4)

## 2020-07-02 MED ORDER — HYDROCODONE-ACETAMINOPHEN 7.5-325 MG/15ML PO SOLN: 15.0000 mL | Freq: Four times a day (QID) | ORAL | Status: DC | PRN

## 2020-07-02 MED ORDER — K PHOS MONO-SOD PHOS DI & MONO 155-852-130 MG PO TABS
1000.0000 mg | ORAL_TABLET | Freq: Four times a day (QID) | ORAL | Status: DC
  Administered 2020-07-02 – 2020-07-03 (×6): 1000 mg via ORAL
  Filled 2020-07-02 (×8): qty 4

## 2020-07-02 MED ORDER — SENNOSIDES 8.8 MG/5ML PO SYRP
5.0000 mL | ORAL_SOLUTION | Freq: Every day | ORAL | Status: DC | PRN
  Filled 2020-07-02: qty 5

## 2020-07-02 MED ORDER — OXYCODONE-ACETAMINOPHEN 7.5-325 MG PO TABS
1.0000 | ORAL_TABLET | ORAL | Status: DC | PRN
  Administered 2020-07-02 – 2020-07-08 (×20): 1 via ORAL
  Filled 2020-07-02 (×21): qty 1

## 2020-07-02 NOTE — Progress Notes (Signed)
PT Cancellation Note  Patient Details Name: Daniel [name] MRN: [id number] DOB: [birth year]   Cancelled Treatment:    Reason Eval/Treat Not Completed: Other (comment) (Pt refused due to loose stools.  Nurse confirms refusal. )   [name] 07/02/2020, 11:34 AM [name],PT Acute Rehabilitation Services Pager:  [phone]  Office:  [phone]

## 2020-07-02 NOTE — Progress Notes (Signed)
Subjective:   Hospital day: 11  Overnight event: none  Patient examined at bedside. Patient complains of pain "all over" since last night which improved with Hycet this morning.  He states that he continues to have frequent bowel movements and requested that we decrease his stool regimen.  It seems that he was in distress yesterday evening due to his pain and there were instances during our encounter this morning where he had requested that he be made comfortable instead of suffering.  We made him aware that we would make all the necessary medication arrangements to keep his pain under control.   Objective:  Vital signs in last 24 hours: Vitals:   07/01/20 2023 07/01/20 2320 07/02/20 0500 07/02/20 0504  BP: 118/74 (!) 114/59  130/60  Pulse: 78 77  90  Resp: 18 18  20   Temp: 98.1 F (36.7 C) 97.9 F (36.6 C)  99.4 F (37.4 C)  TempSrc: Oral Oral  Oral  SpO2: 93% 93%  (!) 89%  Weight:   54.2 kg   Height:        Physical Exam  Constitutional: Cachectic, in no apparent distress Abdomen: G-tube located at the left side of his abdomen, dressed in bandage without signs of leakage  Assessment/Plan: Daniel [name] a 79 year old man with a history of PUD, tobacco use, chronic A. fib, PAD, and HTN admitted for hyponatremia incidentally discovered in anticipation of EGD for odynophagia.EGD showed a mid-esophageal massconsistent with poorly differentiated SCC.Goal is to advance tube feed and go home with home health PT and outpatient palliative care.  Principal Problem:   Hyponatremia Active Problems:   Atrial fibrillation status post cardioversion (HCC)   Lung nodule   Protein-calorie malnutrition, severe (HCC)   Odynophagia   Goals of care, counseling/discussion   Esophageal cancer, stage IIB (HCC)   Pressure injury of skin   Palliative care by specialist   DNR (do not resuscitate)   Bacteremia due to Klebsiella pneumoniae   Esophageal SCC EGD showed  acircumferential, near obstructing, ulcerated mass in the midesophagus 22 cm from the incisors and extends 4-5 cm. CT head, abdomen and pelvis shows no metastatic findings. 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 Domingo [name] that patient is not a candidate for intervention at the moment given his nutritional status.Planned to start treatment when improved nutritional and functional status.  -Pain regimen: Dilaudid0.5mg  IVPRNQ3hfor pain, Percocet 7.5 mg q4hrs prn  - GI recommend EUS outpatient procedure. Plan 08/08/20. -Palliative team recommended transition to home with PT/OT and outpatient palliative care. Home hospice can be consideredif patient is not doing better with above plan.    Refeeding syndrome -Continue tube feeds Goal at 50 ml/h.  - Continue K-Phos 1000 mg 4 times daily -Continue thiamine 100 mg daily - Continue on telemetry   Stool impaction-resolved Stercoral colitis -Titrate down MiraLAX and Senokot   Klebsiella bacteremia Blood culturefrom 7/8grew Klebsiella. This is likely frombacterial translocationsecondary to stercoral colitis.   -Continue ceftriaxone 2g daily (day3) >> Ancef (day 1/2) to complete a total 5-day course of antibiotics   Malnutrition Due to dysphagia and low solute intake.Pre-albumin of 13.8.G-tube placed, nutrition held for ileus, now resumed Osmolitewith goal rate of 50 ml/h. Will try to consolidate to bolus feed for patient convenience at home if he tolerates this well.His wife who was a nurse will help him at home.  -Advance tube feeds as tolerated to goal 50 cc/h - daily Mag, Phos and Potassium. - Continue hydration with DZ329 cc/h - Give D50  if CBG drop lower than 70.   Normocytic anemia Iron studies revealed ferritin 131, TIBC normal at 272, iron saturation 5, iron 13.Vitamin M73, folic acid unremarkable. Combination of iron deficiency anemia and inflammation, secondary to  ulcerated esophageal mass.  - Feraheme 510 mg IVgiven7/6/21 - Continue CBC everyday.  - Transfuse if Hgb < 7   Hypercalcemia InappropriatelyNormal PTH.Normalvitamin D level.Elevated ionized calcium level.  PTH-rp unremarkable -Zometa 4 mggivenonce.Calcium 7.9   Paroxysmal a-fib  - Heart rate within normal limit - Continue Metoprololand Dronedarone - Resume Eliquis 5 mg twice daily   Hyponatremia- resolved -Continue Ensure and Pro Stat -Continue to monitor   Diet:Tube feeds Fluid: LR UYZ:[id number] Code: DNR  Prior to Admission Living Arrangement:home Anticipated Discharge Location:homehealth PT Barriers to Discharge:G-tube, refeeding syndrome monitor Dispo: Anticipated discharge in approximately3-4day(s).  [name] Gerold, DO 07/02/2020, 7:46 AM Pager: [phone] After 5pm on weekdays and 1pm on weekends: On Call pager [phone]

## 2020-07-03 ENCOUNTER — Inpatient Hospital Stay (HOSPITAL_COMMUNITY): Payer: Medicare Other

## 2020-07-03 LAB — GLUCOSE, CAPILLARY
Glucose-Capillary: 100 mg/dL — ABNORMAL HIGH (ref 70–99)
Glucose-Capillary: 110 mg/dL — ABNORMAL HIGH (ref 70–99)
Glucose-Capillary: 119 mg/dL — ABNORMAL HIGH (ref 70–99)
Glucose-Capillary: 122 mg/dL — ABNORMAL HIGH (ref 70–99)
Glucose-Capillary: 96 mg/dL (ref 70–99)
Glucose-Capillary: 99 mg/dL (ref 70–99)

## 2020-07-03 LAB — BASIC METABOLIC PANEL
Anion gap: 6 (ref 5–15)
BUN: 9 mg/dL (ref 8–23)
CO2: 22 mmol/L (ref 22–32)
Calcium: 6.7 mg/dL — ABNORMAL LOW (ref 8.9–10.3)
Chloride: 106 mmol/L (ref 98–111)
Creatinine, Ser: 0.83 mg/dL (ref 0.61–1.24)
GFR calc Af Amer: >60 mL/min (ref >60)
GFR calc non Af Amer: >60 mL/min (ref >60)
Glucose, Bld: 117 mg/dL — ABNORMAL HIGH (ref 70–99)
Potassium: 3.7 mmol/L (ref 3.5–5.1)
Sodium: 134 mmol/L — ABNORMAL LOW (ref 135–145)

## 2020-07-03 LAB — MAGNESIUM: Magnesium: 1.8 mg/dL (ref 1.7–2.4)

## 2020-07-03 LAB — CBC
HCT: 23.5 % — ABNORMAL LOW (ref 39.0–52.0)
Hemoglobin: 8 g/dL — ABNORMAL LOW (ref 13.0–17.0)
MCH: 33.2 pg (ref 26.0–34.0)
MCHC: 34 g/dL (ref 30.0–36.0)
MCV: 97.5 fL (ref 80.0–100.0)
Platelets: 257 10*3/uL (ref 150–400)
RBC: 2.41 MIL/uL — ABNORMAL LOW (ref 4.22–5.81)
RDW: 13.5 % (ref 11.5–15.5)
WBC: 9.7 10*3/uL (ref 4.0–10.5)
nRBC: 0 % (ref 0.0–0.2)

## 2020-07-03 LAB — ALBUMIN: Albumin: 1.5 g/dL — ABNORMAL LOW (ref 3.5–5.0)

## 2020-07-03 LAB — PHOSPHORUS: Phosphorus: 3.6 mg/dL (ref 2.5–4.6)

## 2020-07-03 MED ORDER — CEFAZOLIN SODIUM-DEXTROSE 2-4 GM/100ML-% IV SOLN
2.0000 g | Freq: Three times a day (TID) | INTRAVENOUS | Status: AC
  Administered 2020-07-03 (×2): 2 g via INTRAVENOUS
  Filled 2020-07-03 (×2): qty 100

## 2020-07-03 MED ORDER — DOCUSATE SODIUM 50 MG/5ML PO LIQD
50.0000 mg | Freq: Every day | ORAL | Status: DC | PRN
  Filled 2020-07-03: qty 10

## 2020-07-03 MED ORDER — POLYETHYLENE GLYCOL 3350 17 G PO PACK: 17.0000 g | PACK | Freq: Every day | ORAL | Status: DC | PRN

## 2020-07-03 MED ORDER — FUROSEMIDE 10 MG/ML IJ SOLN
20.0000 mg | Freq: Once | INTRAMUSCULAR | Status: AC
  Administered 2020-07-03: 20 mg via INTRAVENOUS
  Filled 2020-07-03: qty 2

## 2020-07-03 NOTE — Care Management Important Message (Signed)
Important Message  Patient Details  Name: Daniel [name] MRN: [id number] Date of Birth: June 13, 1941   Medicare Important Message Given:  Yes     Shelda Altes 07/03/2020, 10:36 AM

## 2020-07-03 NOTE — Progress Notes (Signed)
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 He has been having progressive odynophagia and dysphagia to solids and referred to GI for EGD with possible dilation. EGD showed esophageal ca and will be getting feeding tube placed today.  New onset of ileus.     PT Comments    Pt admitted with above diagnosis. Pt was able to get onto 3N1 from the bed with min assist squat pivot as he is still having loose stools. Pt so fatigued it took him awhile to get his breathe once finished and back in bed even on O2. Pt with poor endurance for activity.  Refused to do more than transfer to 3n1 and back to bed.  Pt met 0/3 goals due to decline due to bowel issues as well as respiratory issues.  Will revise goals and continue as able. Pt currently with functional limitations due to balance and endurance deficits. Pt will benefit from skilled PT to increase their independence and safety with mobility to allow discharge to the venue listed below.     Follow Up Recommendations  Home health PT;Supervision/Assistance - 24 hour     Equipment Recommendations  Rolling walker with 5" wheels;3in1 (PT);Wheelchair (measurements PT);Wheelchair cushion (measurements PT);Hospital bed    Recommendations for Other Services       Precautions / Restrictions Precautions Precautions: Fall Restrictions Weight Bearing Restrictions: No    Mobility  Bed Mobility Overal bed mobility: Needs Assistance Bed Mobility: Supine to Sit     Supine to sit: Mod assist;Min assist;HOB elevated     General bed mobility comments: Needed assist due to  weakness.   Transfers Overall transfer level: Needs assistance Equipment used: None Transfers: Sit to/from [employer] Sit to Stand: Min assist   Squat pivot transfers: Min assist     General transfer comment: patient slightly unsteady with initial stand partially and needed assist for squat pivot to 3N1 as pt still having loose stools.  Pt had BM on floor prior to getting onto 3N1  and PT cleaned pt and bed.    Ambulation/Gait             General Gait Details: declined   Marine scientist [place] (Stroke Patients Only)       Balance Overall balance assessment: Needs assistance Sitting-balance support: Feet supported;No upper extremity supported Sitting balance-Leahy Scale: Good     Standing balance support: During functional activity;Bilateral upper extremity supported Standing balance-Leahy Scale: Poor Standing balance comment: at increased fall risk, needed bil UE suport and external stedying.                             Cognition Arousal/Alertness: Awake/alert Behavior During Therapy: WFL for tasks assessed/performed Overall Cognitive Status: Within Functional Limits for tasks assessed                                        Exercises  General Comments        Pertinent Vitals/Pain Pain Assessment: No/denies pain    Home Living                      Prior Function            PT Goals (current goals can now be found in the care plan section) Acute Rehab PT Goals Patient Stated Goal: to leave and go back home PT Goal Formulation: With patient Time For Goal Achievement: 07/17/20 Potential to Achieve Goals: Good Progress towards PT goals: Not progressing toward goals - comment (limited by loose stools)    Frequency    Min 3X/week      PT Plan Current plan remains appropriate    Co-evaluation              AM-PAC PT "6 Clicks" Mobility    Outcome Measure  Help needed turning from your back to your side while in a flat bed without using bedrails?: None Help needed moving from lying on your back to sitting on the side of a flat bed without using bedrails?: None Help needed moving to and from a bed to a chair (including a wheelchair)?: A Little Help needed standing up from a chair using your arms (e.g., wheelchair or bedside chair)?: A Little Help needed to walk in hospital room?: A Little Help needed climbing 3-5 steps with a railing? : A Little 6 Click Score: 20    End of Session Equipment Utilized During Treatment: Gait belt;Oxygen Activity Tolerance: Patient limited by fatigue Patient left: with call bell/phone within reach;in bed;with bed alarm set Nurse Communication: Mobility status PT Visit Diagnosis: Muscle weakness (generalized) (M62.81);Unsteadiness on feet (R26.81)     Time: 3496-1164 PT Time Calculation (min) (ACUTE ONLY): 14 min  Charges:  $Therapeutic Activity: 8-22 mins                     Urijah Arko W,PT Acute Rehabilitation Services Pager:  [phone]  Office:  [place] 07/03/2020, 1:33 PM

## 2020-07-03 NOTE — Progress Notes (Signed)
Subjective:   Hospital day: 11  Overnight event: No  Patient examined at bedside this morning. He feels well, noted that he slept well after receiving pain medications. Diarrhea improved, had 2 BMs yesterday. Patient states that he only drinks the "original" flavor Ensures.  Objective:  Vital signs in last 24 hours: Vitals:   07/02/20 1202 07/02/20 1933 07/03/20 0207 07/03/20 0447  BP: (!) 145/68 117/62    Pulse: 86 (!) 102    Resp: 20 20  18   Temp: 98.5 F (36.9 C) 99 F (37.2 C)    TempSrc: Oral Oral    SpO2: 92% (!) 89%    Weight:   54.7 kg   Height:        Physical Exam  Physical Exam Constitutional:      General: He is not in acute distress.    Appearance: He is not toxic-appearing.  HENT:     Head: Normocephalic.  Eyes:     General: No scleral icterus.    Conjunctiva/sclera: Conjunctivae normal.  Cardiovascular:     Rate and Rhythm: Normal rate and regular rhythm.     Heart sounds: Normal heart sounds.  Pulmonary:     Effort: Pulmonary effort is normal. No respiratory distress.     Breath sounds: Normal breath sounds.  Abdominal:     General: Bowel sounds are normal. There is no distension.  Musculoskeletal:     Right lower leg: No edema.     Left lower leg: No edema.  Skin:    General: Skin is warm.     Coloration: Skin is not jaundiced.  Neurological:     Mental Status: He is alert.  Psychiatric:        Mood and Affect: Mood normal.     Assessment/Plan:  Daniel [name] a 79 year old man with a history of PUD, tobacco use, chronic A. fib, PAD, and HTN admitted for hyponatremia incidentally discovered in anticipation of EGD for odynophagia.EGD showed a mid-esophageal massconsistent with poorly differentiated SCC.Goal is to advance tube feed and go home with home health PT and outpatient palliative care.   Principal Problem:   Hyponatremia Active Problems:   Atrial fibrillation status post cardioversion (HCC)   Lung nodule    Protein-calorie malnutrition, severe (HCC)   Odynophagia   Goals of care, counseling/discussion   Esophageal cancer, stage IIB (HCC)   Pressure injury of skin   Palliative care by specialist   DNR (do not resuscitate)   Bacteremia due to Klebsiella pneumoniae  Refeeding syndrome Hypophosphatemia, tremor, dyspnea, tachycardia could be related to refeeding syndrome.   -Resume tube feeds at a lower rate. Osmolite start at 20 ml/h, increase by 10 ml/h every 10 h. Goal at 50 ml/h.  - Patient has been having good BM - Continue to monitor for K, phos, and Mgdaily.  -Continue thiamine 100 mg daily - Continue on telemetry - Dietician consulted. Will try to consolidate his feed into boluses for convenience when patient goes home. Will do this slowly as patient tolerates.  - Will also work together with family members to have a plan of how to take care of his gastric tube and giving nutrition through tube feed. His wife who was a nurse will be helping him at home. Patient will be going home with home health.    Stool impaction Stercoral colitis He is now having regular bowel movements and had 2 bowel movements yesterday.   -Will continue to hold bowel regimen of bisacodyl, Senokot and MiraLAX  Klebsiella bacteremia Blood culturefrom 7/8grew Klebsiella. This is likely frombacterial translocationsecondary to stercoral colitis.   - Last dose of Cefazolin today. Total of 5 days   Esophageal SCC EGD showed acircumferential, near obstructing, ulcerated mass in the midesophagus 22 cm from the incisors and extends 4-5 cm. CT head, abdomen and pelvis shows no metastatic findings. 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 Domingo [name] that patient is not a candidate for intervention at the moment given his nutritional status.Planned to start treatment when improved nutritional and functional status.  -Dilaudid0.5mg  IVPRNQ3hfor pain.Added  Percocet 7.5 - 325 mg/23ml Q8H PRN. His pain is under controlled. Goal is to keep patient comfortable.  - GI recommend EUS outpatient procedure. Plan 08/08/20. -Palliative team recommended transition to home with PT/OT and outpatient palliative care. Home hospice can be consideredif patient is not doing better with above plan.    Malnutrition Due to dysphagia and low solute intake.Pre-albumin of 13.8.G-tube placed, nutrition held for ileus, now resumed Osmolitewith goal rate of 50 ml/h. Will try to consolidate to bolus feed for patient convenience at home if he tolerates this well.His wife who was a nurse will help him at home.  -Advance tube feeds as tolerated to goal 50 cc/h - daily Mag, Phos and Potassium. - Continue hydration with MA263 cc/h - Give D50 if CBG drop lower than 70.   Normocytic anemia Iron studies revealed ferritin 131, TIBC normal at 272, iron saturation 5, iron 13.Vitamin F35, folic acid unremarkable. Combination of iron deficiency anemia and inflammation, secondary to ulcerated esophageal mass.  - Feraheme 510 mg IVgiven7/6/21 - Continue CBC everyday.  - Continue Eliquis for a-fib and DVT prophylaxis. The benefit of DVT prophylaxis outweighs the bleeding risk.  - Transfuse if Hgb < 7   Paroxysmal a-fib  - Heart rate within normal limit - Continue Metoprololand Dronedaronethrough tube -Continue Eliquis   Hypercalcemia-resolved InappropriatelyNormal PTH.Normalvitamin D level.Elevated ionized calcium level  - Pending PTHr peptide level -Zometa 4 mggivenonce.   Hyponatremia- resolved -hypotonic, euvolemic hyponatremia. Likely chronic and multifactorial with limited solute intake, but mildly elevated urine osmolality of 212 suggests some degree of inappropriate ADH activity. - Encourage patient to intakemore proteinwith Ensure and Pro Stat. G-tube will be helpful for additionalnutrition. -Continue to monitor    Diet:Tube feeds Fluid: LR DVT: Eliquis Code: DNR  Prior to Admission Living Arrangement:home Anticipated Discharge Location:homehealth PT Barriers to Discharge:G-tube, refeeding syndrome monitor Dispo: Anticipated discharge in approximately3-4day(s).   [name] Gerold, DO 07/03/2020, 9:36 AM Pager: [phone] After 5pm on weekdays and 1pm on weekends: On Call pager [phone]

## 2020-07-04 ENCOUNTER — Inpatient Hospital Stay (HOSPITAL_COMMUNITY): Payer: Medicare Other

## 2020-07-04 DIAGNOSIS — I340 Nonrheumatic mitral (valve) insufficiency: Secondary | ICD-10-CM

## 2020-07-04 DIAGNOSIS — I361 Nonrheumatic tricuspid (valve) insufficiency: Secondary | ICD-10-CM

## 2020-07-04 LAB — GLUCOSE, CAPILLARY
Glucose-Capillary: 98 mg/dL (ref 70–99)
Glucose-Capillary: 85 mg/dL (ref 70–99)
Glucose-Capillary: 133 mg/dL — ABNORMAL HIGH (ref 70–99)
Glucose-Capillary: 166 mg/dL — ABNORMAL HIGH (ref 70–99)
Glucose-Capillary: 130 mg/dL — ABNORMAL HIGH (ref 70–99)

## 2020-07-04 LAB — CBC
HCT: 25.1 % — ABNORMAL LOW (ref 39.0–52.0)
Hemoglobin: 8.4 g/dL — ABNORMAL LOW (ref 13.0–17.0)
MCH: 33.3 pg (ref 26.0–34.0)
MCHC: 33.5 g/dL (ref 30.0–36.0)
MCV: 99.6 fL (ref 80.0–100.0)
Platelets: 295 10*3/uL (ref 150–400)
RBC: 2.52 MIL/uL — ABNORMAL LOW (ref 4.22–5.81)
RDW: 14.2 % (ref 11.5–15.5)
WBC: 10.6 10*3/uL — ABNORMAL HIGH (ref 4.0–10.5)
nRBC: 0 % (ref 0.0–0.2)

## 2020-07-04 LAB — BASIC METABOLIC PANEL
Anion gap: 8 (ref 5–15)
BUN: 11 mg/dL (ref 8–23)
CO2: 20 mmol/L — ABNORMAL LOW (ref 22–32)
Calcium: 6.8 mg/dL — ABNORMAL LOW (ref 8.9–10.3)
Chloride: 108 mmol/L (ref 98–111)
Creatinine, Ser: 0.99 mg/dL (ref 0.61–1.24)
GFR calc Af Amer: >60 mL/min (ref >60)
GFR calc non Af Amer: >60 mL/min (ref >60)
Glucose, Bld: 94 mg/dL (ref 70–99)
Potassium: 4.6 mmol/L (ref 3.5–5.1)
Sodium: 136 mmol/L (ref 135–145)

## 2020-07-04 LAB — MAGNESIUM: Magnesium: 1.8 mg/dL (ref 1.7–2.4)

## 2020-07-04 LAB — ECHOCARDIOGRAM COMPLETE
Area-P 1/2: 2.99 cm2
S' Lateral: 3 cm

## 2020-07-04 LAB — PHOSPHORUS: Phosphorus: 2.9 mg/dL (ref 2.5–4.6)

## 2020-07-04 MED ORDER — OSMOLITE 1.5 CAL PO LIQD
237.0000 mL | Freq: Every day | ORAL | Status: DC
  Administered 2020-07-04 – 2020-07-09 (×24): 237 mL
  Filled 2020-07-04 (×26): qty 237

## 2020-07-04 MED ORDER — FUROSEMIDE 10 MG/ML IJ SOLN
20.0000 mg | Freq: Once | INTRAMUSCULAR | Status: AC
  Administered 2020-07-04: 20 mg via INTRAVENOUS
  Filled 2020-07-04: qty 2

## 2020-07-04 MED ORDER — PROSOURCE TF PO LIQD
45.0000 mL | Freq: Two times a day (BID) | ORAL | Status: DC
  Administered 2020-07-04 – 2020-07-09 (×10): 45 mL
  Filled 2020-07-04 (×10): qty 45

## 2020-07-04 NOTE — Progress Notes (Addendum)
Nutrition Follow-up  DOCUMENTATION CODES:   Severe malnutrition in context of chronic illness, Underweight  INTERVENTION:   Transition to bolus feedings:  -1 can/ARC of Osmolite 1.5 five times daily  -45 ml ProSource or other protein modular BID -100 ml free water flushes before and after each feeding  Provides: 1855 kcals, 97 grams protein, 905 ml free water. Meets 100% of needs (1905 ml with flushes).   NUTRITION DIAGNOSIS:   Severe Malnutrition related to chronic illness as evidenced by severe fat depletion, severe muscle depletion   Ongoing  GOAL:   Patient will meet greater than or equal to 90% of their needs   Met via TF  MONITOR:   TF tolerance, PO intake, Supplement acceptance, Weight trends, Labs, I & O's, Skin  REASON FOR ASSESSMENT:   Consult Assessment of nutrition requirement/status  ASSESSMENT:   79 year old man with a history of PUD, tobacco use, chronic A. fib, PAD, and HTN admitted for hyponatremia with plans for EGD for odynophagia. EGD showed acircumferential, near obstructing, ulcerated mass in the midesophagus.  7/6 G-tube placed 7/8 KUB shows dilated bowels which could be ileus, stool impaction  Pt tolerating Osmolite 1.5 at goal rate. Having increased loose stools. Transition to bolus feedings prior to discharge. Per RN, pt not eating much due to ongoing pain after meals. Barely consumes water. Will need to add free water flushes to meet fluid requirement.   Admission weight: 42.6  Current weight: 52.9 kg   Medications: thiamine Labs: CBG 85-133  Diet Order:   Diet Order            DIET SOFT Room service appropriate? Yes; Fluid consistency: Thin  Diet effective now                 EDUCATION NEEDS:   Not appropriate for education at this time  Skin:  Skin Assessment: Skin Integrity Issues: Skin Integrity Issues:: Stage II, Other (Comment) Stage II: buttocks x2 Other: MASD- buttocks  Last BM:  7/13  Height:   Ht Readings  from Last 1 Encounters:  06/21/20 5' 9"  (1.753 m)    Weight:   Wt Readings from Last 1 Encounters:  07/04/20 52.9 kg    Ideal Body Weight:  72.7 kg  BMI:  Body mass index is 17.22 kg/m.  Estimated Nutritional Needs:   Kcal:  1102-1117  Protein:  85-95 grams  Fluid:  >/= 1.7 L/day  Mariana Single RD, LDN Clinical Nutrition Pager listed in [place]

## 2020-07-04 NOTE — Progress Notes (Addendum)
Subjective:   Hospital day: 12  Overnight event: No  Patient examined at bedside. Patient's mood appears to be down but he denies any specific complaints, states that he slept well. Patient refuses to talk about what is bothering him. He report 1 loose BM yesterday. Frequent urination reported.  Objective:  Vital signs in last 24 hours: Vitals:   07/03/20 2027 07/04/20 0325 07/04/20 0339 07/04/20 0354  BP: (!) 125/59 127/68  (!) 126/58  Pulse: 97 83  79  Resp: 18 20  20   Temp: 97.8 F (36.6 C) 98.2 F (36.8 C)  98.9 F (37.2 C)  TempSrc:  Oral  Oral  SpO2: (!) 89% 91%  98%  Weight:   52.9 kg   Height:        Physical Exam  Physical Exam Constitutional:      General: He is not in acute distress. HENT:     Head: Normocephalic.  Eyes:     General: No scleral icterus.    Conjunctiva/sclera: Conjunctivae normal.  Cardiovascular:     Rate and Rhythm: Normal rate and regular rhythm.     Heart sounds: Normal heart sounds.  Pulmonary:     Effort: Pulmonary effort is normal. No respiratory distress.  Abdominal:     General: Bowel sounds are normal. There is no distension.  Musculoskeletal:     Right lower leg: Edema (+1 ankle edema) present.     Left lower leg: Edema (+1 ankle edema) present.  Skin:    General: Skin is warm.     Coloration: Skin is not jaundiced.  Neurological:     Mental Status: He is alert.  Psychiatric:     Comments: Depressed mood     Assessment/Plan: Daniel [name] is a 79 year old man with a history of PUD, tobacco use, chronic A. fib, PAD, and HTN admitted for hyponatremia incidentally discovered in anticipation of EGD for odynophagia. 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 Goal is to advance tube feed and go home with home health PT and outpatient palliative care.   Principal Problem:   Hyponatremia Active Problems:   Atrial fibrillation status post cardioversion (HCC)   Lung nodule    Protein-calorie malnutrition, severe (HCC)   Odynophagia   Goals of care, counseling/discussion   Esophageal cancer, stage IIB (HCC)   Pressure injury of skin   Palliative care by specialist   DNR (do not resuscitate)   Bacteremia due to Klebsiella pneumoniae  Refeeding syndrome, malnutrition Malnutrition due to dysphagia and low solute intake. Pre-albumin of 13.8. G-tube placed, nutrition held for ileus, now resumed. Will try to consolidate to bolus feed for patient convenience at home if he tolerates this well. His wife who was a nurse will help him at home.    - Dietician consulted. Consolidate tube feed into boluses:  -1 can/ARC of Osmolite 1.5 237 mL five times daily with 45 ml ProSource or other protein modular BID. 100 ml free water flushes before and after each feeding - Patient has been having good BM - Continue to monitor for K, phos, and Mg daily.  - Continue thiamine 100 mg daily - PPI prophylaxis - Continue on telemetry  - Will also work together with family members to have a plan of how to take care of his gastric tube and giving nutrition through tube feed. His wife who was a nurse will be helping him at home. Patient will be going home with home health.     CHF exacerbation  Patient has history of tachycardia-mediated cardiomyopathy which he was seeing [facility] in 2018. Echo 05/31/2017 shows EF 20% when he was having a-fib with RVR. Repeat Echo 9/12 shows normal EF 55-60%. Patient has +1 ankle edema bilaterally on exam. CXR shows pulmonary edema and bilateral pleural effusions, which consistent with CHF. He was on LR 125 cc/h which was stopped. Patient have good output 2800 cc of urine last night with 20 mg Lasix.   - Stop IVF  - Give one more dose of Lasix 20 mg today. Reassess fluid status tomorrow  - Monitor I/O and daily weight.  - Pending TTE   Stool impaction Stercoral colitis He is now having regular bowel movements    - Will continue to hold bowel regimen  of bisacodyl, Senokot and MiraLAX        Esophageal SCC EGD showed a circumferential, near obstructing, ulcerated mass in the midesophagus 22 cm from the incisors and extends 4-5 cm. CT head, abdomen and pelvis shows no metastatic findings. 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 Oncology Dr. Marin Olp thinks that patient is not a candidate for intervention at the moment given his nutritional status. Planned to start treatment when improved nutritional and functional status.    - Dilaudid 0.5 mg IV PRN Q3h for pain. Added Percocet 7.5 - 325 mg/50ml Q8H PRN. His pain is under controlled. Goal is to keep patient comfortable.  - GI recommend EUS outpatient procedure. Plan 08/08/20. - Palliative team recommended transition to home with PT/OT and outpatient palliative care. Home hospice can be considered if patient is not doing better with above plan.      Normocytic anemia Iron studies revealed ferritin 131, TIBC normal at 272, iron saturation 5, iron 13. Vitamin Y19, folic acid unremarkable. Combination of iron deficiency anemia and inflammation, secondary to ulcerated esophageal mass.   - Feraheme 510 mg IV given 06/25/20 - Continue CBC everyday.  - Continue Eliquis for a-fib and DVT prophylaxis. The benefit of DVT prophylaxis outweighs the bleeding risk.  - Transfuse if Hgb < 7     Paroxysmal a-fib  - Heart rate within normal limit - Continue Metoprolol and Dronedarone through tube - Continue Eliquis      Klebsiella bacteremia Blood culture from 7/8 grew Klebsiella. This is likely from bacterial translocation secondary to stercoral colitis.    - Finished 5 days of antibiotics   Hypercalcemia-resolved Inappropriately Normal PTH. Normal vitamin D level. Elevated ionized calcium level   - Pending PTHr peptide level - Zometa 4 mg given once.      Diet: Tube feeds Fluid: no DVT: Eliquis Code: DNR   Prior to Admission Living Arrangement: home Anticipated Discharge  Location: home health PT Barriers to Discharge: G-tube, refeeding syndrome monitor Dispo: Anticipated discharge in approximately 2 - 3 day(s).   [name] Gerold, DO 07/04/2020, 8:53 AM Pager: [phone] After 5pm on weekdays and 1pm on weekends: On Call pager [phone]

## 2020-07-04 NOTE — Progress Notes (Signed)
Echocardiogram 2D Echocardiogram has been performed.  Oneal Deputy Adante Courington 07/04/2020, 11:35 AM

## 2020-07-05 LAB — BASIC METABOLIC PANEL
Anion gap: 4 — ABNORMAL LOW (ref 5–15)
BUN: 17 mg/dL (ref 8–23)
CO2: 21 mmol/L — ABNORMAL LOW (ref 22–32)
Calcium: 7 mg/dL — ABNORMAL LOW (ref 8.9–10.3)
Chloride: 108 mmol/L (ref 98–111)
Creatinine, Ser: 0.93 mg/dL (ref 0.61–1.24)
GFR calc Af Amer: >60 mL/min (ref >60)
GFR calc non Af Amer: >60 mL/min (ref >60)
Glucose, Bld: 94 mg/dL (ref 70–99)
Potassium: 5.2 mmol/L — ABNORMAL HIGH (ref 3.5–5.1)
Sodium: 133 mmol/L — ABNORMAL LOW (ref 135–145)

## 2020-07-05 LAB — RENAL FUNCTION PANEL
Albumin: 1.6 g/dL — ABNORMAL LOW (ref 3.5–5.0)
Anion gap: 5 (ref 5–15)
BUN: 18 mg/dL (ref 8–23)
CO2: 22 mmol/L (ref 22–32)
Calcium: 7.3 mg/dL — ABNORMAL LOW (ref 8.9–10.3)
Chloride: 107 mmol/L (ref 98–111)
Creatinine, Ser: 1.04 mg/dL (ref 0.61–1.24)
GFR calc Af Amer: >60 mL/min (ref >60)
GFR calc non Af Amer: >60 mL/min (ref >60)
Glucose, Bld: 76 mg/dL (ref 70–99)
Phosphorus: 3.5 mg/dL (ref 2.5–4.6)
Potassium: 5.1 mmol/L (ref 3.5–5.1)
Sodium: 134 mmol/L — ABNORMAL LOW (ref 135–145)

## 2020-07-05 LAB — CBC
HCT: 24.8 % — ABNORMAL LOW (ref 39.0–52.0)
Hemoglobin: 8.3 g/dL — ABNORMAL LOW (ref 13.0–17.0)
MCH: 33.1 pg (ref 26.0–34.0)
MCHC: 33.5 g/dL (ref 30.0–36.0)
MCV: 98.8 fL (ref 80.0–100.0)
Platelets: 319 10*3/uL (ref 150–400)
RBC: 2.51 MIL/uL — ABNORMAL LOW (ref 4.22–5.81)
RDW: 14.3 % (ref 11.5–15.5)
WBC: 8.3 10*3/uL (ref 4.0–10.5)
nRBC: 0 % (ref 0.0–0.2)

## 2020-07-05 LAB — GLUCOSE, CAPILLARY
Glucose-Capillary: 114 mg/dL — ABNORMAL HIGH (ref 70–99)
Glucose-Capillary: 127 mg/dL — ABNORMAL HIGH (ref 70–99)
Glucose-Capillary: 140 mg/dL — ABNORMAL HIGH (ref 70–99)
Glucose-Capillary: 158 mg/dL — ABNORMAL HIGH (ref 70–99)
Glucose-Capillary: 81 mg/dL (ref 70–99)
Glucose-Capillary: 85 mg/dL (ref 70–99)

## 2020-07-05 LAB — MAGNESIUM: Magnesium: 1.9 mg/dL (ref 1.7–2.4)

## 2020-07-05 LAB — PHOSPHORUS: Phosphorus: 2 mg/dL — ABNORMAL LOW (ref 2.5–4.6)

## 2020-07-05 MED ORDER — FUROSEMIDE 10 MG/ML IJ SOLN
20.0000 mg | Freq: Once | INTRAMUSCULAR | Status: AC
  Administered 2020-07-05: 20 mg via INTRAVENOUS
  Filled 2020-07-05: qty 2

## 2020-07-05 MED ORDER — SODIUM CHLORIDE 0.9 % IV SOLN
510.0000 mg | Freq: Once | INTRAVENOUS | Status: AC
  Administered 2020-07-05: 510 mg via INTRAVENOUS
  Filled 2020-07-05: qty 17

## 2020-07-05 MED ORDER — SODIUM PHOSPHATES 45 MMOLE/15ML IV SOLN
30.0000 mmol | Freq: Once | INTRAVENOUS | Status: AC
  Administered 2020-07-05: 30 mmol via INTRAVENOUS
  Filled 2020-07-05: qty 10

## 2020-07-05 MED ORDER — K PHOS MONO-SOD PHOS DI & MONO 155-852-130 MG PO TABS: 500.0000 mg | ORAL_TABLET | Freq: Three times a day (TID) | ORAL | Status: DC

## 2020-07-05 NOTE — Plan of Care (Signed)
  Problem: Education: Goal: Knowledge of General Education information will improve Description Including pain rating scale, medication(s)/side effects and non-pharmacologic comfort measures Outcome: Progressing   

## 2020-07-05 NOTE — Progress Notes (Signed)
Subjective:   Hospital day: 13  Overnight event: No  Patient examined at bedside. Patient appears to be sad and states that he feels "okay". He seems like he does not want talk about what is bothering. H states that pain is conrolled with meds. Patient requested help during examine to use the bathroom. Expecting his son to visit this afternoon.   Objective:  Vital signs in last 24 hours: Vitals:   07/04/20 1944 07/04/20 1959 07/05/20 0029 07/05/20 0339  BP: (!) 105/50  116/86 129/68  Pulse: 91 93 84 71  Resp: 18  18 18   Temp: (!) 97.4 F (36.3 C)  98.4 F (36.9 C) (!) 97.4 F (36.3 C)  TempSrc: Oral  Oral Oral  SpO2: (!) 85% 90% 92% 96%  Weight:   52.5 kg   Height:        Physical Exam  Physical Exam Constitutional:      General: He is not in acute distress.    Appearance: He is not toxic-appearing.     Comments: Frail appearance  HENT:     Head: Normocephalic.  Eyes:     General: No scleral icterus.    Conjunctiva/sclera: Conjunctivae normal.  Cardiovascular:     Rate and Rhythm: Normal rate and regular rhythm.     Heart sounds: Normal heart sounds.  Pulmonary:     Effort: Pulmonary effort is normal. No respiratory distress.  Abdominal:     Palpations: Abdomen is soft.     Tenderness: There is no abdominal tenderness.  Musculoskeletal:     Right lower leg: Edema (+1 ankle edema) present.     Left lower leg: Edema (+1 ankle edema) present.  Skin:    General: Skin is warm.     Coloration: Skin is not jaundiced.  Neurological:     Mental Status: He is alert.  Psychiatric:     Comments: Depressed mood     Assessment/Plan: Ondre Salvetti a 79 year old man with a history of PUD, tobacco use, chronic A. fib, PAD, and HTN admitted for hyponatremia incidentally discovered in anticipation of EGD for odynophagia.EGD showed a mid-esophageal massconsistent with poorly differentiated SCC.Goal is to advance tube feed and go home with home health PT and  outpatient palliative care.  Principal Problem:   Hyponatremia Active Problems:   Atrial fibrillation status post cardioversion (HCC)   Lung nodule   Protein-calorie malnutrition, severe (HCC)   Odynophagia   Goals of care, counseling/discussion   Esophageal cancer, stage IIB (HCC)   Pressure injury of skin   Palliative care by specialist   DNR (do not resuscitate)   Bacteremia due to Klebsiella pneumoniae   Refeeding syndrome, malnutrition Malnutrition due to dysphagia and low solute intake.Pre-albumin of 13.8.G-tube placed, nutrition held forileus, now resumed. Continue bolus Tube feed and monitor for refeeding syndrome. Will also work together with family members to have a plan of how to take care of his gastric tube and giving nutrition through tube feed. His wife who was a nurse will be helping him at home.   Spoken with Aggie Hacker, his son, today. He said that patient will go home with home health. He also talked to the palliative team and they are working on getting a medical bed at home. He states that his family including himself, patient's wife and daughter will rotate to be with Mr. Poche. If patient continue to do well on bolus tube feed, will plan to discharge him in 1-2 days.  -Dietician consulted. Consolidate tube feed into  boluses:  1 can/ARC of Osmolite 1.5 237 mL five times daily with 45 ml ProSource or other protein modular BID. 100 ml free water flushes before and after each feeding. Monitor for refeeding syndrome.  - Patient has been having good BM - Continue to monitor for K, phos, and Mgdaily. -Continue thiamine 100 mgdaily - PPI prophylaxis - Continue on telemetry    CHF exacerbation Patient has history of tachycardia-mediated cardiomyopathy which he was seeing [facility] in 2018. Echo 05/31/2017 shows EF 20% when he was having a-fib with RVR. Repeat Echo 9/12 shows normal EF 55-60%.  CXR shows pulmonary edema and bilateral pleural effusions, which  consistent with CHF. He was on LR 125 cc/h which was stopped. Patient have good output 2200 cc of urine last night with 20 mg Lasix. Patient still has +1 ankle edema on exam. O2 sat 95% on 2L.  - Give one more dose of Lasix 20 mg today. Reassess fluid status tomorrow  - Monitor I/O and daily weight.    Stool impaction Stercoral colitis Patient is on Opiods for pain, which can worsen his constipation. He is now having regular bowel movements. Continue toholdbowel regimenofbisacodyl, Senokot and MiraLAX. Will restart if patient stops having BM.   Esophageal SCC EGD showed acircumferential, near obstructing, ulcerated mass in the midesophagus 22 cm from the incisors and extends 4-5 cm. CT head, abdomen and pelvis shows no metastatic findings. 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 Domingo [name] that patient is not a candidate for intervention at the moment given his nutritional status.Planned to start treatment when improved nutritional and functional status. -Dilaudid0.5mg  IVPRNQ3hfor pain.AddedPercocet7.5 - 325 mg/22ml Q8H PRN.His pain is under controlled. Goal is to keep patient comfortable. - GI recommend EUS outpatient procedure. Plan 08/08/20. -Palliative team recommended transition to home with PT/OT and outpatient palliative care. Home hospice can be consideredif patient is not doing better with above plan.    Normocytic anemia Iron studies revealed ferritin 131, TIBC normal at 272, iron saturation 5, iron 13.Vitamin K93, folic acid unremarkable. Combination of iron deficiency anemia and inflammation, secondary to ulcerated esophageal mass. - Feraheme 510 mg IVgiven7/6/21. Give another dose of Feraheme 510 mg today. - Continue CBC everyday.  -Continue Eliquis for a-fib andDVT prophylaxis. The benefit of DVT prophylaxis outweighs the bleeding risk. -Transfuse if Hgb < 7   Paroxysmal a-fib  - Heart rate within normal limit -  Continue Metoprololand Dronedaronethrough tube -Continue Eliquis   Diet:Tube feeds Fluid: no OIZ:TIWPYKD Code: DNR  Prior to Admission Living Arrangement:home Anticipated Discharge Location:homehealth PT Barriers to Discharge:G-tube, refeeding syndrome monitor Dispo: Anticipated discharge in approximately2 - 3 day(s).  [name] Gerold, DO 07/05/2020, 7:52 AM Pager: [phone] After 5pm on weekdays and 1pm on weekends: On Call pager [phone]

## 2020-07-05 NOTE — Progress Notes (Signed)
PT Cancellation Note  Patient Details Name: Daniel [name] MRN: [id number] DOB: [birth year]   Cancelled Treatment:    Reason Eval/Treat Not Completed: Other (comment) (Pt sats on 3LO2 were 89%-notified nursing. Pt also wanted medicine for chest pain. Pt refused PT adamantly. )   Wood Novacek F Dawnita Molner 07/05/2020, 2:01 PM  Helyne Genther W,PT Acute Rehabilitation Services Pager:  [phone]  Office:  [phone]

## 2020-07-06 DIAGNOSIS — R52 Pain, unspecified: Secondary | ICD-10-CM

## 2020-07-06 DIAGNOSIS — G893 Neoplasm related pain (acute) (chronic): Secondary | ICD-10-CM

## 2020-07-06 LAB — RENAL FUNCTION PANEL
Albumin: 1.7 g/dL — ABNORMAL LOW (ref 3.5–5.0)
Anion gap: 6 (ref 5–15)
BUN: 17 mg/dL (ref 8–23)
CO2: 20 mmol/L — ABNORMAL LOW (ref 22–32)
Calcium: 7.6 mg/dL — ABNORMAL LOW (ref 8.9–10.3)
Chloride: 106 mmol/L (ref 98–111)
Creatinine, Ser: 0.88 mg/dL (ref 0.61–1.24)
GFR calc Af Amer: >60 mL/min (ref >60)
GFR calc non Af Amer: >60 mL/min (ref >60)
Glucose, Bld: 90 mg/dL (ref 70–99)
Phosphorus: 2.5 mg/dL (ref 2.5–4.6)
Potassium: 5.1 mmol/L (ref 3.5–5.1)
Sodium: 132 mmol/L — ABNORMAL LOW (ref 135–145)

## 2020-07-06 LAB — CBC
HCT: 23.9 % — ABNORMAL LOW (ref 39.0–52.0)
Hemoglobin: 7.8 g/dL — ABNORMAL LOW (ref 13.0–17.0)
MCH: 32.8 pg (ref 26.0–34.0)
MCHC: 32.6 g/dL (ref 30.0–36.0)
MCV: 100.4 fL — ABNORMAL HIGH (ref 80.0–100.0)
Platelets: 305 10*3/uL (ref 150–400)
RBC: 2.38 MIL/uL — ABNORMAL LOW (ref 4.22–5.81)
RDW: 14.7 % (ref 11.5–15.5)
WBC: 6 10*3/uL (ref 4.0–10.5)
nRBC: 0 % (ref 0.0–0.2)

## 2020-07-06 LAB — GLUCOSE, CAPILLARY
Glucose-Capillary: 108 mg/dL — ABNORMAL HIGH (ref 70–99)
Glucose-Capillary: 110 mg/dL — ABNORMAL HIGH (ref 70–99)
Glucose-Capillary: 179 mg/dL — ABNORMAL HIGH (ref 70–99)
Glucose-Capillary: 179 mg/dL — ABNORMAL HIGH (ref 70–99)
Glucose-Capillary: 73 mg/dL (ref 70–99)
Glucose-Capillary: 92 mg/dL (ref 70–99)

## 2020-07-06 LAB — PHOSPHORUS: Phosphorus: 2.5 mg/dL (ref 2.5–4.6)

## 2020-07-06 LAB — MAGNESIUM: Magnesium: 2.1 mg/dL (ref 1.7–2.4)

## 2020-07-06 MED ORDER — HYDROMORPHONE HCL 1 MG/ML IJ SOLN
1.0000 mg | Freq: Once | INTRAMUSCULAR | Status: AC
  Administered 2020-07-06: 1 mg via INTRAVENOUS
  Filled 2020-07-06: qty 1

## 2020-07-06 MED ORDER — HYDROMORPHONE HCL 1 MG/ML IJ SOLN
1.0000 mg | INTRAMUSCULAR | Status: DC | PRN
  Administered 2020-07-06 – 2020-07-08 (×10): 1 mg via INTRAVENOUS
  Filled 2020-07-06 (×10): qty 1

## 2020-07-06 MED ORDER — FUROSEMIDE 10 MG/ML IJ SOLN
20.0000 mg | Freq: Once | INTRAMUSCULAR | Status: AC
  Administered 2020-07-06: 20 mg via INTRAVENOUS
  Filled 2020-07-06: qty 2

## 2020-07-06 NOTE — Progress Notes (Addendum)
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 His family elected for gastrostomy tube placement during hospitalization. He has severe cachexia and presumptive Stg II esophageal [name] we are awaiting final biopsy results for confirmation.  Palliative care was asked to get involved to aid in goals of care conversations.  Today's Discussion (07/06/2020): Chart reviewed. Spoke to primary medical team who share that patient has not made tremendous progress in the past week. There is concern at this point that Daniel [name] is experiencing more discomfort.   I called patients son, Daniel [name] to discuss the present situation. He was very surprised and shared with me that "the medical team said he is doing great." I told Daniel [name] that we really need to re-evaluate and look at the whole picture for the patient. If he is now at a point where he is experiencing more burden of continuation of care then its time we consider a transition in focus from curative to symptom relief. He shares that he is not presently coming in until three though he is open to meeting with out team at that time.  I met with Daniel [name] at bedside. I introduced the topic of hospice.  I described hospice as a service for patients for have a life expectancy of < 64month. It preserves dignity and quality at the end phases of life. The focus changes from curative to symptom relief.  [name] that he is "leaning towards this" but he would like to speak to his son. I shared with him the plan  for [place] to meet at 1500.   From a symptom perspective, [name] that is relieved with PRNs though this effect is relatively short. Given that he is not taking ER formulation as he has a G-Tube and they cannot be crushed we discussed the plan to increase his IV dilaudid dose.   Discussed the importance of continued conversation with family and their  medical providers regarding overall plan of care and treatment options, ensuring decisions are within the context of the patients values and GOCs.  Provided "Hard Choices for [place] booklet.   Questions and concerns addressed   SUMMARY OF RECOMMENDATIONS   DNAR/DNI  Plan for family meeting at 16to further discuss hospice  Patient himself feels that it is time to consider comfort oriented care  Increase dilaudid to 156mIV Q3H --> Will liberalize more comprehensively once able to verify goals with patient and his son  Time Spent: 35 Greater than 50% of the time was spent in counseling and coordination of care ______________________________________________________________________________________ Addendum:  I met with Daniel [name] his grandson at bedside. I shared with him the concerns of the medical team. Daniel Cadetxpressed that he does not understand why his dad is not feeling well today as he was told yesterday that his dad was "doing great." I offered that MaRuthann Cancernd I had met earlier in the day, he expressed that he is experiencing total body pain and no longer wants to continue doing this. He would like to be made comfortable and enjoy the time he has left.  Daniel [name] was able to say this in person was very tearful with his son stating that he no longer wants to be here. He shared that everybody has "got to go" and he has come to a point of acceptance with this reality. He told a story about his own father and when he was dying he denied him the opportunity to smoke a cigarette, he states that  he is very regretful of this and wishes that he could go back int time to relive this moment.  Daniel [name]'s grandson excused himself due to emotions.   Daniel [name] continued to challenge the patients wishes. He continued to say that he did not need to make this decision as he is depressed. He stated that the [name] is "not eating at his organs" and he can survive this. I tried at this point to share with Daniel [name] that overall Daniel [name] is extremely frail, he is debilitated from a mobility perspective. He is unable to enjoy sufficent nutrition due to pain of tube feeding. I shared that the treatments often offered for [name] have a true effect on patients even if he made it to getting treatment I worry that he may have a very poor outcome.  I asked for Daniel [name] to listen, truly listen to what his father was telling him. Daniel [name] told him that he is suffering and no longer wants to live this way. Daniel [name] stated that no one knows hi father like her does and he does not accept this.  Daniel [name] went on the say that he would want to be at peace in a residential facility and not be burdensome to his son. We reviewed what comfort care in the hospital entails. We talked about transition to comfort measures in house and what that would entail inclusive of medications to control pain, dyspnea, agitation, nausea, itching, and hiccups.  We discussed stopping all uneccessary measures such as blood draws, needle sticks, and frequent vital signs. I shared that often we monitor patients for 24-48 hours to verify symptom management before transition to a residential hospice.   Daniel [name] asked for an update from the medical team and he wanted more information to aid in his deciion making process.  I was able to reach out to Dr. Alfonse Spruce who came to bedside. He offered a comprehensive medical update. He shared that he worries that Daniel [name] may not have a good outcome and would prefer if we allow him to speak his mind on the things he  does and does not want as he has capacity.   Daniel [name] continued to focus on the [name] not being metastatic and on being stage one or stage two. This continued on for quite sometime.   I shared with Daniel [name] that if we look at [facility] whole clinical picture he is not doing well. I shared that I would not be surprised if he died within the next three months. Daniel [name] and his son heavily contested this saying that I am not god and I do not know.   Daniel [name] requested that we end the conversation there and reconvene later in the week. His sister is coming to town on Monday, the medical team and palliative care plan to meet again then.   Time in: 1316 Time Out: 1519 Time Spent: 243 minutes Greater than 50% of the time was spent in counseling and coordination of care  [facility] Team Team Cell Phone: [phone] Please utilize secure chat with additional questions, if there is no response within 30 minutes  please call the above phone number  Palliative Medicine Team providers are available by phone from 7am to 7pm daily and can be reached through the team cell phone.  Should this patient require assistance outside of these hours, please call the patient's attending physician.

## 2020-07-06 NOTE — Progress Notes (Signed)
Subjective:   Hospital day: 14  Overnight event: No  Mr. Creger reports that he was up all night and states that unfortunately the care he has been receiving here is not as he would expect. He expresses concern that he will not have the best treatment at home as he is unsure about the support he will receive. He endorses a lot of pain. At a point during our conversation, he expressed wishes to transition to hospice and we made him aware that we will have our palliative team re-evaluate him.   Objective:  Vital signs in last 24 hours: Vitals:   07/05/20 1724 07/05/20 1940 07/06/20 0005 07/06/20 0440  BP: (!) 115/58 (!) 118/58 113/65 129/61  Pulse: 85 (!) 107 72 80  Resp: 20 20 20 20   Temp: (!) 97.5 F (36.4 C) 98 F (36.7 C) 98.4 F (36.9 C) 97.8 F (36.6 C)  TempSrc:  Oral Oral Oral  SpO2: 94% 94% 95% 95%  Weight:    47.6 kg  Height:        Physical Exam  Physical Exam Constitutional:      General: He is not in acute distress. HENT:     Head: Normocephalic.  Eyes:     General: No scleral icterus.    Conjunctiva/sclera: Conjunctivae normal.  Cardiovascular:     Rate and Rhythm: Normal rate and regular rhythm.     Heart sounds: Normal heart sounds.  Pulmonary:     Effort: Pulmonary effort is normal. No respiratory distress.  Abdominal:     General: Bowel sounds are normal. There is no distension.     Palpations: Abdomen is soft.  Musculoskeletal:     Cervical back: Normal range of motion.     Right lower leg: Edema (trace) present.     Left lower leg: Edema (trace) present.  Skin:    General: Skin is warm.     Coloration: Skin is not jaundiced.  Neurological:     Mental Status: He is alert.  Psychiatric:     Comments: Depressed mood     Assessment/Plan: Limmie Schoenberg is a 79 year old man with a history of PUD, tobacco use, chronic A. fib, PAD, and HTN admitted for hyponatremia incidentally discovered in anticipation of EGD for odynophagia.EGD showed a  mid-esophageal massconsistent with poorly differentiated SCC.Goal is to advance tube feed and go home with home health PT and outpatient palliative care.  Principal Problem:   Hyponatremia Active Problems:   Atrial fibrillation status post cardioversion (HCC)   Lung nodule   Protein-calorie malnutrition, severe (HCC)   Odynophagia   Goals of care, counseling/discussion   Esophageal cancer, stage IIB (HCC)   Pressure injury of skin   Palliative care by specialist   DNR (do not resuscitate)   Bacteremia due to Klebsiella pneumoniae  Refeeding syndrome, malnutrition Malnutrition due to dysphagia and low solute intake.Pre-albumin of 13.8.G-tube placed, nutrition held forileus, now resumed. Continue bolus Tube feed and monitor for refeeding syndrome. Will also work together with family members to have a plan of how to take care of his gastric tube and giving nutrition through tube feed. His wife who was a nurse will be helping him at home.   Spoken with Aggie Hacker, his son, today. He said that patient will go home with home health. He also talked to the palliative team and they are working on getting a medical bed at home. He states that his family including himself, patient's wife and daughter will rotate to be with  Mr. Wynn. If patient continue to do well on bolus tube feed, will plan to discharge him in 1-2 days. Will work together with Case Management to arrange his home health situation.   -Dietician consulted.Consolidate tube feed into boluses:1 can/ARC of Osmolite 1.5237 mLfive times daily with45 ml ProSource or other protein modular BID.100 ml free water flushes before and after each feeding. Monitor for refeeding syndrome. Patient tolerates the feed well.  - Continue to monitor for K, phos, and Mgdaily. -Continue thiamine 100 mgdaily - PPI prophylaxis - Continue on telemetry    Esophageal SCC EGD showed acircumferential, near obstructing, ulcerated mass in the  midesophagus 22 cm from the incisors and extends 4-5 cm. CT head, abdomen and pelvis shows no metastatic findings. 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 Domingo [name] that patient is not a candidate for intervention at the moment given his nutritional status.Planned to start treatment when improved nutritional and functional status. -Dilaudid0.5mg  IVPRNQ3hfor pain.AddedPercocet7.5 - 325 mg/14ml Q8H PRN.His pain is under controlled. Goal is to keep patient comfortable. - GI recommend EUS outpatient procedure. Plan 08/08/20. -Palliative team recommended transition to home with PT/OT and outpatient palliative care. Home hospice can be consideredif patient is not doing better with above plan.  -Patient appear to decline in the past few days. He expresses that he does not want to suffer. We consulted palliative care to re-evaluate him for symptom management.    CHF exacerbation Patient has history of tachycardia-mediated cardiomyopathy which he was seeing [facility] in 2018. Echo 05/31/2017 shows EF 20% when he was having a-fib with RVR. Repeat Echo 9/12 shows normal EF 55-60%.  CXR shows pulmonary edema and bilateral pleural effusions, which consistent with CHF. He was on LR 125 cc/h which was stopped. Patient have good output 4L of urine last night with 20 mg Lasix. He still has trace edema on exam and still on oxygen supplement. - Give one more dose of Lasix 20 mg today. Reassess fluid status tomorrow  - Monitor I/O and daily weight.    Stool impaction Stercoral colitis Patient is on Opiods for pain, which can worsen his constipation. He is now having regular bowel movements. Continue toholdbowel regimenofbisacodyl, Senokot and MiraLAX. Will restart if patient stops having BM.   Normocytic anemia Iron studies revealed ferritin 131, TIBC normal at 272, iron saturation 5, iron 13.Vitamin T36, folic acid unremarkable. Combination of iron deficiency  anemia and inflammation, secondary to ulcerated esophageal mass. - Feraheme 510 mg IVgiven7/6/21 and 07/05/20 - Continue CBC everyday.  -Continue Eliquis for a-fib andDVT prophylaxis. The benefit of DVT prophylaxis outweighs the bleeding risk. -Transfuse if Hgb < 7   Paroxysmal a-fib  - Heart rate within normal limit - Continue Metoprololand Dronedaronethrough tube -Continue Eliquis   Diet:Tube feeds Fluid:no IWO:[id number] Code: DNR   Prior to Admission Living Arrangement: home Anticipated Discharge Location: home  Barriers to Discharge: monitoring refeeding syndrome Dispo: Anticipated discharge in approximately 2-3 day(s).   [name] Gerold, DO 07/06/2020, 5:58 AM Pager: [phone] After 5pm on weekdays and 1pm on weekends: On Call pager [phone]

## 2020-07-07 LAB — GLUCOSE, CAPILLARY
Glucose-Capillary: 108 mg/dL — ABNORMAL HIGH (ref 70–99)
Glucose-Capillary: 137 mg/dL — ABNORMAL HIGH (ref 70–99)
Glucose-Capillary: 79 mg/dL (ref 70–99)
Glucose-Capillary: 83 mg/dL (ref 70–99)
Glucose-Capillary: 93 mg/dL (ref 70–99)
Glucose-Capillary: 97 mg/dL (ref 70–99)

## 2020-07-07 LAB — RENAL FUNCTION PANEL
Albumin: 1.9 g/dL — ABNORMAL LOW (ref 3.5–5.0)
Anion gap: 7 (ref 5–15)
BUN: 18 mg/dL (ref 8–23)
CO2: 21 mmol/L — ABNORMAL LOW (ref 22–32)
Calcium: 8.6 mg/dL — ABNORMAL LOW (ref 8.9–10.3)
Chloride: 105 mmol/L (ref 98–111)
Creatinine, Ser: 1.01 mg/dL (ref 0.61–1.24)
GFR calc Af Amer: >60 mL/min (ref >60)
GFR calc non Af Amer: >60 mL/min (ref >60)
Glucose, Bld: 142 mg/dL — ABNORMAL HIGH (ref 70–99)
Phosphorus: 2.5 mg/dL (ref 2.5–4.6)
Potassium: 5.3 mmol/L — ABNORMAL HIGH (ref 3.5–5.1)
Sodium: 133 mmol/L — ABNORMAL LOW (ref 135–145)

## 2020-07-07 LAB — CBC
HCT: 27.3 % — ABNORMAL LOW (ref 39.0–52.0)
Hemoglobin: 8.6 g/dL — ABNORMAL LOW (ref 13.0–17.0)
MCH: 32.5 pg (ref 26.0–34.0)
MCHC: 31.5 g/dL (ref 30.0–36.0)
MCV: 103 fL — ABNORMAL HIGH (ref 80.0–100.0)
Platelets: 344 10*3/uL (ref 150–400)
RBC: 2.65 MIL/uL — ABNORMAL LOW (ref 4.22–5.81)
RDW: 14.8 % (ref 11.5–15.5)
WBC: 7.1 10*3/uL (ref 4.0–10.5)
nRBC: 0 % (ref 0.0–0.2)

## 2020-07-07 LAB — MAGNESIUM: Magnesium: 2.2 mg/dL (ref 1.7–2.4)

## 2020-07-07 MED ORDER — SODIUM ZIRCONIUM CYCLOSILICATE 10 G PO PACK
10.0000 g | PACK | Freq: Once | ORAL | Status: AC
  Administered 2020-07-07: 10 g via ORAL
  Filled 2020-07-07: qty 1

## 2020-07-07 NOTE — Progress Notes (Signed)
[facility] ([facility])  [facility] will follow with palliative services in the community for Mr. Frosch once he is discharged, per referral from PMT.  Venia Carbon RN, BSN, [facility] Liaison

## 2020-07-07 NOTE — Progress Notes (Signed)
Subjective:   Hospital day: 15  Overnight event: [name] had a conversation with Sharyn Lull of the Paliiative care team yesterday afternoon and per her notes, patient would lean towards comfort care. I had a conversation with the patient, his son Daniel [name] and patient's grandson, with the presence of Sharyn Lull. I expressed that I understand Daniel [name]'s concern and try to explain to him that we are doing our best in patient's interest and honor his wishes. I told him that patient has expressed to [place] that he does not want to suffer like this. The original plan was to advance with tube feed and patient would go home with home health PT and outpatient palliative care. However, patient seems like he is not making any progress with the tube feed and has been refusing ambulation with PT. I encouraged conversations between patient and family members to make decision together. We would meet again on Monday with [place] and his sister who is coming to town. Please see Daniel [name]'s notes for more details.   Mr Daniel [name] looked quite sad this morning. He only had bland answers to our assessment. He states that he is pain-free at the moment. No other complaints.   Objective:  Vital signs in last 24 hours: Vitals:   07/06/20 2015 07/07/20 0015 07/07/20 0020 07/07/20 0404  BP: 100/83 (!) 109/57  (!) 118/55  Pulse: 86 74  73  Resp: 15 16  15   Temp: 97.7 F (36.5 C) 98.1 F (36.7 C)  97.7 F (36.5 C)  TempSrc: Oral Oral  Oral  SpO2: 97% 91%  97%  Weight:   46.7 kg   Height:        Physical Exam  Physical Exam Constitutional:      General: He is not in acute distress.    Comments: frail  HENT:     Head: Normocephalic.  Eyes:     General: No scleral icterus.    Conjunctiva/sclera: Conjunctivae normal.  Cardiovascular:     Rate and Rhythm: Normal rate and regular rhythm.     Heart sounds: Normal heart sounds.  Pulmonary:     Effort: Pulmonary effort is normal. No respiratory distress.  Abdominal:      General: Bowel sounds are normal.  Musculoskeletal:        General: Normal range of motion.     Right lower leg: No edema.     Left lower leg: No edema.  Skin:    General: Skin is warm.     Coloration: Skin is not jaundiced.  Neurological:     Mental Status: He is alert.  Psychiatric:     Comments: Flat affect. Uninterested in conversations      Assessment/Plan: Daniel [name] is a 79 year old man with a history of PUD, tobacco use, chronic A. fib, PAD, and HTN admitted for hyponatremia incidentally discovered in anticipation of EGD for odynophagia.EGD showed a mid-esophageal massconsistent with poorly differentiated SCC.Long term plan: go home with home health PT and outpatient palliative care vs comfort care.  Principal Problem:   Hyponatremia Active Problems:   Atrial fibrillation status post cardioversion (HCC)   Lung nodule   Protein-calorie malnutrition, severe (HCC)   Odynophagia   Goals of care, counseling/discussion   Esophageal cancer, stage IIB (HCC)   Pressure injury of skin   Palliative care by specialist   DNR (do not resuscitate)   Bacteremia due to Klebsiella pneumoniae   Pain   Pain of metastatic malignancy  Refeeding syndrome, malnutrition Malnutrition due to dysphagia and  low solute intake.Pre-albumin of 13.8.G-tube placed, nutrition held forileus, now resumed.Continue bolus Tube feed and monitor for refeeding syndrome. -Dietician consulted.Consolidate tube feed into boluses:1 can/ARC of Osmolite 1.5237 mLfive times daily with45 ml ProSource or other protein modular BID.100 ml free water flushes before and after each feeding. Monitor for refeeding syndrome.Patient tolerates the feed well.  - Continue to monitor for K, phos, and Mgdaily. - K 5.3 this morning. Added Lokelma 10 g once. Recheck BMP in AM -Continue thiamine 100 mgdaily - PPI prophylaxis - Continue on telemetry    Esophageal SCC EGD showed acircumferential,  near obstructing, ulcerated mass in the midesophagus 22 cm from the incisors and extends 4-5 cm. CT head, abdomen and pelvis shows no metastatic findings. 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 Domingo [name] that patient is not a candidate for intervention at the moment given his nutritional status.Planned to start treatment when improved nutritional and functional status. -Dilaudid0.5mg  IVPRNQ3hfor pain.AddedPercocet7.5 - 325 mg/58ml Q8H PRN.His pain is under controlled. Goal is to keep patient comfortable. - GI recommend EUS outpatient procedure. Plan 08/08/20. -Palliative team recommended transition to home with PT/OT and outpatient palliative care. Home hospice can be consideredif patient is not doing better with above plan.  -Patient appear to decline in the past few days. He expresses that he does not want to suffer. We consulted palliative care to re-evaluate him for symptom management. Will meet with family on Monday with his son and daughter to discuss long term goal: home health with outpatient palliative care vs comfort care.    CHF exacerbation Patient has history of tachycardia-mediated cardiomyopathy which he was seeing [facility] in 2018. Echo 05/31/2017 shows EF 20% when he was having a-fib with RVR. Repeat Echo 9/12 shows normal EF 55-60%. CXR shows pulmonary edema and bilateral pleural effusions, which consistent with CHF. He was on LR 125 cc/h which was stopped.  - Patient appears euvolemic on exam. LE edema resolve - Monitor I/O and daily weight.    Stool impaction Stercoral colitis Patient is on Opiods for pain, which can worsen his constipation.He is now having regular bowel movements. Continue toholdbowel regimenofbisacodyl, Senokot and MiraLAX. Will restart if patient stops having BM.   Normocytic anemia Iron studies revealed ferritin 131, TIBC normal at 272, iron saturation 5, iron 13.Vitamin L29, folic acid  unremarkable. Combination of iron deficiency anemia and inflammation, secondary to ulcerated esophageal mass. - Feraheme 510 mg IVgiven7/6/21 and 07/05/20 - Continue CBC everyday.  -Continue Eliquis for a-fib andDVT prophylaxis. The benefit of DVT prophylaxis outweighs the bleeding risk. -Transfuse if Hgb < 7   Paroxysmal a-fib  - Heart rate within normal limit - Continue Metoprololand Dronedaronethrough tube -Continue Eliquis   Diet:Tube feeds Fluid:no [id number] Code: DNR   Prior to Admission Living Arrangement: home Anticipated Discharge Location: home vs hospice facility Barriers to Discharge: monitoring refeeding syndrome Dispo: Anticipated discharge in approximately 2-3 day(s).   [name] Gerold, DO 07/07/2020, 8:01 AM Pager: [phone] After 5pm on weekdays and 1pm on weekends: On Call pager [phone]

## 2020-07-07 NOTE — Progress Notes (Signed)
   Palliative Medicine Inpatient Follow Up Note   HPI: Reason for consult:Goals of Care "Esophageal SCC, malnutrition"  HPI: Per intake H&P -->Mr. 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 His family elected for gastrostomy tube placement during hospitalization. He has severe cachexia and presumptive Stg II esophageal cancer we are awaiting final biopsy results for confirmation.  Palliative care was asked to get involved to aid in goals of care conversations.  Today's Discussion (07/07/2020): Chart reviewed. I met with the bedside nursing staff who shares that Saifan remains to have persistent pain most especially after G-Tube feedings. They remain to give dilaudid and percocet consistently.  I met with Ruthann Cancer at bedside. He stated that his overnight was terrible and he is hopeful to get some rest today. He does feel that the increase in dilaudid dose has helped him to some degree.   I asked Wang if he had any questions from our conversation yesterday. He stated "you are catching me at a bad time, I don't want to talk without my family present." I shared with Iktan that I am available when he would like to speak.   We reviewed the plan to have a family meeting tomorrow.   Discussed the importance of continued conversation with family and their  medical providers regarding overall plan of care and treatment options, ensuring decisions are within the context of the patients values and GOCs.  Questions and concerns addressed   SUMMARY OF RECOMMENDATIONS   DNAR/DNI  Plan for family meeting tomorrow, time is not known presently though the primary medical team and  palliative care plan to be in attendance  Ongoing PMT support  Time Spent: 25 Greater than 50% of the time was spent in counseling and coordination of care  [place] Team Team Cell Phone: [phone] Please utilize secure chat with additional questions, if there is no response within 30 minutes please call the above phone number  Palliative Medicine Team providers are available by phone from 7am to 7pm daily and can be reached through the team cell phone.  Should this patient require assistance outside of these hours, please call the patient's attending physician.

## 2020-07-08 DIAGNOSIS — E43 Unspecified severe protein-calorie malnutrition: Secondary | ICD-10-CM

## 2020-07-08 LAB — BASIC METABOLIC PANEL
Anion gap: 5 (ref 5–15)
Anion gap: 6 (ref 5–15)
BUN: 21 mg/dL (ref 8–23)
BUN: 23 mg/dL (ref 8–23)
CO2: 22 mmol/L (ref 22–32)
CO2: 22 mmol/L (ref 22–32)
Calcium: 9 mg/dL (ref 8.9–10.3)
Calcium: 9.3 mg/dL (ref 8.9–10.3)
Chloride: 105 mmol/L (ref 98–111)
Chloride: 103 mmol/L (ref 98–111)
Creatinine, Ser: 0.95 mg/dL (ref 0.61–1.24)
Creatinine, Ser: 1.03 mg/dL (ref 0.61–1.24)
GFR calc Af Amer: >60 mL/min (ref >60)
GFR calc Af Amer: >60 mL/min (ref >60)
GFR calc non Af Amer: >60 mL/min (ref >60)
GFR calc non Af Amer: >60 mL/min (ref >60)
Glucose, Bld: 116 mg/dL — ABNORMAL HIGH (ref 70–99)
Glucose, Bld: 96 mg/dL (ref 70–99)
Potassium: 5.1 mmol/L (ref 3.5–5.1)
Potassium: 6.1 mmol/L — ABNORMAL HIGH (ref 3.5–5.1)
Sodium: 132 mmol/L — ABNORMAL LOW (ref 135–145)
Sodium: 131 mmol/L — ABNORMAL LOW (ref 135–145)

## 2020-07-08 LAB — RENAL FUNCTION PANEL
Albumin: 1.9 g/dL — ABNORMAL LOW (ref 3.5–5.0)
Anion gap: 4 — ABNORMAL LOW (ref 5–15)
BUN: 22 mg/dL (ref 8–23)
CO2: 22 mmol/L (ref 22–32)
Calcium: 8.8 mg/dL — ABNORMAL LOW (ref 8.9–10.3)
Chloride: 106 mmol/L (ref 98–111)
Creatinine, Ser: 0.95 mg/dL (ref 0.61–1.24)
GFR calc Af Amer: >60 mL/min (ref >60)
GFR calc non Af Amer: >60 mL/min (ref >60)
Glucose, Bld: 129 mg/dL — ABNORMAL HIGH (ref 70–99)
Phosphorus: 2.9 mg/dL (ref 2.5–4.6)
Potassium: 5.8 mmol/L — ABNORMAL HIGH (ref 3.5–5.1)
Sodium: 132 mmol/L — ABNORMAL LOW (ref 135–145)

## 2020-07-08 LAB — CBC
HCT: 25.3 % — ABNORMAL LOW (ref 39.0–52.0)
Hemoglobin: 8.1 g/dL — ABNORMAL LOW (ref 13.0–17.0)
MCH: 33.5 pg (ref 26.0–34.0)
MCHC: 32 g/dL (ref 30.0–36.0)
MCV: 104.5 fL — ABNORMAL HIGH (ref 80.0–100.0)
Platelets: 331 10*3/uL (ref 150–400)
RBC: 2.42 MIL/uL — ABNORMAL LOW (ref 4.22–5.81)
RDW: 15 % (ref 11.5–15.5)
WBC: 5.5 10*3/uL (ref 4.0–10.5)
nRBC: 0.4 % — ABNORMAL HIGH (ref 0.0–0.2)

## 2020-07-08 LAB — GLUCOSE, CAPILLARY
Glucose-Capillary: 90 mg/dL (ref 70–99)
Glucose-Capillary: 100 mg/dL — ABNORMAL HIGH (ref 70–99)
Glucose-Capillary: 116 mg/dL — ABNORMAL HIGH (ref 70–99)
Glucose-Capillary: 92 mg/dL (ref 70–99)
Glucose-Capillary: 157 mg/dL — ABNORMAL HIGH (ref 70–99)
Glucose-Capillary: 83 mg/dL (ref 70–99)

## 2020-07-08 LAB — CK: Total CK: 23 U/L — ABNORMAL LOW (ref 49–397)

## 2020-07-08 MED ORDER — SODIUM ZIRCONIUM CYCLOSILICATE 10 G PO PACK
10.0000 g | PACK | Freq: Two times a day (BID) | ORAL | Status: AC
  Administered 2020-07-08 (×2): 10 g
  Filled 2020-07-08: qty 1

## 2020-07-08 MED ORDER — DEXTROSE 50 % IV SOLN
50.0000 mL | Freq: Once | INTRAVENOUS | Status: AC
  Administered 2020-07-08: 50 mL via INTRAVENOUS
  Filled 2020-07-08: qty 50

## 2020-07-08 MED ORDER — CALCIUM GLUCONATE-NACL 2-0.675 GM/100ML-% IV SOLN
2.0000 g | Freq: Once | INTRAVENOUS | Status: AC
  Administered 2020-07-08: 2000 mg via INTRAVENOUS
  Filled 2020-07-08: qty 100

## 2020-07-08 MED ORDER — OXYCODONE HCL 5 MG PO TABS
10.0000 mg | ORAL_TABLET | ORAL | Status: DC | PRN
  Administered 2020-07-08 – 2020-07-09 (×5): 10 mg via ORAL
  Filled 2020-07-08 (×3): qty 2

## 2020-07-08 MED ORDER — ACETAMINOPHEN 160 MG/5ML PO SOLN
650.0000 mg | Freq: Four times a day (QID) | ORAL | Status: DC
  Administered 2020-07-08 – 2020-07-09 (×6): 650 mg
  Filled 2020-07-08 (×7): qty 20.3

## 2020-07-08 MED ORDER — OXYCODONE HCL 5 MG PO TABS
10.0000 mg | ORAL_TABLET | Freq: Four times a day (QID) | ORAL | Status: DC | PRN
  Administered 2020-07-08 – 2020-07-09 (×2): 10 mg via ORAL
  Filled 2020-07-08 (×4): qty 2

## 2020-07-08 MED ORDER — INSULIN ASPART 100 UNIT/ML ~~LOC~~ SOLN
10.0000 [IU] | Freq: Once | SUBCUTANEOUS | Status: AC
  Administered 2020-07-08: 10 [IU] via INTRAVENOUS

## 2020-07-08 MED ORDER — LORAZEPAM 0.5 MG PO TABS
0.5000 mg | ORAL_TABLET | Freq: Four times a day (QID) | ORAL | Status: DC | PRN
  Administered 2020-07-08 – 2020-07-09 (×2): 0.5 mg via ORAL
  Filled 2020-07-08 (×3): qty 1

## 2020-07-08 MED ORDER — INSULIN ASPART 100 UNIT/ML ~~LOC~~ SOLN: 10.0000 [IU] | Freq: Once | SUBCUTANEOUS | Status: DC

## 2020-07-08 NOTE — Progress Notes (Signed)
PT Cancellation Note  Patient Details Name: Daniel [name] MRN: [id number] DOB: [birth year]   Cancelled Treatment:    Reason Eval/Treat Not Completed: (P) Patient declined, no reason specified Pt requests not to work with therapy today. Pt asked if he still wants PT continue to stop by and ask to walk. Pt family requests PT continue to attempt with therapy.   Grant Swager B. Migdalia Dk PT, DPT Acute Rehabilitation Services Pager [phone] Office [phone]  [place] 07/08/2020, 2:27 PM

## 2020-07-08 NOTE — Progress Notes (Signed)
Subjective:   Hospital day: 16  Overnight event: none  Patient is seen at bedside today. He appears sad and seems to be uninterested in conversation. Patient states that he still has pain but can not tell us exactly where. Patient also states that he does not like to ambulate with PT.  Will talk with family and patient about long-term goal today.   Objective:  Vital signs in last 24 hours: Vitals:   07/07/20 1708 07/07/20 2048 07/08/20 0055 07/08/20 0300  BP: (!) 111/52 (!) 113/56 (!) 103/46 110/62  Pulse: 97 74 67 71  Resp: 18 18 18 15   Temp:  97.8 F (36.6 C) 97.7 F (36.5 C) 98.3 F (36.8 C)  TempSrc:  Oral Oral Oral  SpO2: 98% 100% 94% 99%  Weight:   44.4 kg   Height:        Physical Exam  Physical Exam Constitutional:      General: He is not in acute distress.    Appearance: He is not toxic-appearing.     Comments: frail  HENT:     Head: Normocephalic.  Eyes:     General: No scleral icterus.    Conjunctiva/sclera: Conjunctivae normal.  Cardiovascular:     Rate and Rhythm: Normal rate and regular rhythm.     Heart sounds: Normal heart sounds.  Pulmonary:     Effort: Pulmonary effort is normal. No respiratory distress.  Abdominal:     General: Abdomen is flat. Bowel sounds are normal. There is no distension.     Tenderness: There is no abdominal tenderness.  Musculoskeletal:        General: No tenderness.     Cervical back: Normal range of motion.     Right lower leg: No edema.     Left lower leg: No edema.  Skin:    General: Skin is warm.     Coloration: Skin is not jaundiced.  Neurological:     Mental Status: He is alert.     Assessment/Plan: Artice Bergerson a 79 year old man with a history of PUD, tobacco use, chronic A. fib, PAD, and HTN admitted for hyponatremia incidentally discovered in anticipation of EGD for odynophagia.EGD showed a mid-esophageal massconsistent with poorly differentiated SCC.Long term plan: go home with home health PT  and outpatient palliative care vs comfort care.  Principal Problem:   Hyponatremia Active Problems:   Atrial fibrillation status post cardioversion (HCC)   Lung nodule   Protein-calorie malnutrition, severe (HCC)   Odynophagia   Goals of care, counseling/discussion   Esophageal cancer, stage IIB (HCC)   Pressure injury of skin   Palliative care by specialist   DNR (do not resuscitate)   Bacteremia due to Klebsiella pneumoniae   Pain   Pain of metastatic malignancy   Refeeding syndrome, malnutrition Malnutrition due to dysphagia and low solute intake.Pre-albumin of 13.8.G-tube placed, nutrition held forileus, now resumed.Continue bolus Tube feed and monitor for refeeding syndrome. -Dietician consulted.Consolidate tube feed into boluses:1 can/ARC of Osmolite 1.5237 mLfive times daily with45 ml ProSource or other protein modular BID.100 ml free water flushes before and after each feeding. Monitor for refeeding syndrome.Patient tolerates the feed well. - Continue to monitor for BMPdaily. -Continue thiamine 100 mgdaily - PPI prophylaxis - Continue on telemetry    Hyperkalemia - AM Potasium 5.8. EKG shows peaked T wave on Lead II, III, V3 and V4. Calcium gluconate 2g was given. Added 10 Insulin Aspart with 50 ml D50. Lokelma 10g x 2 doses. Repeat BMP show potasium  of 5.1 - Recheck EKG - Recheck BMP at 1 PM    Esophageal SCC EGD showed acircumferential, near obstructing, ulcerated mass in the midesophagus 22 cm from the incisors and extends 4-5 cm. CT head, abdomen and pelvis shows no metastatic findings. 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 Domingo [name] that patient is not a candidate for intervention at the moment given his nutritional status.Planned to start treatment when improved nutritional and functional status. -Dilaudid1mg  IVPRNQ3hfor pain.Percocet7.5 - 325 mg/24ml Q8H PRN. Goal is to keep patient comfortable.Consider  starting PSA.  - GI recommend EUS outpatient procedure. Plan 08/08/20. -Patient expresses that he does not want to suffer. We consulted palliative care to re-evaluate him for symptom management.Will meet with family on today with his son and daughter to discuss long term goal: home health with outpatient palliative care vs comfort care.    CHF exacerbation Patient has history of tachycardia-mediated cardiomyopathy which he was seeing [facility] in 2018. Echo 05/31/2017 shows EF 20% when he was having a-fib with RVR. Repeat Echo 9/12 shows normal EF 55-60%. CXR shows pulmonary edema and bilateral pleural effusions, which consistent with CHF.  - Patient appears euvolemic on exam. LE edema resolve - Monitor I/O and daily weight.    Stool impaction Stercoral colitis Patient is on Opiods for pain, which can worsen his constipation.He is now having regular bowel movements. Continue toholdbowel regimenofbisacodyl, Senokot and MiraLAX. Will restart if patient stops having BM.   Normocytic anemia Iron studies revealed ferritin 131, TIBC normal at 272, iron saturation 5, iron 13.Vitamin I14, folic acid unremarkable. Combination of iron deficiency anemia and inflammation, secondary to ulcerated esophageal mass. - Feraheme 510 mg IVgiven7/6/21and 07/05/20 - Continue CBC everyday.  -Continue Eliquis for a-fib andDVT prophylaxis. The benefit of DVT prophylaxis outweighs the bleeding risk. -Transfuse if Hgb < 7   Paroxysmal a-fib  - Heart rate within normal limit - Continue Metoprololand Dronedaronethrough tube -Continue Eliquis   Diet:Tube feeds Fluid:no ERX:[id number] Code: DNR  Prior to Admission Living Arrangement:home Anticipated Discharge Location:homevs hospice facility Barriers to [place] refeeding syndrome Dispo: Anticipated discharge in approximately2-3day(s).    [name] Gerold, DO 07/08/2020, 8:10 AM Pager: [phone] After 5pm  on weekdays and 1pm on weekends: On Call pager [phone]

## 2020-07-08 NOTE — Progress Notes (Signed)
Patient ID: Daniel [name], male   DOB: 09-04-1941, 79 y.o.   MRN: [id number]  This NP visited patient at the bedside as a follow up for paliative medicine needs and emotional support.  I met with Mr. Chiarelli previously this morning and he very clearly verbalized an understanding of the seriousness of his current medical situation and his belief that his body is "giving out".  He verbalizes "I just wish I could disappear".  On further investigation he verbalizes an understanding that he is indeed dying and his hope is to be at home and to be comfortable.  Meet at bedside with wife/ [name] and son/ [name] as scheduled for continued conversation regarding current medical situation, goals of care,  end-of-life wishes, disposition and options.  Patient's wife [name] is a longtime CNA and verbalizes a very clear understanding of the patient's multiple comorbidities and supports the patient's own decisions Patient's son verbalizes frustration with his belief that medical staff "just wanted let him go ahead and die". Emotional support offered and education offered regarding advanced directives and the patient's right to autonomy and decisions regarding medical interventions.  The difference between an aggressive medical intervention path and a palliative comfort path for this patient at this time was detailed. Education on hospice benefit both at home and in a residential facility was detailed.  Patient's son decided to not participate in any further conversation and left the room.  Both the patient and his wife verbalized frustration with their son and state "he does not understand and he is hardheaded"  Questions and concerns addressed  Plan of care -DNR/DNI -Continue tube feeds -Discharge home with home health, patient hopes to continue rehab at home -Symptom management, convert all meds to via tube in anticipation of going home-----management strategy discussed with Dr. Beverlyn Roux -Recommend  outpatient community-based palliative. -Further decisions regarding oncology follow-up dependent on outcomes over the next weeks -Patient and family encouraged to call with questions or concerns   Discussed with patient the importance of continued conversation with family and their  medical providers regarding overall plan of care and treatment options,  ensuring decisions are within the context of the patients values and GOCs.  Questions and concerns addressed   Discussed with Dr Alfonse Spruce  Total time spent on the unit was 45 minutes  Greater than 50% of the time was spent in counseling and coordination of care  Wadie Lessen NP  Palliative Medicine Team Team Phone # [phone] Pager [phone]

## 2020-07-08 NOTE — TOC Progression Note (Signed)
Transition of Care [facility]) - Progression Note    Patient Details  Name: Daniel [name] MRN: [id number] Date of Birth: 1941-02-16  Transition of Care [facility]) CM/SW Contact  Zenon Mayo, RN Phone Number: 07/08/2020, 3:53 PM  Clinical Narrative:    Patient is set up with [facility] for [facility], HHPT, he is also set up with [facility] for palliative services outpatient. Per MD family requesting hospital bed.  NCM spoke with son, and he did confirm that they would like hospital bed, son will be the contact for the delivery. Awaiting call back from [facility] regarding the tube feeds, which will be bolus now.  NCM notified Cherly with [facility] that patient is for possible dc tomorrow and she states great soc will be on Wed.    Expected Discharge Plan: [place] Barriers to Discharge: Continued Medical Work up  Expected Discharge Plan and Services Expected Discharge Plan: [place]   Discharge Planning Services: CM Consult Post Acute Care Choice: [place] arrangements for the past 2 months: Apartment                 DME Arranged: Tube feeding, Tube feeding pump         HH Arranged: RN, PT           Social Determinants of Health (SDOH) Interventions    Readmission Risk Interventions No flowsheet data found.

## 2020-07-09 ENCOUNTER — Ambulatory Visit: Payer: Medicare Other | Admitting: Nurse Practitioner

## 2020-07-09 LAB — RENAL FUNCTION PANEL
Albumin: 2 g/dL — ABNORMAL LOW (ref 3.5–5.0)
Anion gap: 4 — ABNORMAL LOW (ref 5–15)
BUN: 22 mg/dL (ref 8–23)
CO2: 24 mmol/L (ref 22–32)
Calcium: 9.3 mg/dL (ref 8.9–10.3)
Chloride: 105 mmol/L (ref 98–111)
Creatinine, Ser: 1.03 mg/dL (ref 0.61–1.24)
GFR calc Af Amer: >60 mL/min (ref >60)
GFR calc non Af Amer: >60 mL/min (ref >60)
Glucose, Bld: 143 mg/dL — ABNORMAL HIGH (ref 70–99)
Phosphorus: 3.4 mg/dL (ref 2.5–4.6)
Potassium: 5.4 mmol/L — ABNORMAL HIGH (ref 3.5–5.1)
Sodium: 133 mmol/L — ABNORMAL LOW (ref 135–145)

## 2020-07-09 LAB — BASIC METABOLIC PANEL
Anion gap: 6 (ref 5–15)
BUN: 22 mg/dL (ref 8–23)
CO2: 22 mmol/L (ref 22–32)
Calcium: 9 mg/dL (ref 8.9–10.3)
Chloride: 105 mmol/L (ref 98–111)
Creatinine, Ser: 0.99 mg/dL (ref 0.61–1.24)
GFR calc Af Amer: >60 mL/min (ref >60)
GFR calc non Af Amer: >60 mL/min (ref >60)
Glucose, Bld: 97 mg/dL (ref 70–99)
Potassium: 4.9 mmol/L (ref 3.5–5.1)
Sodium: 133 mmol/L — ABNORMAL LOW (ref 135–145)

## 2020-07-09 LAB — GLUCOSE, CAPILLARY
Glucose-Capillary: 106 mg/dL — ABNORMAL HIGH (ref 70–99)
Glucose-Capillary: 109 mg/dL — ABNORMAL HIGH (ref 70–99)
Glucose-Capillary: 122 mg/dL — ABNORMAL HIGH (ref 70–99)
Glucose-Capillary: 127 mg/dL — ABNORMAL HIGH (ref 70–99)
Glucose-Capillary: 151 mg/dL — ABNORMAL HIGH (ref 70–99)
Glucose-Capillary: 63 mg/dL — ABNORMAL LOW (ref 70–99)
Glucose-Capillary: 75 mg/dL (ref 70–99)
Glucose-Capillary: 76 mg/dL (ref 70–99)
Glucose-Capillary: 87 mg/dL (ref 70–99)

## 2020-07-09 LAB — CBC
HCT: 25.9 % — ABNORMAL LOW (ref 39.0–52.0)
Hemoglobin: 8.3 g/dL — ABNORMAL LOW (ref 13.0–17.0)
MCH: 33.7 pg (ref 26.0–34.0)
MCHC: 32 g/dL (ref 30.0–36.0)
MCV: 105.3 fL — ABNORMAL HIGH (ref 80.0–100.0)
Platelets: 364 10*3/uL (ref 150–400)
RBC: 2.46 MIL/uL — ABNORMAL LOW (ref 4.22–5.81)
RDW: 15.5 % (ref 11.5–15.5)
WBC: 5.7 10*3/uL (ref 4.0–10.5)
nRBC: 0.4 % — ABNORMAL HIGH (ref 0.0–0.2)

## 2020-07-09 MED ORDER — SODIUM ZIRCONIUM CYCLOSILICATE 10 G PO PACK
10.0000 g | PACK | Freq: Once | ORAL | Status: AC
  Administered 2020-07-09: 10 g
  Filled 2020-07-09: qty 1

## 2020-07-09 MED ORDER — LOKELMA 5 G PO PACK: 5.0000 g | PACK | Freq: Every day | ORAL | 0 refills | Status: AC

## 2020-07-09 MED ORDER — OSMOLITE 1.5 CAL PO LIQD
237.0000 mL | Freq: Every day | ORAL | Status: DC
  Administered 2020-07-09 (×4): 237 mL
  Filled 2020-07-09 (×8): qty 237

## 2020-07-09 MED ORDER — FREE WATER: 100.0000 mL | Freq: Every day | 1 refills | Status: AC

## 2020-07-09 MED ORDER — POLYETHYLENE GLYCOL 3350 17 G PO PACK: 17.0000 g | PACK | Freq: Every day | ORAL | 0 refills | Status: AC | PRN

## 2020-07-09 MED ORDER — MULTAQ 400 MG PO TABS: 400.0000 mg | ORAL_TABLET | Freq: Two times a day (BID) | ORAL | 0 refills | Status: DC

## 2020-07-09 MED ORDER — OXYCODONE HCL 10 MG PO TABS: 10.0000 mg | ORAL_TABLET | Freq: Four times a day (QID) | ORAL | 0 refills | Status: AC | PRN

## 2020-07-09 MED ORDER — ACETAMINOPHEN 160 MG/5ML PO SOLN: 650.0000 mg | Freq: Four times a day (QID) | ORAL | 0 refills | Status: AC

## 2020-07-09 MED ORDER — APIXABAN 5 MG PO TABS: 5.0000 mg | ORAL_TABLET | Freq: Two times a day (BID) | ORAL | 1 refills | Status: DC

## 2020-07-09 MED ORDER — METOPROLOL TARTRATE 25 MG/10 ML ORAL SUSPENSION: 25.0000 mg | Freq: Two times a day (BID) | ORAL | 0 refills | Status: DC

## 2020-07-09 MED ORDER — OSMOLITE 1.5 CAL PO LIQD: 237.0000 mL | Freq: Every day | ORAL | 1 refills | Status: AC

## 2020-07-09 MED ORDER — THIAMINE HCL 100 MG PO TABS: 100.0000 mg | ORAL_TABLET | Freq: Every day | ORAL | 0 refills | Status: AC

## 2020-07-09 MED ORDER — FREE WATER
100.0000 mL | Freq: Every day | Status: DC
  Administered 2020-07-09 (×3): 100 mL

## 2020-07-09 NOTE — Care Management Important Message (Signed)
Important Message  Patient Details  Name: Donivin Wirt MRN: [id number] Date of Birth: 08-21-1941   Medicare Important Message Given:  Yes     Shelda Altes 07/09/2020, 10:35 AM

## 2020-07-09 NOTE — Progress Notes (Addendum)
Nutrition Follow-up  RD working remotely.  DOCUMENTATION CODES:   Severe malnutrition in context of chronic illness, Underweight   INTERVENTION:   -D/c Prosurce TF   -Adjust bolus feedings of 1 can (237 ml) Osmolite 1.5 via g-tube 6 times daily  50 ml free water flush before and after each feeding administration  Tube feeding regimen provides 2130 kcal (>100% of needs), 89 grams of protein, and 1086 ml of H2O. Total free water: 1686 ml daily  NUTRITION DIAGNOSIS:   Severe Malnutrition related to chronic illness as evidenced by severe fat depletion, severe muscle depletion.  Ongoing  GOAL:   Patient will meet greater than or equal to 90% of their needs  Met with TF  MONITOR:   TF tolerance, PO intake, Supplement acceptance, Weight trends, Labs, I & O's, Skin  REASON FOR ASSESSMENT:   Consult Assessment of nutrition requirement/status  ASSESSMENT:   79 year old man with a history of PUD, tobacco use, chronic A. fib, PAD, and HTN admitted for hyponatremia with plans for EGD for odynophagia.  7/6 G-tube placed 7/8 KUB shows dilated bowels which could be ileus, stool impaction  Reviewed I/O's: -1.6 L x 24 hours and +4.4 L since 06/25/20  UOP: 2.3 L x 24 hours  Palliative care following for goals of care discussions; plan for home with home health services and community based palliative services.   Pt is prescribed an oral diet for comfort, however, eating very little due to pain. He is receiving his nutrition via TF- 1 can Osmolite 1.5 via g-tube 5 times per day and 45 ml Prosurce TF BID. Regimen providing 1855 kcals, 97 grams protein, and 905 ml free water daily, meeting 100% of estimated kcal and protein needs.   Per TOC notes, plan to d/c home soon- possibly today. Per [place] Carolynn Sayers), request RD re-evaluate home TF regimen, as protein modular is typically not covered by insurance. Case also discussed with MD regarding K content in TF.  Medications  reviewed and include thiamine.   Labs reviewed: Na: 133, K: 5.4, CBGS: 63-122 (inpatient orders for glycemic control are none).   Diet Order:   Diet Order            DIET SOFT Room service appropriate? Yes; Fluid consistency: Thin  Diet effective now                 EDUCATION NEEDS:   Not appropriate for education at this time  Skin:  Skin Assessment: Skin Integrity Issues: Skin Integrity Issues:: Stage II, Other (Comment) Stage II: buttocks x2 Other: MASD- buttocks  Last BM:  07/08/20  Height:   Ht Readings from Last 1 Encounters:  06/21/20 5' 9" (1.753 m)    Weight:   Wt Readings from Last 1 Encounters:  07/08/20 44.4 kg    Ideal Body Weight:  72.7 kg  BMI:  Body mass index is 14.45 kg/m.  Estimated Nutritional Needs:   Kcal:  7048-8891  Protein:  85-95 grams  Fluid:  >/= 1.7 L/day    Loistine Chance, RD, LDN, [place] Registered Dietitian II Certified Diabetes Care and Education Specialist Please refer to [facility] for RD and/or RD on-call/weekend/after hours pager

## 2020-07-09 NOTE — Progress Notes (Signed)
2L 99%  1L 99% RA 92%

## 2020-07-09 NOTE — Progress Notes (Addendum)
2050: Discharge instruction was given to patient's son including medications and tube feeding. Questions are answered regarding medications. Gorden Harms (pt's son) verbalized understanding. PIV removed. Patient was about to discharge via a wheelchair.   2100: Son came back and reported that a pharmacy is closed. He couldn't get night time meds. Patient is on pain at this time. A charge nurse notified. A patient didn't leave a hospital yet and armband is still on. A patient came back to his room. RN will administer night time medications and discharge him later tonight.   2310: Son came back. patient is discharged

## 2020-07-09 NOTE — Discharge Summary (Signed)
Name: Daniel [name] MRN: [id number] DOB: 1941/02/12 79 y.o. PCP: [name], MD  Date of Admission: 06/19/2020  5:13 PM Date of Discharge:  Attending Physician: Sid Falcon, MD  Discharge Diagnosis: 1. Esophageal SCC 2. Malnutrition 3. Refeeding syndrome 4. Klebsiella bacteremia  5. 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   Flomax [tamsulosin Hcl]    Dizzy      Medication List    STOP taking these medications   atorvastatin 20 MG tablet Commonly known as: LIPITOR   dabigatran 75 MG Caps capsule Commonly known as: Pradaxa   enalapril 10 MG tablet Commonly known as: VASOTEC   metoprolol succinate 50 MG 24 hr tablet Commonly known as: TOPROL-XL   TYLENOL PO Replaced by: acetaminophen 160 MG/5ML solution   vitamin B-12 1000 MCG tablet Commonly known as: CYANOCOBALAMIN     TAKE these medications   acetaminophen 160 MG/5ML solution Commonly known as: TYLENOL Place 20.3 mLs (650 mg total) into feeding tube every 6 (six) hours. Replaces: TYLENOL PO   apixaban 5 MG Tabs tablet Commonly known as: ELIQUIS Place 1 tablet (5 mg total) into feeding tube 2 (two) times daily.   feeding supplement (OSMOLITE 1.5 CAL) Liqd Place 237 mLs into feeding tube 6 (six) times daily.   free water Soln Place 100 mLs into feeding tube 6 (six) times daily.   Lokelma 5 g packet Generic drug: sodium zirconium cyclosilicate Place 5 g into feeding tube daily for 7 days.   metoprolol tartrate 25 mg/10 mL Susp Commonly known as: LOPRESSOR Place 10 mLs (25 mg total) into feeding tube 2 (two) times daily.   Multaq 400 MG tablet Generic drug: dronedarone Place 1 tablet (400 mg total) into feeding tube 2 (two) times daily with a meal. What changed:   how much to take  how to take this   Oxycodone HCl 10 MG Tabs Take 1 tablet (10 mg total) by mouth  every 6 (six) hours as needed for moderate pain or severe pain.   polyethylene glycol 17 g packet Commonly known as: MIRALAX / GLYCOLAX Place 17 g into feeding tube daily as needed for mild constipation.   thiamine 100 MG tablet Place 1 tablet (100 mg total) into feeding tube daily. Start taking on: July 10, 2020            Durable Medical Equipment  (From admission, onward)         Start     Ordered   07/09/20 1509  For home use only DME Bedside commode  Once       Question:  Patient needs a bedside commode to treat with the following condition  Answer:  Weakness   07/09/20 1508   07/09/20 1508  For home use only DME Walker rolling  Once       Question Answer Comment  Walker: With 5 Inch Wheels   Patient needs a walker to treat with the following condition Weakness      07/09/20 1507   07/08/20 1546  For home use only DME Hospital bed  Once       Question Answer Comment  Length of Need Lifetime   Patient has (list medical condition): CHF   The above medical condition requires: Patient requires the ability to reposition frequently   Head must be elevated greater than: 30 degrees   Bed type Semi-electric  Trapeze Bar Yes   Support Surface: Gel Overlay      07/08/20 1546   06/27/20 1627  For home use only DME Tube feeding  Once       Comments: Osmolite 1.5 cal formula  bolus tube feeds   237 ml (1 carton/ARC) given 5 times daily per G-tube. Bolus feeds to provide 1778 kcal, 74 grams of protein, and 901 ml free water.    Patient will need tube feeds greater than 90 days.   06/27/20 1628           Discharge Care Instructions  (From admission, onward)         Start     Ordered   07/09/20 0000  Discharge wound care:       Comments: Home health RN   07/09/20 1632          Disposition and follow-up:   Daniel [name] was discharged from [facility] in Stable condition.  At the hospital follow up visit please address:  1.  Esophageal  SCC - Pain management: schedule Tylenol 650 mg Q6h and Oxycodone 10 mg Q6h PRN. Patient was given 30 days supplies of Opioid. Please monitor pain level. Please be aware of any drug diversion activity.   Malnutrition - G-tube place. Please make sure if nutrition and water flushes are given correctly. - Please make sure all his medicines are given correctly through feeding tube  Hyperkalemia - Please check BMP and trend potassium - Causes of hyperkalemia likely 2/2 Osmolite. If continue to be elevated, please contact dietician to switch to renal nutrition.  - Patient is instructed to take Lokelma 5 g daily for 5 days.   2.  Labs / imaging needed at time of follow-up: BMP  3.  Pending labs/ test needing follow-up: NA  Follow-up Appointments:  Follow-up Information    [facility] Follow up.   Why: [place] information: [place] [id number] [phone]        [facility] Palliative Follow up.   Why: outpatient palliative services Contact information: [place] [facility] Follow up.   Why: hospital bed, bedside commode, rolling walker Contact information: [address] [place] [address] [phone]               Hospital Course by problem list: 1. Esophageal mass Daniel [name] is a 79 year old man with a history of PUD, tobacco use, chronic A. fib, PAD, and HTN admitted for hyponatremia incidentally discovered in anticipation of EGD for odynophagia. EGD showed a near obstructing, ulcerated mass in the midesophagus 22 cm from the incisors and extends 4-5 cm. Pathology result shows poorly differentiated SCC. The oncologist Dr. Arelia Sneddon will consider treatment once his nutritional status improves. GI recommended EUS outpatient, plan 08/08/2020. G- tube was placed for additional nutrition. Patient is discharged with home health and  outpatient palliative care.   Malnutrition Malnutrition status secondary to dysphagia and low solute intake. Pre-albumin of 13.8 on admission. G-tube was placed and started feeding with dietician instruction.One day after starting tube feed, patient has an episode of tachycardia, tremor and hypophosphatemia which could be related to refeeding syndrome. Constipation was also a major cause. Tube feed was held for 2 days for bowel rest. Thiamine was added to help reduce risk of refeeding syndrome. Nutrition was restarted slowly.   Klebsiella bacteremia  Blood  culture on 7/721 grew Klebsiella. This is likely from bacterial translocation secondary to stercoral colitis. Patient is afebrile. WBC 7.4. BP 119/65. Patient does not have clinical symptoms of gram negative bacteremia. Ceftriaxone was started for duration of 3 days and switched to Cefazolin.   Hyponatremia Patient present with hypotonic, euvolemic hyponatremia on admission. It is likely chronic and multifactorial with limited solute intake, but mildly elevated urine osmolality of 212 suggests some degree of inappropriate ADH activity. Hyponatremia resolved after fluid restriction and protein intake.   Anemia Iron study suggests iron deficiency anemia, likely secondary to the ulcerated esophageal mass. Feraheme was given. Hgb was stable.  Hypercalcemia Elevated ionized Calcium with inappropriately normal PTH and vitamin D level. Zometa was given  Paroxysmal a-fib Heart rate was controlled during this admission. Metoprolol and Dronedarone were continued. Anticoagulate with Pradaxa, which was later switched to Eliquis because of G-tube.    Discharge Vitals:   BP (!) 100/52 (BP Location: Right Arm)   Pulse 95   Temp (!) 97.3 F (36.3 C) (Oral)   Resp 16   Ht 5\' 9"  (1.753 m)   Wt 44.4 kg   SpO2 96%   BMI 14.45 kg/m   Pertinent Labs, Studies, and Procedures:  CBC  CBC Latest Ref Rng & Units 07/09/2020 07/08/2020 07/07/2020  WBC 4.0 -  10.5 K/uL 5.7 5.5 7.1  Hemoglobin 13.0 - 17.0 g/dL 8.3(L) 8.1(L) 8.6(L)  Hematocrit 39 - 52 % 25.9(L) 25.3(L) 27.3(L)  Platelets 150 - 400 K/uL 364 331 344   BMP Latest Ref Rng & Units 07/09/2020 07/09/2020 07/08/2020  Glucose 70 - 99 mg/dL 97 143(H) 96  BUN 8 - 23 mg/dL 22 22 23   Creatinine 0.61 - 1.24 mg/dL 0.99 1.03 1.03  BUN/Creat Ratio 10 - 24 - - -  Sodium 135 - 145 mmol/L 133(L) 133(L) 131(L)  Potassium 3.5 - 5.1 mmol/L 4.9 5.4(H) 6.1(H)  Chloride 98 - 111 mmol/L 105 105 103  CO2 22 - 32 mmol/L 22 24 22   Calcium 8.9 - 10.3 mg/dL 9.0 9.3 9.3   CT CHEST WO CONTRAST  Result Date: 06/20/2020 CLINICAL DATA:  Chest pain. Hyponatremia. EXAM: CT CHEST WITHOUT CONTRAST TECHNIQUE: Multidetector CT imaging of the chest was performed following the standard protocol without IV contrast. COMPARISON:  03/20/2013 FINDINGS: Cardiovascular: The heart is normal in size. Small amount of pericardial fluid but no overt effusion. The thoracic aorta is normal in caliber. Moderate atherosclerotic calcifications. Three-vessel coronary artery calcifications. Mediastinum/Nodes: No mediastinal or hilar mass or adenopathy. The esophagus is grossly normal. Lungs/Pleura: Advanced emphysematous changes and areas of pulmonary scarring. Stable appearing biapical pleural and parenchymal scarring. No worrisome pulmonary lesions. 10.5 mm smoothly marginated well circumscribed and slightly lobulated nodule is noted in left lower lobe on image 121/3. This was also present on the prior chest CT from 2014 where it measured a maximum of 8 mm. It demonstrates low attenuation and likely contains some macroscopic fat. 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 Upper Abdomen: No significant upper abdominal findings. Advanced vascular calcifications are noted. Scattered hepatic cysts. Musculoskeletal: No significant bony findings. IMPRESSION: 1. Advanced emphysematous changes and areas of pulmonary scarring. 2. No acute pulmonary  findings or worrisome pulmonary lesions. 3. 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 4. No mediastinal or hilar mass or adenopathy. 5. Aortic and Three-vessel coronary artery calcifications. 6. Emphysema and aortic atherosclerosis. Aortic Atherosclerosis (ICD10-I70.0) and Emphysema (ICD10-J43.9). Aortic Atherosclerosis (  ICD10-I70.0) and Emphysema (ICD10-J43.9). Electronically Signed   By: Marijo Sanes M.D.   On: 06/20/2020 09:45   Discharge Instructions: Discharge Instructions    Call MD for:  severe uncontrolled pain   Complete by: As directed    Diet - low sodium heart healthy   Complete by: As directed    Discharge instructions   Complete by: As directed    Daniel [name],  It was a pleasure taking care of you during this admission. Please follow up with the Internal Medicine clinic on July 11, 2020.  Please continue tube feed as instructed. Please place Tylenol 650 mg in feeding tube four times a day.  Please place Oxycodone 10 mg in feeding tube as needed as often at every 6 hour.  Place place Lokelma 5 g in feeding tube once a day Please continue other medications as instructed.  Take care, Daniel Gerold, DO   Discharge wound care:   Complete by: As directed    Home health RN   Increase activity slowly   Complete by: As directed       Signed: [name] Gerold, DO 07/09/2020, 4:39 PM   Pager: [phone]

## 2020-07-09 NOTE — Progress Notes (Signed)
PT Cancellation Note  Patient Details Name: Daniel [name] MRN: [id number] DOB: 1941-07-10   Cancelled Treatment:    Reason Eval/Treat Not Completed: (P) Patient declined, no reason specified Pt reports he does not feel like therapy today. Asked pt how he will get around at home and pt reports he will have his son carry him. Discussed that might not be a safe option. Suggested possible transfer training with son. Pt noncommittal.   Dani Gobble. Migdalia Dk PT, DPT Acute Rehabilitation Services Pager [phone] Office [phone]  [place] 07/09/2020, 1:20 PM

## 2020-07-09 NOTE — TOC Transition Note (Addendum)
Transition of Care [facility]) - CM/SW Discharge Note   Patient Details  Name: Daniel [name] MRN: [id number] Date of Birth: 1941-06-15  [facility] CM/SW Contact:  Zenon Mayo, RN Phone Number: 07/09/2020, 3:31 PM   Clinical Narrative:    Patient is for dc today, he is set up with [facility] for [facility], [place].  [facility] will be delivering hospital bed to home today, which the bed is on the way to his home,last time this NCM checked.  Also he will be on bolus feeds, Pam Tamera Punt is supplying the bolus feeds and she has done teaching with patient and the family.  Staff RN states she is checking to see if patient will need home oxygen. Son will transport patient home by car. [employer] will also be delivering the bsc and the rolling walker to patient's room. He does not need oxygen per Staff RN Clarise Cruz.   Final next level of care: [place] Barriers to Discharge: No Barriers Identified   Patient Goals and CMS Choice Patient states their goals for this hospitalization and ongoing recovery are:: get better CMS Medicare.gov Compare Post Acute Care list provided to:: Patient Represenative (must comment) Choice offered to / list presented to : Adult Children  Discharge Placement                       Discharge Plan and Services   Discharge Planning Services: CM Consult Post Acute Care Choice: Home Health          DME Arranged: [place] Hospital bed, Bedside commode DME Agency: [facility] Date DME Agency Contacted: 07/09/20 Time DME Agency Contacted: [id number] Representative spoke with at DME Agency: [place]: RN, PT [facility] Agency: [place] Date [address]: 07/09/20 Time [place]: [place] Representative spoke with at [place]: [place] Interventions     Readmission Risk Interventions No flowsheet data found.

## 2020-07-09 NOTE — Progress Notes (Addendum)
Subjective:   Hospital day: 17  Overnight event: Meeting with family yesterday to discuss long-term goal for Daniel [name]. His son Daniel [name] and his wife Daniel [name] were in the room with the presence of Daniel [name] of the palliative care. After extensive conversations and educations about his medical condition, pain management, and tube feed, Daniel [name] has decided to go home with home health PT and outpatient palliative care. He has good family support at home including his son, his wife, daughter and grandson. Changed his pain regimen into scheduled tylenol and Oxycodone PRN q6h. Please see's Daniel [name] note for more detail.  Daniel [name] was seen at bedside today. He appears more alert and awake, likely because dilaudid was discontinued. He denies any pain and reports doing well. Discussed with him that we are working on transition of care to home health. He voices understand.   Objective:  Vital signs in last 24 hours: Vitals:   07/08/20 1956 07/09/20 0006 07/09/20 0514 07/09/20 0752  BP: (!) 100/55 (!) 114/56 (!) 122/56 (!) 103/59  Pulse: 68 81 60 98  Resp: 17 17 18 16   Temp: (!) 97.5 F (36.4 C) (!) 97.3 F (36.3 C) (!) 97.4 F (36.3 C) 98.1 F (36.7 C)  TempSrc: Oral Oral Oral Oral  SpO2: 97% 100% 100% 100%  Weight:      Height:        Physical Exam  Physical Exam Constitutional:      General: He is not in acute distress.    Appearance: Normal appearance. He is not toxic-appearing.  HENT:     Head: Normocephalic.  Eyes:     General: No scleral icterus.    Conjunctiva/sclera: Conjunctivae normal.  Cardiovascular:     Rate and Rhythm: Normal rate and regular rhythm.     Heart sounds: Normal heart sounds.  Pulmonary:     Effort: Pulmonary effort is normal. No respiratory distress.  Abdominal:     General: Abdomen is flat. Bowel sounds are normal. There is no distension.     Comments: abdominal bruit  Musculoskeletal:     Cervical back: Normal range of motion.     Right lower leg:  No edema.     Left lower leg: No edema.  Skin:    General: Skin is warm.  Neurological:     Mental Status: He is alert.  Psychiatric:        Mood and Affect: Mood normal.     Assessment/Plan: Daniel [name] a 79 year old man with a history of PUD, tobacco use, chronic A. fib, PAD, and HTN admitted for hyponatremia incidentally discovered in anticipation of EGD for odynophagia.EGD showed a mid-esophageal massconsistent with poorly differentiated SCC.Long term plan:go home with home health PT and outpatient palliative.  Principal Problem:   Hyponatremia Active Problems:   Atrial fibrillation status post cardioversion (HCC)   Lung nodule   Protein-calorie malnutrition, severe (HCC)   Odynophagia   Goals of care, counseling/discussion   Esophageal cancer, stage IIB (HCC)   Pressure injury of skin   Palliative care by specialist   DNR (do not resuscitate)   Bacteremia due to Klebsiella pneumoniae   Pain   Pain of metastatic malignancy   Refeeding syndrome, malnutrition Malnutrition due to dysphagia and low solute intake.Pre-albumin of 13.8.G-tube placed, nutrition held forileus, now resumed.Continue bolus Tube feed and monitor for refeeding syndrome. -Dietician consulted.Consolidate tube feed into boluses:1 can/ARC of Osmolite 1.5237 mLsix times daily.100 ml free water flushes before and after each feeding. Monitor for refeeding syndrome.Patient  tolerates the feed well. - Continue to monitor for BMPdaily. -Continue thiamine 100 mgdaily - PPI prophylaxis - Continue on telemetry    Hyperkalemia PM potasium 6.1. Recheck Potasium in AM 5.4. Lokelma 10g was given. Continue boluses feed with Free water flushes. - Recheck BMP in AM - Patient may need to take Lokelma at home.    Esophageal SCC EGD showed acircumferential, near obstructing, ulcerated mass in the midesophagus 22 cm from the incisors and extends 4-5 cm. CT head, abdomen and pelvis shows  no metastatic findings. 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 Domingo [name] that patient is not a candidate for intervention at the moment given his nutritional status.Planned to start treatment when improved nutritional and functional status. - GI recommend EUS outpatient procedure. Plan 08/08/20. - Pain management: Tylenol 650 mg Q6h and Oxycodone 10 mg Q6h PRN with Oxy 10 mg Q4h PRN for breakthrough pain. -Patient will go home with home health PT and outpatient palliative. His son is set up with [facility] for palliative services outpatient. Family also is requesting a medical bed.    CHF exacerbation Patient has history of tachycardia-mediated cardiomyopathy which he was seeing [facility] in 2018. Echo 05/31/2017 shows EF 20% when he was having a-fib with RVR. Repeat Echo 9/12 shows normal EF 55-60%. CXR shows pulmonary edema and bilateral pleural effusions, which consistent with CHF.  - Patient appears euvolemic on exam. LE edema resolve - Monitor I/O and daily weight.    Stool impaction Stercoral colitis Patient is on Opiods for pain, which can worsen his constipation.He is now having regular bowel movements. Continue toholdbowel regimenofbisacodyl, Senokot and MiraLAX. Will restart if patient stops having BM.   Normocytic anemia Iron studies revealed ferritin 131, TIBC normal at 272, iron saturation 5, iron 13.Vitamin D22, folic acid unremarkable. Combination of iron deficiency anemia and inflammation, secondary to ulcerated esophageal mass. - Feraheme 510 mg IVgiven7/6/21and 07/05/20 - Continue CBC everyday.  -Continue Eliquis for a-fib andDVT prophylaxis. The benefit of DVT prophylaxis outweighs the bleeding risk. -Transfuse if Hgb < 7   Paroxysmal a-fib  - Heart rate within normal limit - Continue Metoprololand Dronedaronethrough tube -Continue Eliquis   Diet:Tube feeds Fluid:no GUR:KYHCWCB Code: DNR  Prior  to Admission Living Arrangement:home Anticipated Discharge Location:home with home health and outpatient palliative Barriers to Discharge:home health setup Dispo: Anticipated discharge in approximately1day(s).  [name] Gerold, DO 07/09/2020, 8:36 AM Pager: [phone] After 5pm on weekdays and 1pm on weekends: On Call pager [phone]

## 2020-07-10 ENCOUNTER — Telehealth: Payer: Self-pay | Admitting: *Deleted

## 2020-07-10 NOTE — Telephone Encounter (Signed)
Verbal Josem Kaufmann given to Rory Percy, RN with [facility] for [facility] RN 1 week 6 to work on meds, g-tube, safety measures, and disease process. Will route to PCP for agreement/denial.  [facility] PT will also go out to evaluate for strengthening/endurance. Hubbard Hartshorn, BSN, RN-BC

## 2020-07-10 NOTE — Telephone Encounter (Addendum)
Information for PA for Oxycodone was sent through [employer]. Awaiting determination.  Sander Nephew, RN 07/10/2020 9:41 AM.   PA for Oxycodone 10 mg approved 04/11/2020 thru 07/10/2021.  Sander Nephew, RN  07/12/2020  4:04 PM.

## 2020-07-11 ENCOUNTER — Ambulatory Visit: Payer: Self-pay | Admitting: *Deleted

## 2020-07-11 ENCOUNTER — Ambulatory Visit: Payer: Medicare Other

## 2020-07-11 ENCOUNTER — Telehealth: Payer: Self-pay | Admitting: *Deleted

## 2020-07-11 ENCOUNTER — Encounter: Payer: Medicare Other | Admitting: Internal Medicine

## 2020-07-11 NOTE — Telephone Encounter (Signed)
I agree

## 2020-07-11 NOTE — Progress Notes (Deleted)
bmp 

## 2020-07-11 NOTE — Telephone Encounter (Signed)
Palliative Care SW made TC to patents son, Doomes, to schedule initial in home visit. SW LVM. awaiting return call.

## 2020-07-11 NOTE — Chronic Care Management (AMB) (Signed)
  Chronic Care Management   Note  07/11/2020 Name: Daniel [name] MRN: [id number] DOB: 1941-07-11  Received referral for chronic care management from hospital liaison [place]. Face to face assessment was scheduled for today during post hospital discharge clinic visit however patient did not keep appointment. Patient was hospitalized from 6/30-06/2020 for  Difficulty swallowing related to squamous cell esophageal cancer. A feeding tube was placed and he was seen by oncology and palliative care while hosptialized  Follow up plan: The care management team will reach out to the patient again over the next 7 days.   Kelli Churn RN, CCM, [facility] RN Care Manager [phone]

## 2020-07-11 NOTE — Telephone Encounter (Signed)
VO given to proceed with pt having palliative care services. Do you agree?

## 2020-07-12 ENCOUNTER — Telehealth: Payer: Self-pay | Admitting: Student in an Organized Health Care Education/Training Program

## 2020-07-12 NOTE — Telephone Encounter (Signed)
Palliative care SW LVM for patients son, per referral, to scheudle initial visit. Awaiting return call.

## 2020-07-12 NOTE — Telephone Encounter (Signed)
PER LAURA PT REFUSE SERVICES FOR TODAY, WILL MOVE TO NEXT WEEK [phone]

## 2020-07-13 ENCOUNTER — Telehealth: Payer: Self-pay | Admitting: Internal Medicine

## 2020-07-13 NOTE — Telephone Encounter (Signed)
   Reason for call:   I received a call from Mr. Kagen Kunath son at 8:30 AM indicating that patient has been acting more confused since last night.   Pertinent Data:   This is a 79 year old male with a history of PUD, tobacco use, chronic a fib, PAD, and HTN who was recently admitted from 6/30-7/20 where he was noted to have esophageal mass, klebsiella bacteremia, and electrolyte abnormalities.   Son reports that patient took his lopressor this morning and ever since then he reports that patient has been very confused and itching all over his body. He was seen talking to himself, seeing things that weren't there at home, going to the living room and sitting down and trying to use the bathroom in the living room. Son reports that it started last night but that it's been worsening. Patient reported having some headache but denied any other symptoms. Son reports he feels a little warm. He has been eating okay, getting tube feeds right now. Had some confusion at the hospital however was doing well the few days before discharge. Taking oxycodone every 6 hours.    Assessment / Plan / Recommendations:   This is a 79 year old male with a history of PUD, tobacco use, chronic a fib, PAD, and HTN who was recently admitted from 6/30-7/20 where he was noted to have esophageal mass, klebsiella bacteremia, and electrolyte abnormalities. Son is calling for acute encephalopathy and worsening confusion. Unclear etiology at this time. Recommended that patient should be brought into the ER to be evaluated.   As always, pt is advised that if symptoms worsen or new symptoms arise, they should go to an urgent care facility or to to ER for further evaluation.   Asencion Noble, MD   07/13/2020, 8:41 AM

## 2020-07-15 NOTE — Telephone Encounter (Signed)
Agree with all. Thank you.

## 2020-07-15 NOTE — Telephone Encounter (Signed)
Palliative care SW LVM for patient to scheudled initial in home visit.

## 2020-07-17 ENCOUNTER — Ambulatory Visit: Payer: Medicare Other | Admitting: *Deleted

## 2020-07-17 ENCOUNTER — Telehealth: Payer: Self-pay | Admitting: *Deleted

## 2020-07-17 DIAGNOSIS — I10 Essential (primary) hypertension: Secondary | ICD-10-CM

## 2020-07-17 DIAGNOSIS — I4891 Unspecified atrial fibrillation: Secondary | ICD-10-CM

## 2020-07-17 DIAGNOSIS — C159 Malignant neoplasm of esophagus, unspecified: Secondary | ICD-10-CM

## 2020-07-17 NOTE — Telephone Encounter (Signed)
Call from Donalynn Furlong PT with [facility]. Requesting orders for "PT twice a week x 1 week then once a week x 2 weeks". VO given - if inappropriate, let me know Thanks

## 2020-07-17 NOTE — Telephone Encounter (Signed)
I agree. Thank you.

## 2020-07-17 NOTE — Chronic Care Management (AMB) (Signed)
  Chronic Care Management   Note  07/17/2020 Name: Daniel [name] MRN: [id number] DOB: 1941-01-14  Patient referred to Chronic Care Management program on 07/10/20 by [facility] hospital liaison [place]. Successful outreach to patient,  explained chronic care management program through the [facility] Center and role of CCM RN and CCM BSW. Patient states he has a HHRN, HHPT and a Palliative Care RN seeing him and he does not think he would benefit from CCM services at this time.  Follow up plan: No further follow up required: as patient is declining CCM services.  Kelli Churn RN, CCM, [facility] RN Care Manager [phone]

## 2020-07-18 ENCOUNTER — Telehealth: Payer: Self-pay

## 2020-07-18 NOTE — Progress Notes (Signed)
Internal Medicine Clinic Resident  I have personally reviewed this encounter including the documentation in this note and/or discussed this patient with the care management provider. I will address any urgent items identified by the care management provider and will communicate my actions to the patient's PCP. I have reviewed the patient's CCM visit with my supervising attending, Dr Evette Doffing.  Jeralyn Bennett, MD 07/18/2020

## 2020-07-18 NOTE — Telephone Encounter (Signed)
Palliative care SW LVM for patient/son to schedule initial in home visit. Awaiting retunr call.

## 2020-07-18 NOTE — Progress Notes (Signed)
Internal Medicine Clinic Attending  CCM services provided by the care management provider and their documentation were discussed with Dr. Konrad Penta. We reviewed the pertinent findings, urgent action items addressed by the resident and non-urgent items to be addressed by the PCP.  I agree with the assessment, diagnosis, and plan of care documented in the CCM and resident's note.  [name], MD 07/18/2020

## 2020-07-18 NOTE — Telephone Encounter (Signed)
Palliative care SW LVM for patient/son to schedule initial in home visit. Unable to LVM on this # due to VM not being set up.

## 2020-07-19 ENCOUNTER — Encounter: Payer: Self-pay | Admitting: *Deleted

## 2020-07-19 ENCOUNTER — Telehealth: Payer: Self-pay | Admitting: Student in an Organized Health Care Education/Training Program

## 2020-07-19 NOTE — Telephone Encounter (Signed)
Completed Letter.

## 2020-07-19 NOTE — Telephone Encounter (Signed)
Returned call to [place] with [facility]. States she received a referral for palliative care services on this patient but has not been able to get in touch with him or son. Has made 2 calls to patient and there is no answer and no VM set up. She has left 3 messages on son's VM with no return call. Verified numbers are correct. She will continue to try today and Monday but will then have to close out referral if there is no response. Will send letter to patient's son with [place]'s contact info. Hubbard Hartshorn, BSN, RN-BC

## 2020-07-19 NOTE — Telephone Encounter (Signed)
Pls contact [place] regarding patient  [phone]

## 2020-08-05 ENCOUNTER — Other Ambulatory Visit (HOSPITAL_COMMUNITY): Payer: Medicare Other

## 2020-08-06 ENCOUNTER — Other Ambulatory Visit (HOSPITAL_COMMUNITY)
Admission: RE | Admit: 2020-08-06 | Discharge: 2020-08-06 | Disposition: A | Discharge status: Home / Self Care | Payer: Medicare Other | Source: Ambulatory Visit | Attending: Gastroenterology | Admitting: Gastroenterology

## 2020-08-06 DIAGNOSIS — Z20822 Contact with and (suspected) exposure to covid-19: Secondary | ICD-10-CM | POA: Diagnosis not present

## 2020-08-06 DIAGNOSIS — Z01812 Encounter for preprocedural laboratory examination: Secondary | ICD-10-CM | POA: Insufficient documentation

## 2020-08-06 LAB — SARS CORONAVIRUS 2 (TAT 6-24 HRS): SARS Coronavirus 2: NEGATIVE

## 2020-08-07 ENCOUNTER — Encounter (HOSPITAL_COMMUNITY): Payer: Self-pay | Admitting: Anesthesiology

## 2020-08-07 ENCOUNTER — Telehealth: Payer: Self-pay | Admitting: Gastroenterology

## 2020-08-07 NOTE — Telephone Encounter (Signed)
Patient has hospital procedure scheduled with Dr. Ardis Hughs tomorrow, [facility] is calling stating that the patient did not stop hes Eliquis and that they were not aware to stop. they are asking if he was to stop she couldn't find anything in system regarding BT.

## 2020-08-07 NOTE — Telephone Encounter (Signed)
Dr Ardis Hughs the pt has an EUS scheduled for tomorrow and was put on Eliquis after we made the appt and he did not call our office to make [place] aware so he has not stopped it.  How do you want to proceed?

## 2020-08-07 NOTE — Anesthesia Preprocedure Evaluation (Deleted)
Anesthesia Evaluation    Reviewed: Allergy & Precautions, Patient's Chart, lab work & pertinent test results  Airway        Dental  (+) Teeth Intact   Pulmonary Current Smoker and Patient abstained from smoking.,           Cardiovascular hypertension, Pt. on medications      Neuro/Psych negative neurological ROS  negative psych ROS   GI/Hepatic Neg liver ROS, PUD,   Endo/Other  Hyperthyroidism   Renal/GU negative Renal ROS     Musculoskeletal  (+) Arthritis ,   Abdominal   Peds  Hematology   Anesthesia Other Findings   Reproductive/Obstetrics                             Anesthesia Physical Anesthesia Plan  ASA: III  Anesthesia Plan: MAC   Post-op Pain Management:    Induction: Intravenous  PONV Risk Score and Plan: 3 and Treatment may vary due to age or medical condition, Ondansetron and Dexamethasone  Airway Management Planned: Natural Airway  Additional Equipment: None  Intra-op Plan:   Post-operative Plan:   Informed Consent:     Dental advisory given  Plan Discussed with: CRNA  Anesthesia Plan Comments: (Upper endoscopy for Ca Staging)        Anesthesia Quick Evaluation

## 2020-08-08 ENCOUNTER — Ambulatory Visit (HOSPITAL_COMMUNITY): Admission: RE | Admit: 2020-08-08 | Payer: Medicare Other | Source: Ambulatory Visit | Admitting: Gastroenterology

## 2020-08-08 ENCOUNTER — Encounter (HOSPITAL_COMMUNITY): Admission: RE | Payer: Self-pay | Source: Ambulatory Visit

## 2020-08-08 SURGERY — UPPER ENDOSCOPIC ULTRASOUND (EUS) RADIAL
Anesthesia: Monitor Anesthesia Care

## 2020-08-08 NOTE — Telephone Encounter (Signed)
It should still be safe to proceed with EUS today.  I called his home number and his son's mobile number this AM to discuss but no answers. Thanks

## 2020-08-09 ENCOUNTER — Other Ambulatory Visit: Payer: Self-pay | Admitting: Student

## 2020-08-20 ENCOUNTER — Other Ambulatory Visit: Payer: Self-pay | Admitting: Student in an Organized Health Care Education/Training Program

## 2020-08-20 NOTE — Telephone Encounter (Signed)
RTC, vmail not set up

## 2020-08-20 NOTE — Telephone Encounter (Signed)
Pt requesting a call back about having a pain medication called in.  Please call patient back.

## 2020-08-21 ENCOUNTER — Telehealth: Payer: Self-pay | Admitting: *Deleted

## 2020-08-21 MED ORDER — OXYCODONE HCL 10 MG PO TABS
10.0000 mg | ORAL_TABLET | Freq: Four times a day (QID) | ORAL | 0 refills | Status: DC | PRN
Start: 2020-08-21 — End: 2020-09-02

## 2020-08-21 NOTE — Telephone Encounter (Signed)
Cancer related pain treated with oxycodone 10mg  3-4 times daily. Will refill this now and he can follow up with [place] in two weeks.

## 2020-08-21 NOTE — Telephone Encounter (Signed)
Patient called back. States he was d/c from [facility] on 07/10/2020 with oxycodone. Still having same right-sided abdominal pain but is out of med. Requesting refill. Has appt with PCP on 09/02/2020. Oxycodone no longer on current med list.

## 2020-08-21 NOTE — Telephone Encounter (Signed)
pharmacy called for diag code for oxy script. Gave 2 r/t osteoarthritis and cancer

## 2020-08-22 NOTE — Addendum Note (Signed)
Addended by: Hulan Fray on: 08/22/2020 06:32 PM   Modules accepted: Orders

## 2020-09-02 ENCOUNTER — Encounter: Payer: Self-pay | Admitting: Student in an Organized Health Care Education/Training Program

## 2020-09-02 ENCOUNTER — Other Ambulatory Visit: Payer: Self-pay

## 2020-09-02 ENCOUNTER — Ambulatory Visit (INDEPENDENT_AMBULATORY_CARE_PROVIDER_SITE_OTHER): Payer: Medicare Other | Admitting: Student in an Organized Health Care Education/Training Program

## 2020-09-02 VITALS — BP 134/76 | HR 100 | Temp 97.9°F | Ht 69.0 in | Wt 100.2 lb

## 2020-09-02 DIAGNOSIS — C159 Malignant neoplasm of esophagus, unspecified: Secondary | ICD-10-CM

## 2020-09-02 MED ORDER — OXYCODONE-ACETAMINOPHEN 10-325 MG PO TABS: 1.0000 | ORAL_TABLET | Freq: Four times a day (QID) | ORAL | 0 refills | Status: DC | PRN

## 2020-09-02 NOTE — Assessment & Plan Note (Addendum)
Recent diagnosis of a poorly differentiated esophageal squamous cell carcinoma in the mid esophagus associated with significant dysphagia.  Since discharge she has been making some steady improvements at home with his nutrition.  He is tolerating G-tube feeds well, he does bolus feeding at least 4 times daily.  He is able to take plenty of water by mouth, and tolerates his oral medications.  He is eager to find out the next steps in treatment.  Plan is to follow-up with Dr. Ardis Hughs to go for a repeat endoscopy with endoscopic ultrasound.  It seems that this procedure was delayed/canceled earlier in the month due to some confusion around his anticoagulant.  Were also can help him arrange follow-up with Dr. Marin Olp at the cancer center.  Think he will need a PET scan at some point to complete staging, and then discuss options for radiation therapy, chemotherapy or immunotherapy.  We will check labs today including a CMP, magnesium, phosphorus, and CBC.  I think his nutrition is improving, will see where his albumin is today.  He is having a significant amount of pain in his mid chest associated with his cancer that is currently not well controlled with oxycodone 10 mg 4 times daily.  He felt like the Percocet formulation was more beneficial to him when he used in the hospital.  I have prescribed him Percocet 10-325 mg to be used every 6 hours as needed for pain.  This is a chronic cancer related pain generator, so have prescribed a full 1 month supply.  He is also being cared for with palliative care services through [facility] at home.

## 2020-09-02 NOTE — Progress Notes (Signed)
   Assessment and Plan:  See Encounters tab for problem-based medical decision making.   __________________________________________________________  HPI:   79 year old person here for follow-up of esophageal carcinoma causing dysphagia and malnutrition.  He was hospitalized in early July for dysphagia that was so severe it caused him dehydration and hyponatremia.  Endoscopy showed a large esophageal mass in his mid esophagus.  Biopsies of that showed a poorly differentiated squamous cell carcinoma.  A gastric tube was placed as he was unable to adequately swallow solid foods enough to keep his nutrition up.  He has been using Osmolite cans at least 4 times daily at home with good benefit.  He reports independence in all his activities of daily living.  Says he has improved energy.  He still takes free water by mouth, and is able to take oral medications by mouth with some difficulty.  Denies any recent falls, no lightheadedness, no dizziness.  He reports that his pain in his mid chest and lower neck is not well controlled with oxycodone 10 mg 4 times daily.  Reports that the medications in the hospital were more effective.  __________________________________________________________  Problem List: Patient Active Problem List   Diagnosis Date Noted  . Esophageal cancer, stage IIB ([place]) 06/21/2020    Priority: High  . Atrial fibrillation status post cardioversion [facility]) 06/05/2010    Priority: High  . Protein-calorie malnutrition, severe ([place]) 09/30/2018    Priority: Medium  . Peripheral artery disease ([place]) 08/13/2011    Priority: Medium  . Hyperlipidemia 06/21/2007    Priority: Medium  . Essential hypertension 05/25/2007    Priority: Medium  . Tobacco abuse 12/08/2006    Priority: Medium  . Vitamin B12 deficiency 09/13/2015    Priority: Low  . Healthcare maintenance 03/03/2013    Priority: Low  . 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 Benign prostatic  hyperplasia 03/03/2012    Priority: Low  . Lung nodule 11/06/2010    Priority: Low  . Osteoarthritis of thoracolumbar spine 12/08/2006    Priority: Low  . DNR (do not resuscitate)   . Dysphagia 04/15/2020    Medications: Reconciled today in Epic __________________________________________________________  Physical Exam:  Vital Signs: Vitals:   09/02/20 1113  BP: 134/76  Pulse: 100  Temp: 97.9 F (36.6 C)  SpO2: 100%  Weight: 100 lb 3.2 oz (45.5 kg)  Height: 5\' 9"  (1.753 m)    Gen: Thin, fragile appearing, older man, no distress Neck: No cervical LAD, No thyromegaly or nodules, No JVD. CV: RRR, no murmurs Pulm: Normal effort, CTA throughout, no wheezing Abd: Soft, NT, ND, G-tube in place in the left upper quadrant, skin around it is clean, G-tube is well cared for Ext: Warm, no edema, normal joints

## 2020-09-03 ENCOUNTER — Other Ambulatory Visit: Payer: Self-pay | Admitting: Student in an Organized Health Care Education/Training Program

## 2020-09-03 ENCOUNTER — Telehealth: Payer: Self-pay | Admitting: Student in an Organized Health Care Education/Training Program

## 2020-09-03 LAB — CBC
Hematocrit: 33.2 % — ABNORMAL LOW (ref 37.5–51.0)
Hemoglobin: 11.1 g/dL — ABNORMAL LOW (ref 13.0–17.7)
MCH: 34.8 pg — ABNORMAL HIGH (ref 26.6–33.0)
MCHC: 33.4 g/dL (ref 31.5–35.7)
MCV: 104 fL — ABNORMAL HIGH (ref 79–97)
Platelets: 338 10*3/uL (ref 150–450)
RBC: 3.19 x10E6/uL — ABNORMAL LOW (ref 4.14–5.80)
RDW: 11.9 % (ref 11.6–15.4)
WBC: 8.4 10*3/uL (ref 3.4–10.8)

## 2020-09-03 LAB — PHOSPHORUS: Phosphorus: 3.5 mg/dL (ref 2.8–4.1)

## 2020-09-03 LAB — CMP14 + ANION GAP
ALT: 8 IU/L (ref 0–44)
AST: 17 IU/L (ref 0–40)
Albumin/Globulin Ratio: 1.6 (ref 1.2–2.2)
Albumin: 4.2 g/dL (ref 3.7–4.7)
Alkaline Phosphatase: 77 IU/L (ref 44–121)
Anion Gap: 15 mmol/L (ref 10.0–18.0)
BUN/Creatinine Ratio: 24 (ref 10–24)
BUN: 24 mg/dL (ref 8–27)
Bilirubin Total: 0.2 mg/dL (ref 0.0–1.2)
CO2: 22 mmol/L (ref 20–29)
Calcium: 11.2 mg/dL — ABNORMAL HIGH (ref 8.6–10.2)
Chloride: 96 mmol/L (ref 96–106)
Creatinine, Ser: 1.01 mg/dL (ref 0.76–1.27)
GFR calc Af Amer: 81 mL/min/{1.73_m2} (ref >59)
GFR calc non Af Amer: 70 mL/min/{1.73_m2} (ref >59)
Globulin, Total: 2.6 g/dL (ref 1.5–4.5)
Glucose: 95 mg/dL (ref 65–99)
Potassium: 5.8 mmol/L — ABNORMAL HIGH (ref 3.5–5.2)
Sodium: 133 mmol/L — ABNORMAL LOW (ref 134–144)
Total Protein: 6.8 g/dL (ref 6.0–8.5)

## 2020-09-03 LAB — MAGNESIUM: Magnesium: 2.3 mg/dL (ref 1.6–2.3)

## 2020-09-03 MED ORDER — LOKELMA 10 G PO PACK
10.0000 g | PACK | Freq: Every day | ORAL | 2 refills | Status: DC
Start: 2020-09-03 — End: 2020-11-02

## 2020-09-03 NOTE — Telephone Encounter (Signed)
Spoke with patient by phone regarding lab results from yesterday's visit. Noted that he has recurrent hyperkalemia and new hypercalcemia. Likely related to combination of CKD, malignancy, and the tube feedings. Will start Lokelma 10g packet by G tube once daily. Talked about administration, he understands. He has follow up appointment with gastroenterology on Thursday, would be nice to get repeat potassium at that time if able.

## 2020-09-05 ENCOUNTER — Ambulatory Visit (INDEPENDENT_AMBULATORY_CARE_PROVIDER_SITE_OTHER): Payer: Medicare Other | Admitting: Nurse Practitioner

## 2020-09-05 ENCOUNTER — Encounter: Payer: Self-pay | Admitting: Nurse Practitioner

## 2020-09-05 VITALS — BP 122/60 | HR 104 | Ht 69.0 in | Wt 100.4 lb

## 2020-09-05 DIAGNOSIS — C159 Malignant neoplasm of esophagus, unspecified: Secondary | ICD-10-CM | POA: Diagnosis not present

## 2020-09-05 NOTE — Patient Instructions (Signed)
If you are age 79 or older, your body mass index should be between 23-30. Your Body mass index is 14.82 kg/m. If this is out of the aforementioned range listed, please consider follow up with your Primary Care Provider.  If you are age 68 or younger, your body mass index should be between 19-25. Your Body mass index is 14.82 kg/m. If this is out of the aformentioned range listed, please consider follow up with your Primary Care Provider.   Our office will call you to schedule and EUS with Dr. Ardis Hughs, if you do not hear from [place] by Monday 09/09/2020 [phone].  Due to recent changes in healthcare laws, you may see the results of your imaging and laboratory studies on MyChart before your provider has had a chance to review them.  We understand that in some cases there may be results that are confusing or concerning to you. Not all laboratory results come back in the same time frame and the provider may be waiting for multiple results in order to interpret others.  Please give [place] 48 hours in order for your provider to thoroughly review all the results before contacting the office for clarification of your results.   Thank you for choosing [facility] Noralyn Pick, CRNP  [phone]

## 2020-09-05 NOTE — H&P (View-Only) (Signed)
09/05/2020 Daniel [name] [id number] March 08, 1941   Chief Complaint: Esophageal cancer, schedule an EUS  History of Present Illness: Daniel [name] is a 79 year old male with a past medical history of hypertension, hyperlipidemia, heart murmur, cardiomyopathy, atrial fibrillation s/p cardioversion 05/2010 on Eliquis, peripheral arterial disease s/p left common iliac artery stent, gastric ulcers 2007, s/p EUS in 2011 due to having a tiny submucosal lesion in the proximal stomach which was identified as a calcified cystic lesion on his spleen and tubulovillous colon polyp 2010.   He was initially evaluated in our office by Dr. [name] on 06/19/2020 due to having odynophagia. An EGD was ordered. Labs were done which showed profound hyponatremia (NA+ 119) and he was sent to the ED. He was admitted to [facility] on 06/20/2020 with odynophagia, weight loss and hyponatremia.  He underwent an EGD by Dr. Tarri Glenn on 06/21/2020 which identified a near obstructing malignant appearing esophageal tumor to the mid esophagus.  Mild gastritis and a medium size hiatal hernia was noted.  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  He was evaluated by oncologist Dr. Marin Olp during his hospital admission.  A head/chest/abdomen and pelvic CTs did not show any evidence of metastasis.  An esophageal endoscopic ultrasound to look at the surrounding lymph nodes to further stage his cancer was recommended.  Dr. Marin Olp also recommended radiation oncology evaluation.  A PEG tube was placed as he was significantly malnourished.  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 He had iron deficiency anemia and received Feraheme x1 infusion.  He was discharged home on 07/09/2020.  He was scheduled for an  EUS as an outpatient on 08/07/2020, however, the EUS was canceled by pretesting as he did not stop his Eliquis for 2 days prior to his procedure date. Dr. Ardis Hughs attempted to contact the patient to instruct him to proceed with the EUS on Eliquis as scheduled. The patient stated he did not receive this message therefore the EUS was not done.  Daniel [name] presents to our office today accompanied by his son Daniel [name] to reschedule an EUS.  He has not seen his oncologist Dr. Marin Olp since he was discharged from the hospital.  He continues to have chest and back pain for which he takes Percocet 10/325 1 tab every 4-6 hours.  He is coughing up clear phlegm with a few streaks of blood a few days ago. No frank hemoptysis. His PEG tube remains intact.  He is drinking three 8 ounce cans of Ensure and water daily.  He is instilling 4 cans of Osmolite 1.5 into the G-tube daily.  His weight was around 87 pounds during his hospitalization and his weight in our office today is 100 pounds.  He denies having any nausea or vomiting.  No abdominal pain.  He is passing a softly formed to loose brown bowel movement every other day.  No rectal bleeding or melena.  Labs 09/02/2020 showed a WBC 8.4.  Hemoglobin 11.1 up from 8.3.  Potassium 5.8.  Sodium 133.  Calcium 11.2.  LFTs were normal.  No other complaints today.  CBC Latest Ref Rng & Units 09/02/2020 07/09/2020 07/08/2020  WBC 3.4 - 10.8 x10E3/uL 8.4 5.7 5.5  Hemoglobin 13.0 - 17.7 g/dL 11.1(L) 8.3(L) 8.1(L)  Hematocrit 37.5 - 51.0 % 33.2(L) 25.9(L) 25.3(L)  Platelets 150 - 450 x10E3/uL 338 364  331    CMP Latest Ref Rng & Units 09/02/2020 07/09/2020 07/09/2020  Glucose 65 - 99 mg/dL 95 97 143(H)  BUN 8 - 27 mg/dL 24 22 22   Creatinine 0.76 - 1.27 mg/dL 1.01 0.99 1.03  Sodium 134 - 144 mmol/L 133(L) 133(L) 133(L)  Potassium 3.5 - 5.2 mmol/L 5.8(H) 4.9 5.4(H)  Chloride 96 - 106 mmol/L 96 105 105  CO2 20 - 29 mmol/L 22 22 24   Calcium 8.6 - 10.2 mg/dL 11.2(H) 9.0 9.3  Total  Protein 6.0 - 8.5 g/dL 6.8 - -  Total Bilirubin 0.0 - 1.2 mg/dL 0.2 - -  Alkaline Phos 44 - 121 IU/L 77 - -  AST 0 - 40 IU/L 17 - -  ALT 0 - 44 IU/L 8 - -    EGD 06/21/2020: - Near obstructing, malignant-appearing esophageal tumor was found in the mid esophagus. Biopsied. - Mild gastritis. Biopsied. - Medium-sized hiatal hernia. - Normal examined duodenum.  A. STOMACH, BIOPSY:  - Gastric antral and oxyntic mucosa with nonspecific reactive  gastropathy and changes suggestive of adjacent erosion/ulceration  - Warthin Starry stain is negative for Helicobacter pylori   B. ESOPHAGUS, MASS, BIOPSY:  - Poorly differentiated squamous cell carcinoma. See comment   ECHO 07/04/2020: LVEF 50 to 55%.  Right ventricular systolic function is normal.  Tricuspid valve regurgitation is moderate to severe.  Chest CT without contrast 06/20/2020: 1. Advanced emphysematous changes and areas of pulmonary scarring. 2. No acute pulmonary findings or worrisome pulmonary lesions. 3. 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 4. No mediastinal or hilar mass or adenopathy. 5. Aortic and Three-vessel coronary artery calcifications. 6. Emphysema and aortic atherosclerosis. Aortic Atherosclerosis  Abdominal/pelvic CT with contrast 06/22/2020: 1. No evidence for acute abnormality of the abdomen or pelvis. 2. Small hepatic cysts.  No evidence for metastatic disease. 3. Stable appearance of LEFT lung base nodule. 4. Stable appearance of hepatic and renal cysts. 5. Chronic densely calcified splenic mass, consistent with benign process. 6. Prominent prostate gland. 7. Aortic Atherosclerosis   Head CT 06/22/2020: No metastatic disease or acute intracranial abnormality. Largely unremarkable for age CT appearance of the brain   Current Outpatient Medications on File Prior to Visit  Medication Sig Dispense Refill  .  apixaban (ELIQUIS) 5 MG TABS tablet Place 1 tablet (5 mg total) into feeding tube 2 (two) times daily. 60 tablet 1  . MULTAQ 400 MG tablet PLACE 1 TABLET IN FEEDING TUBE TWO TIMES DAILY WITH A MEAL 60 tablet 0  . Nutritional Supplements (FEEDING SUPPLEMENT, OSMOLITE 1.5 CAL,) LIQD Place 237 mLs into feeding tube 6 (six) times daily. 42660 mL 1  . oxyCODONE-acetaminophen (PERCOCET) 10-325 MG tablet Take 1 tablet by mouth every 6 (six) hours as needed for pain. 120 tablet 0  . polyethylene glycol (MIRALAX / GLYCOLAX) 17 g packet Place 17 g into feeding tube daily as needed for mild constipation. 14 each 0  . sodium zirconium cyclosilicate (LOKELMA) 10 g PACK packet Place 10 g into feeding tube daily. 30 packet 2  . Water For Irrigation, Sterile (FREE WATER) SOLN Place 100 mLs into feeding tube 6 (six) times daily. 18000 mL 1  . metoprolol tartrate (LOPRESSOR) 25 mg/10 mL SUSP Place 10 mLs (25 mg total) into feeding tube 2 (two) times daily. 600 mL 0   No current facility-administered medications on file prior to visit.   Allergies  Allergen Reactions  . 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 Flomax [Tamsulosin Hcl]     Dizzy     Current Medications, Allergies, Past Medical History, Past Surgical History, Family History and Social History were reviewed in [employer] record.   Review of Systems:   See HPI, all other systems reviewed and are negative    Physical Exam: BP 122/60   Pulse (!) 104   Ht 5\' 9"  (1.753 m)   Wt 100 lb 6 oz (45.5 kg)   BMI 14.82 kg/m  General: Cachectic appearing 79 year old male male in no acute distress. Head: Normocephalic and atraumatic. Eyes: No scleral icterus. Conjunctiva pink . Ears: Normal auditory acuity. Mouth: Absent dentition.  No ulcers or lesions.  Lungs: Clear throughout to auscultation. Heart: Regular rate and rhythm, no murmur. Abdomen: Soft, flat, lack of adipose tissue.   Nontender and nondistended. No masses or hepatomegaly. Normal bowel sounds x 4 quadrants.  G-tube site intact. Rectal: Deferred. Musculoskeletal: Symmetrical with no gross deformities. Extremities: No edema. Neurological: Alert oriented x 4. No focal deficits.  Psychological: Alert and cooperative. Normal mood and affect  Assessment and Recommendations:  26.  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 EUS benefits and risks discussed including risk with sedation, risk of bleeding, perforation and infection.  EUS  to be scheduled as soon as possible as his cancer treatment options are pending until EUS completed. Patient to hold Eliquis for 2 days prior to EUS, however, if he inadvertently forgets to hold the Eliquis he should still present for the EUS as verified by Dr. Ardis Hughs. -Recommend for the patient to schedule a follow-up appoint with Dr. Marin Olp as soon as possible -Nutritional consult, defer to oncology -Continue Ensure and Osmolite as previously ordered -Further recommendations to be determined after EUS completed  2. IDA secondary to # 1.  Hg 11.1.  -Further iron infusions deferred to oncology   3. History of a tubulovillous colon polyp in 2010 -No plans for a colonoscopy at this time

## 2020-09-05 NOTE — Progress Notes (Addendum)
09/05/2020 Daniel [name] [id number] 08-13-1941   Chief Complaint: Esophageal cancer, schedule an EUS  History of Present Illness: Daniel [name] is a 79 year old male with a past medical history of hypertension, hyperlipidemia, heart murmur, cardiomyopathy, atrial fibrillation s/p cardioversion 05/2010 on Eliquis, peripheral arterial disease s/p left common iliac artery stent, gastric ulcers 2007, s/p EUS in 2011 due to having a tiny submucosal lesion in the proximal stomach which was identified as a calcified cystic lesion on his spleen and tubulovillous colon polyp 2010.   He was initially evaluated in our office by Dr. [name] on 06/19/2020 due to having odynophagia. An EGD was ordered. Labs were done which showed profound hyponatremia (NA+ 119) and he was sent to the ED. He was admitted to [facility] on 06/20/2020 with odynophagia, weight loss and hyponatremia.  He underwent an EGD by Dr. Tarri Glenn on 06/21/2020 which identified a near obstructing malignant appearing esophageal tumor to the mid esophagus.  Mild gastritis and a medium size hiatal hernia was noted.  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  He was evaluated by oncologist Dr. Marin Olp during his hospital admission.  A head/chest/abdomen and pelvic CTs did not show any evidence of metastasis.  An esophageal endoscopic ultrasound to look at the surrounding lymph nodes to further stage his cancer was recommended.  Dr. Marin Olp also recommended radiation oncology evaluation.  A PEG tube was placed as he was significantly malnourished.  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 He had iron deficiency anemia and received Feraheme x1 infusion.  He was discharged home on 07/09/2020.  He was scheduled for an  EUS as an outpatient on 08/07/2020, however, the EUS was canceled by pretesting as he did not stop his Eliquis for 2 days prior to his procedure date. Dr. Ardis Hughs attempted to contact the patient to instruct him to proceed with the EUS on Eliquis as scheduled. The patient stated he did not receive this message therefore the EUS was not done.  Mr. [name] presents to our office today accompanied by his son Dellis [name] to reschedule an EUS.  He has not seen his oncologist Dr. Marin Olp since he was discharged from the hospital.  He continues to have chest and back pain for which he takes Percocet 10/325 1 tab every 4-6 hours.  He is coughing up clear phlegm with a few streaks of blood a few days ago. No frank hemoptysis. His PEG tube remains intact.  He is drinking three 8 ounce cans of Ensure and water daily.  He is instilling 4 cans of Osmolite 1.5 into the G-tube daily.  His weight was around 87 pounds during his hospitalization and his weight in our office today is 100 pounds.  He denies having any nausea or vomiting.  No abdominal pain.  He is passing a softly formed to loose brown bowel movement every other day.  No rectal bleeding or melena.  Labs 09/02/2020 showed a WBC 8.4.  Hemoglobin 11.1 up from 8.3.  Potassium 5.8.  Sodium 133.  Calcium 11.2.  LFTs were normal.  No other complaints today.  CBC Latest Ref Rng & Units 09/02/2020 07/09/2020 07/08/2020  WBC 3.4 - 10.8 x10E3/uL 8.4 5.7 5.5  Hemoglobin 13.0 - 17.7 g/dL 11.1(L) 8.3(L) 8.1(L)  Hematocrit 37.5 - 51.0 % 33.2(L) 25.9(L) 25.3(L)  Platelets 150 - 450 x10E3/uL 338 364  331    CMP Latest Ref Rng & Units 09/02/2020 07/09/2020 07/09/2020  Glucose 65 - 99 mg/dL 95 97 143(H)  BUN 8 - 27 mg/dL 24 22 22   Creatinine 0.76 - 1.27 mg/dL 1.01 0.99 1.03  Sodium 134 - 144 mmol/L 133(L) 133(L) 133(L)  Potassium 3.5 - 5.2 mmol/L 5.8(H) 4.9 5.4(H)  Chloride 96 - 106 mmol/L 96 105 105  CO2 20 - 29 mmol/L 22 22 24   Calcium 8.6 - 10.2 mg/dL 11.2(H) 9.0 9.3  Total  Protein 6.0 - 8.5 g/dL 6.8 - -  Total Bilirubin 0.0 - 1.2 mg/dL 0.2 - -  Alkaline Phos 44 - 121 IU/L 77 - -  AST 0 - 40 IU/L 17 - -  ALT 0 - 44 IU/L 8 - -    EGD 06/21/2020: - Near obstructing, malignant-appearing esophageal tumor was found in the mid esophagus. Biopsied. - Mild gastritis. Biopsied. - Medium-sized hiatal hernia. - Normal examined duodenum.  A. STOMACH, BIOPSY:  - Gastric antral and oxyntic mucosa with nonspecific reactive  gastropathy and changes suggestive of adjacent erosion/ulceration  - Warthin Starry stain is negative for Helicobacter pylori   B. ESOPHAGUS, MASS, BIOPSY:  - Poorly differentiated squamous cell carcinoma. See comment   ECHO 07/04/2020: LVEF 50 to 55%.  Right ventricular systolic function is normal.  Tricuspid valve regurgitation is moderate to severe.  Chest CT without contrast 06/20/2020: 1. Advanced emphysematous changes and areas of pulmonary scarring. 2. No acute pulmonary findings or worrisome pulmonary lesions. 3. 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 4. No mediastinal or hilar mass or adenopathy. 5. Aortic and Three-vessel coronary artery calcifications. 6. Emphysema and aortic atherosclerosis. Aortic Atherosclerosis  Abdominal/pelvic CT with contrast 06/22/2020: 1. No evidence for acute abnormality of the abdomen or pelvis. 2. Small hepatic cysts.  No evidence for metastatic disease. 3. Stable appearance of LEFT lung base nodule. 4. Stable appearance of hepatic and renal cysts. 5. Chronic densely calcified splenic mass, consistent with benign process. 6. Prominent prostate gland. 7. Aortic Atherosclerosis   Head CT 06/22/2020: No metastatic disease or acute intracranial abnormality. Largely unremarkable for age CT appearance of the brain   Current Outpatient Medications on File Prior to Visit  Medication Sig Dispense Refill  .  apixaban (ELIQUIS) 5 MG TABS tablet Place 1 tablet (5 mg total) into feeding tube 2 (two) times daily. 60 tablet 1  . MULTAQ 400 MG tablet PLACE 1 TABLET IN FEEDING TUBE TWO TIMES DAILY WITH A MEAL 60 tablet 0  . Nutritional Supplements (FEEDING SUPPLEMENT, OSMOLITE 1.5 CAL,) LIQD Place 237 mLs into feeding tube 6 (six) times daily. 42660 mL 1  . oxyCODONE-acetaminophen (PERCOCET) 10-325 MG tablet Take 1 tablet by mouth every 6 (six) hours as needed for pain. 120 tablet 0  . polyethylene glycol (MIRALAX / GLYCOLAX) 17 g packet Place 17 g into feeding tube daily as needed for mild constipation. 14 each 0  . sodium zirconium cyclosilicate (LOKELMA) 10 g PACK packet Place 10 g into feeding tube daily. 30 packet 2  . Water For Irrigation, Sterile (FREE WATER) SOLN Place 100 mLs into feeding tube 6 (six) times daily. 18000 mL 1  . metoprolol tartrate (LOPRESSOR) 25 mg/10 mL SUSP Place 10 mLs (25 mg total) into feeding tube 2 (two) times daily. 600 mL 0   No current facility-administered medications on file prior to visit.   Allergies  Allergen Reactions  . 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 Flomax [Tamsulosin Hcl]     Dizzy     Current Medications, Allergies, Past Medical History, Past Surgical History, Family History and Social History were reviewed in [employer] record.   Review of Systems:   See HPI, all other systems reviewed and are negative    Physical Exam: BP 122/60   Pulse (!) 104   Ht 5\' 9"  (1.753 m)   Wt 100 lb 6 oz (45.5 kg)   BMI 14.82 kg/m  General: Cachectic appearing 79 year old male male in no acute distress. Head: Normocephalic and atraumatic. Eyes: No scleral icterus. Conjunctiva pink . Ears: Normal auditory acuity. Mouth: Absent dentition.  No ulcers or lesions.  Lungs: Clear throughout to auscultation. Heart: Regular rate and rhythm, no murmur. Abdomen: Soft, flat, lack of adipose tissue.   Nontender and nondistended. No masses or hepatomegaly. Normal bowel sounds x 4 quadrants.  G-tube site intact. Rectal: Deferred. Musculoskeletal: Symmetrical with no gross deformities. Extremities: No edema. Neurological: Alert oriented x 4. No focal deficits.  Psychological: Alert and cooperative. Normal mood and affect  Assessment and Recommendations:  78.  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 EUS benefits and risks discussed including risk with sedation, risk of bleeding, perforation and infection.  EUS  to be scheduled as soon as possible as his cancer treatment options are pending until EUS completed. Patient to hold Eliquis for 2 days prior to EUS, however, if he inadvertently forgets to hold the Eliquis he should still present for the EUS as verified by Dr. Ardis Hughs. -Recommend for the patient to schedule a follow-up appoint with Dr. Marin Olp as soon as possible -Nutritional consult, defer to oncology -Continue Ensure and Osmolite as previously ordered -Further recommendations to be determined after EUS completed  2. IDA secondary to # 1.  Hg 11.1.  -Further iron infusions deferred to oncology   3. History of a tubulovillous colon polyp in 2010 -No plans for a colonoscopy at this time

## 2020-09-06 ENCOUNTER — Other Ambulatory Visit: Payer: Self-pay

## 2020-09-06 ENCOUNTER — Telehealth: Payer: Self-pay

## 2020-09-06 DIAGNOSIS — C159 Malignant neoplasm of esophagus, unspecified: Secondary | ICD-10-CM

## 2020-09-06 NOTE — Telephone Encounter (Signed)
Yes, 2 day hold.  If he forgets to stop it (again) he should still come in for his test on time to delay his care no longer.

## 2020-09-06 NOTE — Telephone Encounter (Signed)
Dr Ardis Hughs does this pt need to stop his Eliquis? He has been scheduled for 09/26/20 at [facility].

## 2020-09-06 NOTE — Telephone Encounter (Signed)
-----   Message from Noralyn Pick, NP sent at 09/06/2020  6:33 AM EDT ----- Regarding: RE: EUS asap Dr. Rush Landmark, thank you for your reply.  Torey Regan, please contact the patient to schedule an EUS with Dr. Ardis Hughs on Thurs Oct. 7th at 11:45am.   The patient told me he doesn't know how to retrieve messages left on his phone so his family intermittently checks his messages. So... if you don't reach him directly and you leave him a message it might be best to also call his son Jacqulynn Cadet to make sure all info received.   Thank you.  ----- Message ----- From: Irving Copas., MD Sent: 09/06/2020   3:00 AM EDT To: Milus Banister, MD, Timothy Lasso, RN, # Subject: RE: EUS asap                                   CKS,Suspect the date that Dr. Ardis Hughs has set up or told you about will be most likely is I am booked nearly fully through November.If a cancellation occurs then Chong Sicilian will work on getting him in before the appointment for DJ.Thanks.GM ----- Message ----- From: Noralyn Pick, NP Sent: 09/05/2020  12:03 PM EDT To: Milus Banister, MD, Timothy Lasso, RN, # Subject: EUS asap                                       Alexee Delsanto, please check with Dr. Rush Landmark and see if he is able to do an EUS to stage this patient's esophageal cancer  prior to Oct. 7th.  If not, Dr. Ardis Hughs can do his EUS on Thurs Oct. 7th at 11:45am. Per Dr. Ardis Hughs, patient should hold Eliquis for 2 days. However, if the patient forgets to hold the Eliquis he will still proceed with doing the EUS as scheduled.   Please have the patient schedule an appointment with oncologist Dr. Marin Olp asap (he saw pt in the hospital).   Let me know the outcome. Thx

## 2020-09-06 NOTE — Telephone Encounter (Signed)
EUS scheduled for 09/26/20 at 1130 am at [facility] with Dr Ardis Hughs.  Covid test on 10/4 at 950 am.    Tried to reach pt and no answer and voice mail not set up.

## 2020-09-09 NOTE — Telephone Encounter (Signed)
EUS scheduled, pt instructed and medications reviewed.  Patient instructions mailed to home.  Patient to call with any questions or concerns. The pt was also advised to stop eliquis 2 days prior to his appt .  He verbalized understanding of procedure instructions and also COVID testing.

## 2020-09-10 ENCOUNTER — Telehealth: Payer: Self-pay | Admitting: *Deleted

## 2020-09-10 NOTE — Telephone Encounter (Signed)
Correct.  Thanks. 

## 2020-09-10 NOTE — Telephone Encounter (Signed)
Call from Gildardo Cranker, [place] - wants to  Know if Oxycodone 10/325 mg ,received on 9/13, is replacing Oxycodone 10 mg - told her yes.  Is this correct?  "He felt like the Percocet formulation was more beneficial to him when he used in the hospital.  I have prescribed him Percocet 10-325 mg to be used every 6 hours as needed for pain."

## 2020-09-17 ENCOUNTER — Other Ambulatory Visit: Payer: Self-pay | Admitting: Student

## 2020-09-23 ENCOUNTER — Other Ambulatory Visit (HOSPITAL_COMMUNITY)
Admission: RE | Admit: 2020-09-23 | Discharge: 2020-09-23 | Disposition: A | Discharge status: Home / Self Care | Payer: Medicare Other | Source: Ambulatory Visit | Attending: Gastroenterology | Admitting: Gastroenterology

## 2020-09-23 DIAGNOSIS — Z01812 Encounter for preprocedural laboratory examination: Secondary | ICD-10-CM | POA: Diagnosis not present

## 2020-09-23 DIAGNOSIS — Z20822 Contact with and (suspected) exposure to covid-19: Secondary | ICD-10-CM | POA: Diagnosis not present

## 2020-09-23 LAB — SARS CORONAVIRUS 2 (TAT 6-24 HRS): SARS Coronavirus 2: NEGATIVE

## 2020-09-24 ENCOUNTER — Other Ambulatory Visit: Payer: Self-pay

## 2020-09-24 MED ORDER — METOPROLOL TARTRATE 25 MG/10 ML ORAL SUSPENSION: 25.0000 mg | Freq: Two times a day (BID) | ORAL | 0 refills | Status: DC

## 2020-09-24 NOTE — Addendum Note (Signed)
Addended by: Velora Heckler on: 09/24/2020 04:11 PM   Modules accepted: Orders

## 2020-09-25 ENCOUNTER — Other Ambulatory Visit: Payer: Self-pay | Admitting: Student in an Organized Health Care Education/Training Program

## 2020-09-25 NOTE — Progress Notes (Signed)
Called patient to discuss endoscopy appointment for 09/26/20. Advised NPO after midnight. Patient understands to not smoke before his procedure. Patient took last dose of lopressor 09/23/20 and needs a new prescription for refill. Patient will call his primary care physician for a refill.

## 2020-09-25 NOTE — Telephone Encounter (Signed)
RTC to patient.  He states he is needing a refill on his metoprolol susp.  Patient informed [facility] received a refill request from [facility] yesterday, Dr Evette Doffing signed a printed RX and this was faxed to [facility] yesterday afternoon with a confirmation receipt.  Asked patient if he contacted his pharmacy and he states no.  RN informed patient [facility] [place] received RX yesterday and to call [facility] back if he has any problems. Original RX on triage desk. SChaplin, RN,BSN

## 2020-09-25 NOTE — Telephone Encounter (Signed)
Need refill on metoprolol tartrate (LOPRESSOR) 25 mg/10 mL SUSP ;pt contact [place], [place] [facility] [place] contact pt regarding medicine [phone]

## 2020-09-26 ENCOUNTER — Ambulatory Visit (HOSPITAL_COMMUNITY): Payer: Medicare Other | Admitting: Anesthesiology

## 2020-09-26 ENCOUNTER — Encounter (HOSPITAL_COMMUNITY): Payer: Self-pay | Admitting: Gastroenterology

## 2020-09-26 ENCOUNTER — Encounter (HOSPITAL_COMMUNITY)
Admission: RE | Disposition: A | Discharge status: Home / Self Care | Payer: Self-pay | Source: Ambulatory Visit | Attending: Gastroenterology

## 2020-09-26 ENCOUNTER — Ambulatory Visit (HOSPITAL_COMMUNITY)
Admission: RE | Admit: 2020-09-26 | Discharge: 2020-09-26 | Disposition: A | Discharge status: Home / Self Care | Payer: Medicare Other | Source: Ambulatory Visit | Attending: Gastroenterology | Admitting: Gastroenterology

## 2020-09-26 ENCOUNTER — Other Ambulatory Visit: Payer: Self-pay

## 2020-09-26 DIAGNOSIS — I2510 Atherosclerotic heart disease of native coronary artery without angina pectoris: Secondary | ICD-10-CM | POA: Diagnosis not present

## 2020-09-26 DIAGNOSIS — I252 Old myocardial infarction: Secondary | ICD-10-CM | POA: Diagnosis not present

## 2020-09-26 DIAGNOSIS — I10 Essential (primary) hypertension: Secondary | ICD-10-CM | POA: Diagnosis not present

## 2020-09-26 DIAGNOSIS — Z79899 Other long term (current) drug therapy: Secondary | ICD-10-CM | POA: Insufficient documentation

## 2020-09-26 DIAGNOSIS — F17200 Nicotine dependence, unspecified, uncomplicated: Secondary | ICD-10-CM | POA: Diagnosis not present

## 2020-09-26 DIAGNOSIS — C159 Malignant neoplasm of esophagus, unspecified: Secondary | ICD-10-CM

## 2020-09-26 DIAGNOSIS — R011 Cardiac murmur, unspecified: Secondary | ICD-10-CM | POA: Insufficient documentation

## 2020-09-26 DIAGNOSIS — Z888 Allergy status to other drugs, medicaments and biological substances status: Secondary | ICD-10-CM | POA: Insufficient documentation

## 2020-09-26 DIAGNOSIS — I429 Cardiomyopathy, unspecified: Secondary | ICD-10-CM | POA: Insufficient documentation

## 2020-09-26 DIAGNOSIS — I739 Peripheral vascular disease, unspecified: Secondary | ICD-10-CM | POA: Insufficient documentation

## 2020-09-26 DIAGNOSIS — I498 Other specified cardiac arrhythmias: Secondary | ICD-10-CM | POA: Diagnosis not present

## 2020-09-26 DIAGNOSIS — Z7901 Long term (current) use of anticoagulants: Secondary | ICD-10-CM | POA: Diagnosis not present

## 2020-09-26 DIAGNOSIS — Z86010 Personal history of colonic polyps: Secondary | ICD-10-CM | POA: Insufficient documentation

## 2020-09-26 DIAGNOSIS — M1990 Unspecified osteoarthritis, unspecified site: Secondary | ICD-10-CM | POA: Insufficient documentation

## 2020-09-26 DIAGNOSIS — D509 Iron deficiency anemia, unspecified: Secondary | ICD-10-CM | POA: Insufficient documentation

## 2020-09-26 DIAGNOSIS — C158 Malignant neoplasm of overlapping sites of esophagus: Secondary | ICD-10-CM | POA: Insufficient documentation

## 2020-09-26 DIAGNOSIS — K449 Diaphragmatic hernia without obstruction or gangrene: Secondary | ICD-10-CM | POA: Diagnosis not present

## 2020-09-26 DIAGNOSIS — I081 Rheumatic disorders of both mitral and tricuspid valves: Secondary | ICD-10-CM | POA: Diagnosis not present

## 2020-09-26 DIAGNOSIS — E785 Hyperlipidemia, unspecified: Secondary | ICD-10-CM | POA: Insufficient documentation

## 2020-09-26 DIAGNOSIS — K279 Peptic ulcer, site unspecified, unspecified as acute or chronic, without hemorrhage or perforation: Secondary | ICD-10-CM | POA: Insufficient documentation

## 2020-09-26 DIAGNOSIS — Z95820 Peripheral vascular angioplasty status with implants and grafts: Secondary | ICD-10-CM | POA: Diagnosis not present

## 2020-09-26 HISTORY — PX: ESOPHAGOGASTRODUODENOSCOPY (EGD) WITH PROPOFOL: SHX5813

## 2020-09-26 HISTORY — PX: EUS: SHX5427

## 2020-09-26 HISTORY — DX: Encounter for screening for human immunodeficiency virus (HIV): Z11.4

## 2020-09-26 SURGERY — UPPER ENDOSCOPIC ULTRASOUND (EUS) RADIAL
Anesthesia: Monitor Anesthesia Care

## 2020-09-26 MED ORDER — LACTATED RINGERS IV SOLN
INTRAVENOUS | Status: AC | PRN
  Administered 2020-09-26: 10 mL/h via INTRAVENOUS

## 2020-09-26 MED ORDER — PROPOFOL 10 MG/ML IV BOLUS
INTRAVENOUS | Status: DC | PRN
  Administered 2020-09-26: 30 mg via INTRAVENOUS

## 2020-09-26 MED ORDER — LIDOCAINE 2% (20 MG/ML) 5 ML SYRINGE
INTRAMUSCULAR | Status: DC | PRN
  Administered 2020-09-26: 80 mg via INTRAVENOUS

## 2020-09-26 MED ORDER — PROPOFOL 10 MG/ML IV BOLUS
INTRAVENOUS | Status: AC
  Filled 2020-09-26: qty 20

## 2020-09-26 MED ORDER — SODIUM CHLORIDE 0.9 % IV SOLN: INTRAVENOUS | Status: DC

## 2020-09-26 MED ORDER — PROPOFOL 500 MG/50ML IV EMUL
INTRAVENOUS | Status: AC
  Filled 2020-09-26: qty 50

## 2020-09-26 MED ORDER — PROPOFOL 500 MG/50ML IV EMUL
INTRAVENOUS | Status: DC | PRN
  Administered 2020-09-26: 100 ug/kg/min via INTRAVENOUS

## 2020-09-26 NOTE — Discharge Instructions (Signed)
YOU HAD AN ENDOSCOPIC PROCEDURE TODAY: Refer to the procedure report and other information in the discharge instructions given to you for any specific questions about what was found during the examination. If this information does not answer your questions, please call [facility] office at [phone] to clarify.   YOU SHOULD EXPECT: Some feelings of bloating in the abdomen. Passage of more gas than usual. Walking can help get rid of the air that was put into your GI tract during the procedure and reduce the bloating. If you had a lower endoscopy (such as a colonoscopy or flexible sigmoidoscopy) you may notice spotting of blood in your stool or on the toilet paper. Some abdominal soreness may be present for a day or two, also.  DIET: As tolerated. Heavy or fried foods are harder to digest and may make you feel nauseous or bloated. Drink plenty of fluids but you should avoid alcoholic beverages for 24 hours. If you had a esophageal dilation, please see attached instructions for diet.    ACTIVITY: Your care partner should take you home directly after the procedure. You should plan to take it easy, moving slowly for the rest of the day. You can resume normal activity the day after the procedure however YOU SHOULD NOT DRIVE, use power tools, machinery or perform tasks that involve climbing or major physical exertion for 24 hours (because of the sedation medicines used during the test).   SYMPTOMS TO REPORT IMMEDIATELY: A gastroenterologist can be reached at any hour. Please call [phone]  for any of the following symptoms:  . Following lower endoscopy (colonoscopy, flexible sigmoidoscopy) Excessive amounts of blood in the stool  Significant tenderness, worsening of abdominal pains  Swelling of the abdomen that is new, acute  Fever of 100 or higher  . Following upper endoscopy (EGD, EUS, ERCP, esophageal dilation) Vomiting of blood or coffee ground material  New, significant abdominal pain  New,  significant chest pain or pain under the shoulder blades  Painful or persistently difficult swallowing  New shortness of breath  Black, tarry-looking or red, bloody stools  FOLLOW UP:  If any biopsies were taken you will be contacted by phone or by letter within the next 1-3 weeks. Call [phone]  if you have not heard about the biopsies in 3 weeks.  Please also call with any specific questions about appointments or follow up tests.

## 2020-09-26 NOTE — Op Note (Signed)
[facility] Patient Name: Daniel [name] Procedure Date: 09/26/2020 MRN: [id number] Attending MD: Milus Banister , MD Date of Birth: 1941-02-17 CSN: [id number] Age: 79 Admit Type: Outpatient Procedure:                Upper EUS Indications:              Staging for esophageal squamous cell cancer,                            diagnosed 06/2020 Dr. Tarri Glenn EGD Providers:                Milus Banister, MD, Cleda Daub, RN, Tyna Jaksch Technician Referring MD:              Medicines:                Monitored Anesthesia Care Complications:            No immediate complications. Estimated blood loss:                            None. Estimated Blood Loss:     Estimated blood loss: none. Procedure:                Pre-Anesthesia Assessment:                           - Prior to the procedure, a History and Physical                            was performed, and patient medications and                            allergies were reviewed. The patient's tolerance of                            previous anesthesia was also reviewed. The risks                            and benefits of the procedure and the sedation                            options and risks were discussed with the patient.                            All questions were answered, and informed consent                            was obtained. Prior Anticoagulants: The patient has                            taken Eliquis (apixaban), last dose was 3 days                            prior  to procedure. ASA Grade Assessment: III - A                            patient with severe systemic disease. After                            reviewing the risks and benefits, the patient was                            deemed in satisfactory condition to undergo the                            procedure.                           After obtaining informed consent, the endoscope was                             passed under direct vision. Throughout the                            procedure, the patient's blood pressure, pulse, and                            oxygen saturations were monitored continuously. The                            [id number] ([id number]) Olympus was introduced through                            the mouth, and advanced to the middle third of                            esophagus. The upper EUS was accomplished without                            difficulty. The patient tolerated the procedure                            well. Scope In: Scope Out: Findings:      ENDOSCOPIC FINDING: :      1. Circumferntial tumor with proximal edge located about 24cm from the       incisors. I was unable to advance the large bore radial echoendoscope       through the malignant stricture.      ENDOSONOGRAPHIC FINDING: :      1. The tumor above correlated with a circumferential hypoechoic,       heterogeneous mass that clearly passes into and through the muscularis       propria layer of the proximal esophageal wall (uT3).      2. Two small but suspicious (round, hypoechoic, homogeneous)       paraesophageal lymphnodes abutting the proximal esophagus at the level       of the tumor. Impression:               - The known proximal  to mid esophageal squamous                            cell cancer is causing a stricture through which                            the large bore radial echoendoscope was unable pass                            and so I obtained incomplete views of the tumor.                            From the proxima aspect of the tumor this is at                            least staged uT3N1. Moderate Sedation:      Not Applicable - Patient had care per Anesthesia. Recommendation:           - Discharge patient to home.                           - He will need oncologic follow up with Dr.                            Marin Olp, our office will help expedite. Procedure Code(s):        ---  Professional ---                           [phone], Esophagoscopy, flexible, transoral; with                            endoscopic ultrasound examination Diagnosis Code(s):        --- Professional ---                           C15.9, Malignant neoplasm of esophagus, unspecified CPT copyright 2019 American Medical Association. All rights reserved. The codes documented in this report are preliminary and upon coder review may  be revised to meet current compliance requirements. Milus Banister, MD 09/26/2020 12:28:27 PM This report has been signed electronically. Number of Addenda: 0

## 2020-09-26 NOTE — Anesthesia Postprocedure Evaluation (Signed)
Anesthesia Post Note  Patient: Daniel [name]  Procedure(s) Performed: UPPER ENDOSCOPIC ULTRASOUND (EUS) RADIAL (N/A )     Patient location during evaluation: PACU Anesthesia Type: MAC Level of consciousness: awake and alert Pain management: pain level controlled Vital Signs Assessment: post-procedure vital signs reviewed and stable Respiratory status: spontaneous breathing and respiratory function stable Cardiovascular status: stable Postop Assessment: no apparent nausea or vomiting Anesthetic complications: no   No complications documented.  Last Vitals:  Vitals:   09/26/20 1240 09/26/20 1250  BP: (!) 147/72 (!) 142/76  Pulse: 78 76  Resp: 19 15  Temp:    SpO2: 100% 98%    Last Pain:  Vitals:   09/26/20 1250  TempSrc:   PainSc: 0-No pain                 Merlinda Frederick

## 2020-09-26 NOTE — Transfer of Care (Signed)
Immediate Anesthesia Transfer of Care Note  Patient: Daniel [name]  Procedure(s) Performed: UPPER ENDOSCOPIC ULTRASOUND (EUS) RADIAL (N/A )  Patient Location: PACU  Anesthesia Type:MAC  Level of Consciousness: awake, alert  and oriented  Airway & Oxygen Therapy: Patient Spontanous Breathing and Patient connected to face mask oxygen  Post-op Assessment: Report given to RN and Post -op Vital signs reviewed and stable  Post vital signs: Reviewed and stable  Last Vitals:  Vitals Value Taken Time  BP    Temp    Pulse    Resp    SpO2      Last Pain:  Vitals:   09/26/20 1041  TempSrc: Oral  PainSc: 0-No pain         Complications: No complications documented.

## 2020-09-26 NOTE — Interval H&P Note (Signed)
History and Physical Interval Note:  09/26/2020 10:32 AM  Daniel [name]  has presented today for surgery, with the diagnosis of esophageal cancer.  The various methods of treatment have been discussed with the patient and family. After consideration of risks, benefits and other options for treatment, the patient has consented to  Procedure(s): UPPER ENDOSCOPIC ULTRASOUND (EUS) RADIAL (N/A) as a surgical intervention.  The patient's history has been reviewed, patient examined, no change in status, stable for surgery.  I have reviewed the patient's chart and labs.  Questions were answered to the patient's satisfaction.     Milus Banister

## 2020-09-26 NOTE — Anesthesia Preprocedure Evaluation (Addendum)
Anesthesia Evaluation  Patient identified by MRN, date of birth, ID band Patient awake    Reviewed: Allergy & Precautions, NPO status , Patient's Chart, lab work & pertinent test results  Airway Mallampati: II  TM Distance: >3 FB Neck ROM: Full    Dental no notable dental hx.    Pulmonary neg pulmonary ROS, Current Smoker and Patient abstained from smoking.,    Pulmonary exam normal breath sounds clear to auscultation       Cardiovascular Exercise Tolerance: Poor hypertension, Pt. on medications + Peripheral Vascular Disease  Normal cardiovascular exam Rhythm:Regular Rate:Normal  Sinus rhythm with Premature atrial complexes Septal infarct , age undetermined Abnormal ECG No significant change since last tracing Confirmed by Glori Bickers [id number]) on 07/09/2020 10:11:17 AM   1. Left ventricular ejection fraction, by estimation, is 50 to 55%. The  left ventricle has low normal function. The left ventricle has no regional  wall motion abnormalities. Left ventricular diastolic parameters are  indeterminate.  2. Right ventricular systolic function is normal. The right ventricular  size is moderately enlarged. There is mildly elevated pulmonary artery  systolic pressure. The estimated right ventricular systolic pressure is  57.3 mmHg.  3. Right atrial size was severely dilated.  4. The mitral valve is normal in structure. Mild mitral valve  regurgitation.  5. The tricuspid valve is abnormal. Tricuspid valve regurgitation is  moderate to severe.  6. The aortic valve is tricuspid. Aortic valve regurgitation is not  visualized. No aortic stenosis is present.  7. The inferior vena cava is normal in size with greater than 50%  respiratory variability, suggesting right atrial pressure of 3 mmHg.    Neuro/Psych negative neurological ROS  negative psych ROS   GI/Hepatic Neg liver ROS, PUD,   Endo/Other  negative endocrine  ROS  Renal/GU negative Renal ROS  negative genitourinary   Musculoskeletal  (+) Arthritis , Osteoarthritis,    Abdominal   Peds negative pediatric ROS (+)  Hematology negative hematology ROS (+)   Anesthesia Other Findings   Reproductive/Obstetrics negative OB ROS                             Anesthesia Physical  Anesthesia Plan  ASA: III  Anesthesia Plan: MAC   Post-op Pain Management:    Induction: Intravenous  PONV Risk Score and Plan: 0 and Treatment may vary due to age or medical condition, Propofol infusion and TIVA  Airway Management Planned: Nasal Cannula  Additional Equipment:   Intra-op Plan:   Post-operative Plan:   Informed Consent: I have reviewed the patients History and Physical, chart, labs and discussed the procedure including the risks, benefits and alternatives for the proposed anesthesia with the patient or authorized representative who has indicated his/her understanding and acceptance.   Patient has DNR.  Suspend DNR.   Dental advisory given  Plan Discussed with: CRNA and Anesthesiologist  Anesthesia Plan Comments:        Anesthesia Quick Evaluation

## 2020-09-30 ENCOUNTER — Encounter (HOSPITAL_COMMUNITY): Payer: Self-pay | Admitting: Gastroenterology

## 2020-09-30 ENCOUNTER — Telehealth: Payer: Self-pay

## 2020-09-30 DIAGNOSIS — C159 Malignant neoplasm of esophagus, unspecified: Secondary | ICD-10-CM

## 2020-09-30 NOTE — Telephone Encounter (Signed)
Milus Banister, MD sent to Marlon Pel, RN; Gatha Mayer, MD Hey,  I just did local staging via EUS for his proximal squamous cell tumor. See full report in epic. This is at least uT3N1.   Efrain Clauson he has not followed up with Dr. Marin Olp but needs to, probably sooner rather than later. Can you help expedite that?     Referral placed for Dr. Marin Olp.

## 2020-10-01 ENCOUNTER — Encounter: Payer: Self-pay | Admitting: *Deleted

## 2020-10-01 ENCOUNTER — Other Ambulatory Visit: Payer: Self-pay | Admitting: Student in an Organized Health Care Education/Training Program

## 2020-10-01 NOTE — Telephone Encounter (Signed)
Refill Request  oxyCODONE-acetaminophen (PERCOCET) 10-325 MG tablet   [place], [place] - [place]

## 2020-10-01 NOTE — Progress Notes (Signed)
Called and spoke to patient about his referral and new patient appointment. He asked that I call his son [name], today after 1pm and give him all the pertinent information.   Reached out to [place] to introduce myself as the office RN Navigator and explain our new patient process. Reviewed the reason for their referral and scheduled their new patient appointment along with labs. Provided address and directions to the office including call back phone number. Reviewed with patient any concerns they may have or any possible barriers to attending their appointment.   Informed patient about my role as a navigator and that I will meet with them prior to their New Patient appointment and more fully discuss what services I can provide. At this time patient has no further questions or needs.   Oncology Nurse Navigator Documentation  Oncology Nurse Navigator Flowsheets 10/01/2020  Abnormal Finding Date 06/21/2020  Confirmed Diagnosis Date 06/21/2020  Diagnosis Status Confirmed Diagnosis Complete  Navigator Follow Up Date: 10/07/2020  Navigator Follow Up Reason: New Patient Appointment  Navigator Location [facility]  Referral Date to RadOnc/MedOnc 09/30/2020  Navigator Encounter Type Introductory Phone Call  Patient Visit Type MedOnc  Treatment Phase Pre-Tx/Tx Discussion  Barriers/Navigation Needs Coordination of Care;Education  Education Other  Interventions Coordination of Care;Education  Acuity Level 2-Minimal Needs (1-2 Barriers Identified)  Coordination of Care Appts  Education Method Verbal  Support Groups/Services Friends and Family  Time Spent with Patient 29

## 2020-10-02 MED ORDER — OXYCODONE-ACETAMINOPHEN 10-325 MG PO TABS
1.0000 | ORAL_TABLET | Freq: Four times a day (QID) | ORAL | 0 refills | Status: DC | PRN
Start: 2020-10-02 — End: 2020-11-07

## 2020-10-07 ENCOUNTER — Ambulatory Visit: Payer: Medicare Other | Admitting: Hematology & Oncology

## 2020-10-07 ENCOUNTER — Other Ambulatory Visit: Payer: Medicare Other

## 2020-10-08 ENCOUNTER — Encounter: Payer: Self-pay | Admitting: *Deleted

## 2020-10-08 NOTE — Progress Notes (Signed)
Patient was a no-show to his new patient appointment yesterday. Called and left a message on the patient's son, Jeffrey's, voicemail. Will await call back.  Oncology Nurse Navigator Documentation  Oncology Nurse Navigator Flowsheets 10/08/2020  Abnormal Finding Date -  Confirmed Diagnosis Date -  Diagnosis Status -  Navigator Follow Up Date: -  Navigator Follow Up Reason: -  Navigator Location [facility]  Referral Date to RadOnc/MedOnc -  Navigator Encounter Type Telephone  Telephone Outgoing Call;Appt Confirmation/Clarification  Patient Visit Type MedOnc  Treatment Phase Pre-Tx/Tx Discussion  Barriers/Navigation Needs Coordination of Care;Education  Education -  Interventions Coordination of Care  Acuity Level 2-Minimal Needs (1-2 Barriers Identified)  Coordination of Care Appts  Education Method -  Support Groups/Services Friends and Family  Time Spent with Patient 15

## 2020-10-11 ENCOUNTER — Encounter: Payer: Self-pay | Admitting: *Deleted

## 2020-10-11 NOTE — Progress Notes (Signed)
Attempts to contact patient and son unsuccessful after his no-show earlier this week. Voicemails left twice. Will send letter to patient's home.  Message sent to scheduling to please reach out to referring provider to notify them of no-show.  Oncology Nurse Navigator Documentation  Oncology Nurse Navigator Flowsheets 10/11/2020  Abnormal Finding Date -  Confirmed Diagnosis Date -  Diagnosis Status -  Navigator Follow Up Date: -  Navigator Follow Up Reason: -  Navigator Location [facility]  Referral Date to RadOnc/MedOnc -  Navigator Encounter Type Telephone  Telephone Outgoing Call;Appt Confirmation/Clarification  Patient Visit Type MedOnc  Treatment Phase Pre-Tx/Tx Discussion  Barriers/Navigation Needs Coordination of Care;Education  Education -  Interventions Coordination of Care  Acuity Level 2-Minimal Needs (1-2 Barriers Identified)  Coordination of Care Appts  Education Method -  Support Groups/Services Friends and Family  Time Spent with Patient 30

## 2020-10-16 ENCOUNTER — Telehealth: Payer: Self-pay | Admitting: Student in an Organized Health Care Education/Training Program

## 2020-10-16 NOTE — Telephone Encounter (Signed)
I spoke with the patient and his son over the phone. It sounds like the patient cancelled his oncology appointment, he is not interested in further treatments of his esophageal cancer at this time. He will not tell me much more than that, just that he can make his own decisions and doesn't want to do it. I advised him that the does not have any future appointments right now, that this cancer will likely progress, and that we will be available for him as needed if he changes his mind or if he needs further symptom control. The patient understands.

## 2020-10-16 NOTE — Telephone Encounter (Signed)
Rec'd call back from the patient's son Daniel [name] who states Daniel [name] called and cancelled his own appointment with the [facility].  Also the Son reports that Palliative Care is no longer coming out to see the patient.

## 2020-10-23 ENCOUNTER — Other Ambulatory Visit: Payer: Self-pay | Admitting: Student in an Organized Health Care Education/Training Program

## 2020-11-02 ENCOUNTER — Encounter (HOSPITAL_COMMUNITY): Payer: Self-pay | Admitting: Internal Medicine

## 2020-11-02 ENCOUNTER — Emergency Department (HOSPITAL_COMMUNITY): Payer: Medicare Other

## 2020-11-02 ENCOUNTER — Inpatient Hospital Stay (HOSPITAL_COMMUNITY)
Admission: EM | Admit: 2020-11-02 | Discharge: 2020-11-06 | DRG: 871 | Disposition: A | Discharge status: Home Health | Payer: Medicare Other | Attending: Internal Medicine | Admitting: Internal Medicine

## 2020-11-02 ENCOUNTER — Other Ambulatory Visit: Payer: Self-pay

## 2020-11-02 DIAGNOSIS — I110 Hypertensive heart disease with heart failure: Secondary | ICD-10-CM | POA: Diagnosis present

## 2020-11-02 DIAGNOSIS — R0682 Tachypnea, not elsewhere classified: Secondary | ICD-10-CM | POA: Diagnosis not present

## 2020-11-02 DIAGNOSIS — W19XXXA Unspecified fall, initial encounter: Secondary | ICD-10-CM | POA: Diagnosis not present

## 2020-11-02 DIAGNOSIS — Z66 Do not resuscitate: Secondary | ICD-10-CM | POA: Diagnosis present

## 2020-11-02 DIAGNOSIS — Z681 Body mass index (BMI) 19 or less, adult: Secondary | ICD-10-CM | POA: Diagnosis not present

## 2020-11-02 DIAGNOSIS — I5022 Chronic systolic (congestive) heart failure: Secondary | ICD-10-CM

## 2020-11-02 DIAGNOSIS — I428 Other cardiomyopathies: Secondary | ICD-10-CM | POA: Diagnosis present

## 2020-11-02 DIAGNOSIS — R000 Tachycardia, unspecified: Secondary | ICD-10-CM | POA: Diagnosis not present

## 2020-11-02 DIAGNOSIS — R1310 Dysphagia, unspecified: Secondary | ICD-10-CM | POA: Diagnosis present

## 2020-11-02 DIAGNOSIS — I5042 Chronic combined systolic (congestive) and diastolic (congestive) heart failure: Secondary | ICD-10-CM | POA: Diagnosis present

## 2020-11-02 DIAGNOSIS — D509 Iron deficiency anemia, unspecified: Secondary | ICD-10-CM | POA: Diagnosis present

## 2020-11-02 DIAGNOSIS — I4891 Unspecified atrial fibrillation: Secondary | ICD-10-CM | POA: Diagnosis not present

## 2020-11-02 DIAGNOSIS — I739 Peripheral vascular disease, unspecified: Secondary | ICD-10-CM | POA: Diagnosis present

## 2020-11-02 DIAGNOSIS — N179 Acute kidney failure, unspecified: Secondary | ICD-10-CM | POA: Diagnosis present

## 2020-11-02 DIAGNOSIS — E785 Hyperlipidemia, unspecified: Secondary | ICD-10-CM | POA: Diagnosis present

## 2020-11-02 DIAGNOSIS — J690 Pneumonitis due to inhalation of food and vomit: Secondary | ICD-10-CM | POA: Diagnosis present

## 2020-11-02 DIAGNOSIS — R4182 Altered mental status, unspecified: Secondary | ICD-10-CM | POA: Diagnosis not present

## 2020-11-02 DIAGNOSIS — R0902 Hypoxemia: Secondary | ICD-10-CM | POA: Diagnosis present

## 2020-11-02 DIAGNOSIS — J90 Pleural effusion, not elsewhere classified: Secondary | ICD-10-CM | POA: Diagnosis not present

## 2020-11-02 DIAGNOSIS — Z888 Allergy status to other drugs, medicaments and biological substances status: Secondary | ICD-10-CM

## 2020-11-02 DIAGNOSIS — Z7901 Long term (current) use of anticoagulants: Secondary | ICD-10-CM

## 2020-11-02 DIAGNOSIS — J439 Emphysema, unspecified: Secondary | ICD-10-CM | POA: Diagnosis not present

## 2020-11-02 DIAGNOSIS — Z8249 Family history of ischemic heart disease and other diseases of the circulatory system: Secondary | ICD-10-CM

## 2020-11-02 DIAGNOSIS — Z79899 Other long term (current) drug therapy: Secondary | ICD-10-CM

## 2020-11-02 DIAGNOSIS — M47815 Spondylosis without myelopathy or radiculopathy, thoracolumbar region: Secondary | ICD-10-CM | POA: Diagnosis present

## 2020-11-02 DIAGNOSIS — Z931 Gastrostomy status: Secondary | ICD-10-CM

## 2020-11-02 DIAGNOSIS — Z20822 Contact with and (suspected) exposure to covid-19: Secondary | ICD-10-CM | POA: Diagnosis present

## 2020-11-02 DIAGNOSIS — N400 Enlarged prostate without lower urinary tract symptoms: Secondary | ICD-10-CM | POA: Diagnosis present

## 2020-11-02 DIAGNOSIS — N390 Urinary tract infection, site not specified: Secondary | ICD-10-CM | POA: Diagnosis present

## 2020-11-02 DIAGNOSIS — I10 Essential (primary) hypertension: Secondary | ICD-10-CM | POA: Diagnosis not present

## 2020-11-02 DIAGNOSIS — I480 Paroxysmal atrial fibrillation: Secondary | ICD-10-CM | POA: Diagnosis present

## 2020-11-02 DIAGNOSIS — R6520 Severe sepsis without septic shock: Secondary | ICD-10-CM | POA: Diagnosis present

## 2020-11-02 DIAGNOSIS — I959 Hypotension, unspecified: Secondary | ICD-10-CM | POA: Diagnosis not present

## 2020-11-02 DIAGNOSIS — E43 Unspecified severe protein-calorie malnutrition: Secondary | ICD-10-CM | POA: Diagnosis present

## 2020-11-02 DIAGNOSIS — R0689 Other abnormalities of breathing: Secondary | ICD-10-CM | POA: Diagnosis not present

## 2020-11-02 DIAGNOSIS — C159 Malignant neoplasm of esophagus, unspecified: Secondary | ICD-10-CM | POA: Diagnosis present

## 2020-11-02 DIAGNOSIS — Y92009 Unspecified place in unspecified non-institutional (private) residence as the place of occurrence of the external cause: Secondary | ICD-10-CM | POA: Diagnosis not present

## 2020-11-02 DIAGNOSIS — E875 Hyperkalemia: Secondary | ICD-10-CM | POA: Diagnosis present

## 2020-11-02 DIAGNOSIS — F17210 Nicotine dependence, cigarettes, uncomplicated: Secondary | ICD-10-CM | POA: Diagnosis present

## 2020-11-02 DIAGNOSIS — R410 Disorientation, unspecified: Secondary | ICD-10-CM | POA: Diagnosis not present

## 2020-11-02 DIAGNOSIS — J189 Pneumonia, unspecified organism: Secondary | ICD-10-CM

## 2020-11-02 DIAGNOSIS — R5383 Other fatigue: Secondary | ICD-10-CM | POA: Diagnosis not present

## 2020-11-02 DIAGNOSIS — D539 Nutritional anemia, unspecified: Secondary | ICD-10-CM | POA: Diagnosis present

## 2020-11-02 DIAGNOSIS — A419 Sepsis, unspecified organism: Principal | ICD-10-CM | POA: Diagnosis present

## 2020-11-02 HISTORY — DX: Acute kidney failure, unspecified: N17.9

## 2020-11-02 HISTORY — DX: Chronic systolic (congestive) heart failure: I50.22

## 2020-11-02 LAB — CBC WITH DIFFERENTIAL/PLATELET
Abs Immature Granulocytes: 0.32 10*3/uL — ABNORMAL HIGH (ref 0.00–0.07)
Basophils Absolute: 0 10*3/uL (ref 0.0–0.1)
Basophils Relative: 0 %
Eosinophils Absolute: 0 10*3/uL (ref 0.0–0.5)
Eosinophils Relative: 0 %
HCT: 29.8 % — ABNORMAL LOW (ref 39.0–52.0)
Hemoglobin: 9.7 g/dL — ABNORMAL LOW (ref 13.0–17.0)
Immature Granulocytes: 2 %
Lymphocytes Relative: 2 %
Lymphs Abs: 0.4 10*3/uL — ABNORMAL LOW (ref 0.7–4.0)
MCH: 33.1 pg (ref 26.0–34.0)
MCHC: 32.6 g/dL (ref 30.0–36.0)
MCV: 101.7 fL — ABNORMAL HIGH (ref 80.0–100.0)
Monocytes Absolute: 0.5 10*3/uL (ref 0.1–1.0)
Monocytes Relative: 3 %
Neutro Abs: 18 10*3/uL — ABNORMAL HIGH (ref 1.7–7.7)
Neutrophils Relative %: 93 %
Platelets: 427 10*3/uL — ABNORMAL HIGH (ref 150–400)
RBC: 2.93 MIL/uL — ABNORMAL LOW (ref 4.22–5.81)
RDW: 12.5 % (ref 11.5–15.5)
WBC: 19.3 10*3/uL — ABNORMAL HIGH (ref 4.0–10.5)
nRBC: 0 % (ref 0.0–0.2)

## 2020-11-02 LAB — COMPREHENSIVE METABOLIC PANEL
ALT: 33 U/L (ref 0–44)
AST: 31 U/L (ref 15–41)
Albumin: 2.7 g/dL — ABNORMAL LOW (ref 3.5–5.0)
Alkaline Phosphatase: 74 U/L (ref 38–126)
Anion gap: 8 (ref 5–15)
BUN: 38 mg/dL — ABNORMAL HIGH (ref 8–23)
CO2: 24 mmol/L (ref 22–32)
Calcium: 10.5 mg/dL — ABNORMAL HIGH (ref 8.9–10.3)
Chloride: 105 mmol/L (ref 98–111)
Creatinine, Ser: 1.31 mg/dL — ABNORMAL HIGH (ref 0.61–1.24)
GFR, Estimated: 55 mL/min — ABNORMAL LOW (ref >60)
Glucose, Bld: 117 mg/dL — ABNORMAL HIGH (ref 70–99)
Potassium: 4.8 mmol/L (ref 3.5–5.1)
Sodium: 137 mmol/L (ref 135–145)
Total Bilirubin: 0.3 mg/dL (ref 0.3–1.2)
Total Protein: 7 g/dL (ref 6.5–8.1)

## 2020-11-02 LAB — URINALYSIS, ROUTINE W REFLEX MICROSCOPIC
Bilirubin Urine: NEGATIVE
Glucose, UA: NEGATIVE mg/dL
Hgb urine dipstick: NEGATIVE
Ketones, ur: NEGATIVE mg/dL
Nitrite: POSITIVE — AB
Protein, ur: NEGATIVE mg/dL
Specific Gravity, Urine: 1.017 (ref 1.005–1.030)
pH: 7 (ref 5.0–8.0)

## 2020-11-02 LAB — RESPIRATORY PANEL BY RT PCR (FLU A&B, COVID)
Influenza A by PCR: NEGATIVE
Influenza B by PCR: NEGATIVE
SARS Coronavirus 2 by RT PCR: NEGATIVE

## 2020-11-02 LAB — PROTIME-INR
INR: 1.5 — ABNORMAL HIGH (ref 0.8–1.2)
Prothrombin Time: 17.3 seconds — ABNORMAL HIGH (ref 11.4–15.2)

## 2020-11-02 LAB — LACTIC ACID, PLASMA: Lactic Acid, Venous: 1.8 mmol/L (ref 0.5–1.9)

## 2020-11-02 LAB — MRSA PCR SCREENING: MRSA by PCR: NEGATIVE

## 2020-11-02 LAB — APTT: aPTT: 40 seconds — ABNORMAL HIGH (ref 24–36)

## 2020-11-02 MED ORDER — OXYCODONE HCL 5 MG PO TABS: 5.0000 mg | ORAL_TABLET | Freq: Four times a day (QID) | ORAL | Status: DC | PRN

## 2020-11-02 MED ORDER — ONDANSETRON HCL 4 MG/2ML IJ SOLN: 4.0000 mg | Freq: Four times a day (QID) | INTRAMUSCULAR | Status: DC | PRN

## 2020-11-02 MED ORDER — OXYCODONE-ACETAMINOPHEN 10-325 MG PO TABS: 1.0000 | ORAL_TABLET | Freq: Four times a day (QID) | ORAL | Status: DC | PRN

## 2020-11-02 MED ORDER — LACTATED RINGERS IV BOLUS (SEPSIS)
500.0000 mL | Freq: Once | INTRAVENOUS | Status: AC
  Administered 2020-11-02: 500 mL via INTRAVENOUS

## 2020-11-02 MED ORDER — DRONEDARONE HCL 400 MG PO TABS
400.0000 mg | ORAL_TABLET | Freq: Two times a day (BID) | ORAL | Status: DC
  Administered 2020-11-03 – 2020-11-06 (×7): 400 mg
  Filled 2020-11-02 (×10): qty 1

## 2020-11-02 MED ORDER — SODIUM CHLORIDE 0.9% FLUSH
3.0000 mL | Freq: Two times a day (BID) | INTRAVENOUS | Status: DC
  Administered 2020-11-02 – 2020-11-03 (×2): 3 mL via INTRAVENOUS

## 2020-11-02 MED ORDER — SODIUM CHLORIDE 0.9 % IV SOLN
500.0000 mg | Freq: Once | INTRAVENOUS | Status: AC
  Administered 2020-11-02: 500 mg via INTRAVENOUS
  Filled 2020-11-02: qty 500

## 2020-11-02 MED ORDER — OXYCODONE HCL 5 MG PO TABS
5.0000 mg | ORAL_TABLET | Freq: Four times a day (QID) | ORAL | Status: DC | PRN
  Administered 2020-11-04 – 2020-11-06 (×9): 5 mg
  Filled 2020-11-02 (×9): qty 1

## 2020-11-02 MED ORDER — ONDANSETRON HCL 4 MG PO TABS: 4.0000 mg | ORAL_TABLET | Freq: Four times a day (QID) | ORAL | Status: DC | PRN

## 2020-11-02 MED ORDER — ALBUTEROL SULFATE (2.5 MG/3ML) 0.083% IN NEBU
2.5000 mg | INHALATION_SOLUTION | RESPIRATORY_TRACT | Status: DC | PRN
  Administered 2020-11-04: 2.5 mg via RESPIRATORY_TRACT
  Filled 2020-11-02: qty 3

## 2020-11-02 MED ORDER — SODIUM CHLORIDE 0.9 % IV SOLN
500.0000 mg | INTRAVENOUS | Status: DC
  Administered 2020-11-03 – 2020-11-04 (×2): 500 mg via INTRAVENOUS
  Filled 2020-11-02 (×3): qty 500

## 2020-11-02 MED ORDER — ACETAMINOPHEN 325 MG PO TABS
650.0000 mg | ORAL_TABLET | Freq: Once | ORAL | Status: AC
  Administered 2020-11-02: 650 mg via ORAL
  Filled 2020-11-02: qty 2

## 2020-11-02 MED ORDER — OXYCODONE-ACETAMINOPHEN 5-325 MG PO TABS
1.0000 | ORAL_TABLET | Freq: Four times a day (QID) | ORAL | Status: DC | PRN
  Administered 2020-11-03 – 2020-11-06 (×11): 1
  Filled 2020-11-02 (×11): qty 1

## 2020-11-02 MED ORDER — LACTATED RINGERS IV SOLN: INTRAVENOUS | Status: DC

## 2020-11-02 MED ORDER — APIXABAN 5 MG PO TABS
5.0000 mg | ORAL_TABLET | Freq: Two times a day (BID) | ORAL | Status: DC
  Administered 2020-11-02: 5 mg via ORAL
  Filled 2020-11-02: qty 1

## 2020-11-02 MED ORDER — SODIUM CHLORIDE 0.9 % IV SOLN
1.0000 g | INTRAVENOUS | Status: DC
  Administered 2020-11-02: 1 g via INTRAVENOUS
  Filled 2020-11-02 (×2): qty 10

## 2020-11-02 MED ORDER — DRONEDARONE HCL 400 MG PO TABS
400.0000 mg | ORAL_TABLET | Freq: Two times a day (BID) | ORAL | Status: DC
  Filled 2020-11-02: qty 1

## 2020-11-02 MED ORDER — APIXABAN 5 MG PO TABS
5.0000 mg | ORAL_TABLET | Freq: Two times a day (BID) | ORAL | Status: DC
  Administered 2020-11-03 – 2020-11-06 (×7): 5 mg
  Filled 2020-11-02 (×7): qty 1

## 2020-11-02 MED ORDER — LACTATED RINGERS IV BOLUS (SEPSIS)
1000.0000 mL | Freq: Once | INTRAVENOUS | Status: AC
  Administered 2020-11-02: 1000 mL via INTRAVENOUS

## 2020-11-02 MED ORDER — OXYCODONE-ACETAMINOPHEN 5-325 MG PO TABS: 1.0000 | ORAL_TABLET | Freq: Four times a day (QID) | ORAL | Status: DC | PRN

## 2020-11-02 MED ORDER — SODIUM CHLORIDE 0.9 % IV SOLN
2.0000 g | INTRAVENOUS | Status: AC
  Administered 2020-11-03 – 2020-11-06 (×4): 2 g via INTRAVENOUS
  Filled 2020-11-02 (×2): qty 2
  Filled 2020-11-02 (×3): qty 20
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
## 2020-11-02 NOTE — ED Triage Notes (Signed)
Transported by [facility] from home-- dark/cloudy urine x 2 days along with fatigue and mild confusion. Patient is aao x 4. Pt has history of esophageal cancer.  Vital signs:  HR 107  BP 102/58 83% on room air and 100% on NRB Temperature 101.9  20 G IV in left forearm and EMS administered 500 cc of NS PTA

## 2020-11-03 LAB — BASIC METABOLIC PANEL
Anion gap: 7 (ref 5–15)
BUN: 22 mg/dL (ref 8–23)
CO2: 23 mmol/L (ref 22–32)
Calcium: 9.8 mg/dL (ref 8.9–10.3)
Chloride: 108 mmol/L (ref 98–111)
Creatinine, Ser: 0.9 mg/dL (ref 0.61–1.24)
GFR, Estimated: >60 mL/min (ref >60)
Glucose, Bld: 98 mg/dL (ref 70–99)
Potassium: 5.1 mmol/L (ref 3.5–5.1)
Sodium: 138 mmol/L (ref 135–145)

## 2020-11-03 LAB — CBC
HCT: 28 % — ABNORMAL LOW (ref 39.0–52.0)
Hemoglobin: 8.8 g/dL — ABNORMAL LOW (ref 13.0–17.0)
MCH: 32.6 pg (ref 26.0–34.0)
MCHC: 31.4 g/dL (ref 30.0–36.0)
MCV: 103.7 fL — ABNORMAL HIGH (ref 80.0–100.0)
Platelets: 390 10*3/uL (ref 150–400)
RBC: 2.7 MIL/uL — ABNORMAL LOW (ref 4.22–5.81)
RDW: 12.5 % (ref 11.5–15.5)
WBC: 11 10*3/uL — ABNORMAL HIGH (ref 4.0–10.5)
nRBC: 0 % (ref 0.0–0.2)

## 2020-11-03 LAB — URINE CULTURE
Culture: NO GROWTH
Special Requests: NORMAL

## 2020-11-03 LAB — EXPECTORATED SPUTUM ASSESSMENT W GRAM STAIN, RFLX TO RESP C

## 2020-11-03 LAB — MAGNESIUM
Magnesium: 2.3 mg/dL (ref 1.7–2.4)
Magnesium: 2.2 mg/dL (ref 1.7–2.4)

## 2020-11-03 LAB — GLUCOSE, CAPILLARY: Glucose-Capillary: 104 mg/dL — ABNORMAL HIGH (ref 70–99)

## 2020-11-03 LAB — PHOSPHORUS
Phosphorus: 2.5 mg/dL (ref 2.5–4.6)
Phosphorus: 2.5 mg/dL (ref 2.5–4.6)

## 2020-11-03 LAB — PROCALCITONIN: Procalcitonin: 8.56 ng/mL

## 2020-11-03 MED ORDER — OSMOLITE 1.2 CAL PO LIQD
1000.0000 mL | ORAL | Status: AC
  Administered 2020-11-03 – 2020-11-04 (×3): 1000 mL
  Filled 2020-11-03 (×2): qty 1000

## 2020-11-03 MED ORDER — ACETAMINOPHEN 160 MG/5ML PO SOLN
325.0000 mg | Freq: Four times a day (QID) | ORAL | Status: DC | PRN
  Administered 2020-11-05: 325 mg
  Filled 2020-11-03 (×2): qty 20.3

## 2020-11-03 MED ORDER — SODIUM CHLORIDE 3 % IN NEBU
4.0000 mL | INHALATION_SOLUTION | Freq: Two times a day (BID) | RESPIRATORY_TRACT | Status: AC
  Administered 2020-11-03 – 2020-11-05 (×4): 4 mL via RESPIRATORY_TRACT
  Filled 2020-11-03 (×7): qty 4

## 2020-11-03 MED ORDER — GUAIFENESIN 100 MG/5ML PO SOLN
10.0000 mL | Freq: Two times a day (BID) | ORAL | Status: AC
  Administered 2020-11-03 – 2020-11-05 (×6): 200 mg
  Filled 2020-11-03: qty 20
  Filled 2020-11-03 (×3): qty 10
  Filled 2020-11-03: qty 20
  Filled 2020-11-03: qty 10

## 2020-11-03 MED ORDER — SODIUM CHLORIDE 0.9 % IV SOLN
INTRAVENOUS | Status: DC | PRN
  Administered 2020-11-03: 250 mL via INTRAVENOUS

## 2020-11-03 MED ORDER — FREE WATER
100.0000 mL | Status: DC
  Administered 2020-11-03 – 2020-11-06 (×18): 100 mL

## 2020-11-03 NOTE — Progress Notes (Addendum)
PROGRESS NOTE  Daniel [name] RDE:[id number] DOB: 30-Dec-1940 DOA: 11/02/2020 PCP: [name], MD  HPI/Recap of past 24 hours: This is a 78 year old male with a past medical history of esophageal cancer with Peg tube in place, hypertension, hyperlipidemia, A. fib s/p cardioversion on Multaq and Eliquis, NICM, HFpEF 50 to 55%(07/04/20), PAD who was brought in by EMS from home with complaints of tiredness, confusion with dark/cloudy urine for several days as well as a fall at home prior to arrival.  The patient has been very weak for several days with more confusion than usual.  Typically ambulates on his own but yesterday could only sit on the bed.  On the day of presentation, his son called EMS because he found the patient on the floor.  He held the patient back to the bed prior to EMS arrival.  Currently, the patient is able to recall these events somewhat and states that he was in bed reaching for his "spittoon" and he fell out of bed.  Reports a productive cough x1 month, denies any urinary complaints.  He was given 500 cc NS via EMS bolus prior to arrival.  Vitals per EMS: T 101.9 F, SPO2 83% on room air requiring NRB, BP 102/58, HR 107.  ED Course: T102.33F, hypotensive (down to 89/58) which improved with IV fluids per sepsis protocol, tachycardic, tachypneic with NRB.  Notable labs: BUN 38, creatinine 1.31 (previously 1.01 in September), albumin 2.7, lactic acid 1.8, WBC 19.3, Hb 9.7, INR 1.5, UA with nitrites and WBCs.  CXR: Right mid and lower lung airspace disease consistent with pneumonia, mets not excluded.  11/03/20:  Was seen and examined at his bedside in the AM.  He reported feeling better after starting IV antibiotics.  Reported a wet cough.  Assessment/Plan: Principal Problem:   Sepsis ([place]) Active Problems:   Essential hypertension   Atrial fibrillation status post cardioversion [facility])   Malignant neoplasm of esophagus (HCC)   Aspiration pneumonia (HCC)   Chronic  systolic CHF (congestive heart failure) (HCC)   Acute lower UTI   AMS (altered mental status)   AKI (acute kidney injury) ([place])   Hypercalcemia   Fall at home, initial encounter  Sepsis multifactorial secondary to aspiration pneumonia  Presented with fever 102.5, tachycardic, tachypneic, WBC 19,000, chest x-ray consistent with right-sided pneumonia with suspected aspiration. UA was positive for pyuria however urine culture was negative. He was started on Rocephin and azithromycin, continue Obtain sputum culture, follow cultures. Blood cultures no growth to date, continue to follow WBC is downtrending Procalcitonin greater than 8 Repeat CBC with differential and procalcitonin in the morning. Monitor fever curve and WBC.  Presumed aspiration pneumonia in the setting of dysphagia on chronic PEG tube feedings. Self-reported he gets 5-6 feedings per day.  Assisted by his son at home.  Currently does not have home health services.  States he was doing so well previously that he no longer required home health services. Started pulmonary toilet.  Dysphagia in the setting of esophageal cancer He is completely n.p.o. Completely relies on PEG tube feeding for nutrition Dietary consulted to assist with tube feedings Maintain head of bed above 35 degrees during feedings Aspiration precautions  Severe protein calorie malnutrition BMI 13 Severe muscle mass loss Dietary consulted and following.  Paroxysmal A. fib on Eliquis Currently in sinus rhythm Continue Eliquis Continue to monitor on telemetry.  Chronic microcytic anemia Presented with hemoglobin 9.7 and MCV of 101 Hemoglobin downtrending to 8.8 No overt bleeding Monitor H&H  Mild hyperkalemia Serum potassium 5.1 Continue to monitor serum potassium Repeat BMP in the morning.  Chronic diastolic CHF Last 2D echo done on July 04, 2020 showed LVEF 50 to 55% Euvolemic on exam Strict I's and O's and daily weight   Code Status:  DNR  Family Communication: None at bedside  Disposition Plan: Likely will discharge to home with home health services.   Consultants:  Dietary  Procedures:  None  Antimicrobials:  Rocephin  Azithromycin  DVT prophylaxis: Eliquis.  Status is: Inpatient    Dispo:  Patient From: Home  Planned Disposition: Home with Health Care Svc  Expected discharge date: 11/05/20  Medically stable for discharge: No, ongoing management of presumed aspiration pneumonia, currently on IV antibiotics.         Objective: Vitals:   11/02/20 2054 11/02/20 2104 11/03/20 0500 11/03/20 1308  BP:  121/63  129/62  Pulse:  63  85  Resp:  19    Temp:  97.6 F (36.4 C)  97.9 F (36.6 C)  TempSrc:    Oral  SpO2:  100%  96%  Weight: 43.5 kg  43.5 kg   Height: 5\' 10"  (1.778 m)       Intake/Output Summary (Last 24 hours) at 11/03/2020 1625 Last data filed at 11/03/2020 1331 Gross per 24 hour  Intake 175.76 ml  Output 975 ml  Net -799.24 ml   Filed Weights   11/02/20 1239 11/02/20 2054 11/03/20 0500  Weight: 47.6 kg 43.5 kg 43.5 kg    Exam:  . General: 79 y.o. year-old male severely emaciated, in no acute distress.  Alert and oriented x3. . Cardiovascular: Regular rate and rhythm with no rubs or gallops.  No thyromegaly or JVD noted.   Marland Kitchen Respiratory: Mild rales noted at bases.  No wheezing noted.  Good inspiratory effort. . Abdomen: Soft nontender nondistended with normal bowel sounds x4 quadrants. . Musculoskeletal: No lower extremity edema bilaterally.   Marland Kitchen Psychiatry: Mood is appropriate for condition and setting   Data Reviewed: CBC: Recent Labs  Lab 11/02/20 0744 11/03/20 0604  WBC 19.3* 11.0*  NEUTROABS 18.0*  --   HGB 9.7* 8.8*  HCT 29.8* 28.0*  MCV 101.7* 103.7*  PLT 427* 993   Basic Metabolic Panel: Recent Labs  Lab 11/02/20 0744 11/03/20 0604 11/03/20 1039  NA 137 138  --   K 4.8 5.1  --   CL 105 108  --   CO2 24 23  --   GLUCOSE 117* 98  --     BUN 38* 22  --   CREATININE 1.31* 0.90  --   CALCIUM 10.5* 9.8  --   MG  --   --  2.3  PHOS  --   --  2.5   GFR: Estimated Creatinine Clearance: 40.9 mL/min (by C-G formula based on SCr of 0.9 mg/dL). Liver Function Tests: Recent Labs  Lab 11/02/20 0744  AST 31  ALT 33  ALKPHOS 74  BILITOT 0.3  PROT 7.0  ALBUMIN 2.7*   No results for input(s): LIPASE, AMYLASE in the last 168 hours. No results for input(s): AMMONIA in the last 168 hours. Coagulation Profile: Recent Labs  Lab 11/02/20 0744  INR 1.5*   Cardiac Enzymes: No results for input(s): CKTOTAL, CKMB, CKMBINDEX, TROPONINI in the last 168 hours. BNP (last 3 results) No results for input(s): PROBNP in the last 8760 hours. HbA1C: No results for input(s): HGBA1C in the last 72 hours. CBG: No results for input(s): GLUCAP in  the last 168 hours. Lipid Profile: No results for input(s): CHOL, HDL, LDLCALC, TRIG, CHOLHDL, LDLDIRECT in the last 72 hours. Thyroid Function Tests: No results for input(s): TSH, T4TOTAL, FREET4, T3FREE, THYROIDAB in the last 72 hours. Anemia Panel: No results for input(s): VITAMINB12, FOLATE, FERRITIN, TIBC, IRON, RETICCTPCT in the last 72 hours. Urine analysis:    Component Value Date/Time   COLORURINE YELLOW 11/02/2020 1249   APPEARANCEUR CLEAR 11/02/2020 1249   LABSPEC 1.017 11/02/2020 1249   PHURINE 7.0 11/02/2020 1249   GLUCOSEU NEGATIVE 11/02/2020 1249   HGBUR NEGATIVE 11/02/2020 1249   BILIRUBINUR NEGATIVE 11/02/2020 1249   KETONESUR NEGATIVE 11/02/2020 1249   PROTEINUR NEGATIVE 11/02/2020 1249   UROBILINOGEN 1.0 05/27/2010 1055   NITRITE POSITIVE (A) 11/02/2020 1249   LEUKOCYTESUR SMALL (A) 11/02/2020 1249   Sepsis Labs: @LABRCNTIP (procalcitonin:4,lacticidven:4)  ) Recent Results (from the past 240 hour(s))  Respiratory Panel by RT PCR (Flu A&B, Covid) - Nasopharyngeal Swab     Status: None   Collection Time: 11/02/20  7:44 AM   Specimen: Nasopharyngeal Swab  Result  Value Ref Range Status   SARS Coronavirus 2 by RT PCR NEGATIVE NEGATIVE Final    Comment: (NOTE) SARS-CoV-2 target nucleic acids are NOT DETECTED.  The SARS-CoV-2 RNA is generally detectable in upper respiratoy specimens during the acute phase of infection. The lowest concentration of SARS-CoV-2 viral copies this assay can detect is 131 copies/mL. A negative result does not preclude SARS-Cov-2 infection and should not be used as the sole basis for treatment or other patient management decisions. A negative result may occur with  improper specimen collection/handling, submission of specimen other than nasopharyngeal swab, presence of viral mutation(s) within the areas targeted by this assay, and inadequate number of viral copies (<131 copies/mL). A negative result must be combined with clinical observations, patient history, and epidemiological information. The expected result is Negative.  Fact Sheet for Patients:  PinkCheek.be  Fact Sheet for Healthcare Providers:  GravelBags.it  This test is no t yet approved or cleared by the [place] FDA and  has been authorized for detection and/or diagnosis of SARS-CoV-2 by FDA under an Emergency Use Authorization (EUA). This EUA will remain  in effect (meaning this test can be used) for the duration of the COVID-19 declaration under Section 564(b)(1) of the Act, 21 U.S.C. section 360bbb-3(b)(1), unless the authorization is terminated or revoked sooner.     Influenza A by PCR NEGATIVE NEGATIVE Final   Influenza B by PCR NEGATIVE NEGATIVE Final    Comment: (NOTE) The Xpert Xpress SARS-CoV-2/FLU/RSV assay is intended as an aid in  the diagnosis of influenza from Nasopharyngeal swab specimens and  should not be used as a sole basis for treatment. Nasal washings and  aspirates are unacceptable for Xpert Xpress SARS-CoV-2/FLU/RSV  testing.  Fact Sheet for  Patients: PinkCheek.be  Fact Sheet for Healthcare Providers: GravelBags.it  This test is not yet approved or cleared by the [place] FDA and  has been authorized for detection and/or diagnosis of SARS-CoV-2 by  FDA under an Emergency Use Authorization (EUA). This EUA will remain  in effect (meaning this test can be used) for the duration of the  Covid-19 declaration under Section 564(b)(1) of the Act, 21  U.S.C. section 360bbb-3(b)(1), unless the authorization is  terminated or revoked. Performed at [facility], [place] [address] [place], [place] [address]   Blood Culture (routine x 2)     Status: None (Preliminary result)   Collection Time: 11/02/20  7:44 AM   Specimen: BLOOD  Result Value Ref Range Status   Specimen Description   Final    BLOOD RIGHT ARM Performed at [place] [address] [place], [place] [address]    Special Requests   Final    BOTTLES DRAWN AEROBIC AND ANAEROBIC Blood Culture adequate volume Performed at [place] [address] [place], [place] [address]    Culture   Final    NO GROWTH 1 DAY Performed at [facility], [place] [address] [place], [place] [address]    Report Status PENDING  Incomplete  Urine culture     Status: None   Collection Time: 11/02/20 12:49 PM   Specimen: In/Out Cath Urine  Result Value Ref Range Status   Specimen Description   Final    IN/OUT CATH URINE Performed at [place] [address], [place], [place] [address]    Special Requests   Final    Normal Performed at [facility], [place] [address], [place], [place] [address]    Culture   Final    NO GROWTH Performed at [facility], [place] [address], [place], [place] [address]    Report Status 11/03/2020 FINAL  Final  MRSA PCR Screening     Status: None   Collection Time: 11/02/20 10:28 PM    Specimen: Nasopharyngeal  Result Value Ref Range Status   MRSA by PCR NEGATIVE NEGATIVE Final    Comment:        The GeneXpert MRSA Assay (FDA approved for NASAL specimens only), is one component of a comprehensive MRSA colonization surveillance program. It is not intended to diagnose MRSA infection nor to guide or monitor treatment for MRSA infections. Performed at [facility], [place] [address], [place], [place] [address]   Expectorated sputum assessment w rflx to resp cult     Status: None   Collection Time: 11/03/20  8:55 AM   Specimen: Sputum  Result Value Ref Range Status   Specimen Description SPU  Final   Special Requests SPU  Final   Sputum evaluation   Final    THIS SPECIMEN IS ACCEPTABLE FOR SPUTUM CULTURE Performed at [facility], [place] [address] [place], [place] [address]    Report Status 11/03/2020 FINAL  Final      Studies: No results found.  Scheduled Meds: . apixaban  5 mg Per Tube BID  . dronedarone  400 mg Per Tube BID WC  . guaiFENesin  10 mL Per Tube BID  . sodium chloride flush  3 mL Intravenous Q12H  . sodium chloride HYPERTONIC  4 mL Nebulization BID    Continuous Infusions: . sodium chloride 250 mL (11/03/20 0824)  . azithromycin 500 mg (11/03/20 1322)  . cefTRIAXone (ROCEPHIN)  IV 2 g (11/03/20 0827)  . feeding supplement (OSMOLITE 1.2 CAL) 1,000 mL (11/03/20 1350)     LOS: 1 day     Kayleen Memos, MD [facility] Pager [phone]  If 7PM-7AM, please contact night-coverage [email or web address] Password [facility] 11/03/2020, 4:25 PM

## 2020-11-03 NOTE — Progress Notes (Signed)
Brief Nutrition Note  Consult received for enteral/tube feeding initiation and management.  Adult Enteral Nutrition Protocol initiated. Full assessment to follow.  Admitting Dx: CAP (community acquired pneumonia) [J18.9] Urinary tract infection without hematuria, site unspecified [N39.0] Community acquired pneumonia of right lung, unspecified part of lung [J18.9] Sepsis with acute renal failure without septic shock, due to unspecified organism, unspecified acute renal failure type ([place]) [A41.9, R65.20, N17.9]  Body mass index is 13.76 kg/m. Pt meets criteria for underweight based on current BMI.  Labs:  Recent Labs  Lab 11/02/20 0744 11/03/20 0604  NA 137 138  K 4.8 5.1  CL 105 108  CO2 24 23  BUN 38* 22  CREATININE 1.31* 0.90  CALCIUM 10.5* 9.8  GLUCOSE 117* 98    Maricus Tanzi, MS, RD, LDN RD pager number and weekend/on-call pager number located in [place].

## 2020-11-03 NOTE — Progress Notes (Signed)
PT Cancellation Note  Patient Details Name: Daniel [name] MRN: [id number] DOB: Jun 06, 1941   Cancelled Treatment:    Reason Eval/Treat Not Completed: Other (comment). Pt states not today because he is "frustrated". Agreeable to to PT returning tomorrow or as schedule next allows.   [facility] 11/03/2020, 12:54 PM

## 2020-11-04 ENCOUNTER — Encounter (HOSPITAL_COMMUNITY): Payer: Self-pay | Admitting: Internal Medicine

## 2020-11-04 LAB — BASIC METABOLIC PANEL
Anion gap: 8 (ref 5–15)
BUN: 18 mg/dL (ref 8–23)
CO2: 21 mmol/L — ABNORMAL LOW (ref 22–32)
Calcium: 9.9 mg/dL (ref 8.9–10.3)
Chloride: 107 mmol/L (ref 98–111)
Creatinine, Ser: 0.86 mg/dL (ref 0.61–1.24)
GFR, Estimated: >60 mL/min (ref >60)
Glucose, Bld: 123 mg/dL — ABNORMAL HIGH (ref 70–99)
Potassium: 4.4 mmol/L (ref 3.5–5.1)
Sodium: 136 mmol/L (ref 135–145)

## 2020-11-04 LAB — MAGNESIUM
Magnesium: 2.2 mg/dL (ref 1.7–2.4)
Magnesium: 2.2 mg/dL (ref 1.7–2.4)

## 2020-11-04 LAB — PHOSPHORUS
Phosphorus: 2.3 mg/dL — ABNORMAL LOW (ref 2.5–4.6)
Phosphorus: 2.7 mg/dL (ref 2.5–4.6)

## 2020-11-04 LAB — GLUCOSE, CAPILLARY
Glucose-Capillary: 108 mg/dL — ABNORMAL HIGH (ref 70–99)
Glucose-Capillary: 111 mg/dL — ABNORMAL HIGH (ref 70–99)
Glucose-Capillary: 123 mg/dL — ABNORMAL HIGH (ref 70–99)
Glucose-Capillary: 125 mg/dL — ABNORMAL HIGH (ref 70–99)
Glucose-Capillary: 126 mg/dL — ABNORMAL HIGH (ref 70–99)
Glucose-Capillary: 132 mg/dL — ABNORMAL HIGH (ref 70–99)
Glucose-Capillary: 86 mg/dL (ref 70–99)

## 2020-11-04 LAB — CBC WITH DIFFERENTIAL/PLATELET
Abs Immature Granulocytes: 0.03 10*3/uL (ref 0.00–0.07)
Basophils Absolute: 0 10*3/uL (ref 0.0–0.1)
Basophils Relative: 0 %
Eosinophils Absolute: 0 10*3/uL (ref 0.0–0.5)
Eosinophils Relative: 0 %
HCT: 27.6 % — ABNORMAL LOW (ref 39.0–52.0)
Hemoglobin: 8.8 g/dL — ABNORMAL LOW (ref 13.0–17.0)
Immature Granulocytes: 0 %
Lymphocytes Relative: 11 %
Lymphs Abs: 0.8 10*3/uL (ref 0.7–4.0)
MCH: 32.6 pg (ref 26.0–34.0)
MCHC: 31.9 g/dL (ref 30.0–36.0)
MCV: 102.2 fL — ABNORMAL HIGH (ref 80.0–100.0)
Monocytes Absolute: 0.4 10*3/uL (ref 0.1–1.0)
Monocytes Relative: 5 %
Neutro Abs: 6.5 10*3/uL (ref 1.7–7.7)
Neutrophils Relative %: 84 %
Platelets: 416 10*3/uL — ABNORMAL HIGH (ref 150–400)
RBC: 2.7 MIL/uL — ABNORMAL LOW (ref 4.22–5.81)
RDW: 12.2 % (ref 11.5–15.5)
WBC: 7.8 10*3/uL (ref 4.0–10.5)
nRBC: 0 % (ref 0.0–0.2)

## 2020-11-04 LAB — PROCALCITONIN: Procalcitonin: 4.87 ng/mL

## 2020-11-04 MED ORDER — OSMOLITE 1.5 CAL PO LIQD
1000.0000 mL | ORAL | Status: DC
  Administered 2020-11-04 – 2020-11-05 (×2): 1000 mL
  Filled 2020-11-04 (×3): qty 1000

## 2020-11-04 MED ORDER — PROSOURCE TF PO LIQD
45.0000 mL | Freq: Two times a day (BID) | ORAL | Status: DC
  Administered 2020-11-04 – 2020-11-06 (×4): 45 mL
  Filled 2020-11-04 (×5): qty 45

## 2020-11-04 NOTE — Progress Notes (Signed)
Initial Nutrition Assessment  DOCUMENTATION CODES:   Severe malnutrition in context of chronic illness, Underweight  INTERVENTION:  - will adjust TF regimen: Osmolite 1.5 @ 50 ml/hr with 45 ml Prosource TF BID and 100 ml free water every 4 hours.  - this regimen will provide 1880 kcal, 97 grams protein, and 1514 ml free water.    NUTRITION DIAGNOSIS:   Severe Malnutrition related to chronic illness, cancer and cancer related treatments as evidenced by moderate fat depletion, moderate muscle depletion, severe fat depletion, severe muscle depletion.  GOAL:   Patient will meet greater than or equal to 90% of their needs  MONITOR:   TF tolerance, Labs, Weight trends  REASON FOR ASSESSMENT:   Consult Enteral/tube feeding initiation and management  ASSESSMENT:   79 year old male with medical history of esophageal cancer s/p PEG, HTN, HLD, A. fib s/p cardioversion on Multaq and Eliquis, NICM, HFpEF, and PAD. He presented to the ED via EMS from home with complaints of tiredness, severe weakness, confusion, cough x1 month, and dark/cloudy urine for several days. He also experienced a fall at home PTA. He usually ambulates independently, but the day prior to presentation, he could only sit on the bed.  Patient has been NPO since admission. Patient sleeping at the time of visit and did not awake to name call x2 and then name call x4 during NFPE. No family/visitors present.   Patient was last assessed by a [place] RD on 07/09/20 at which time he was switched from continuous to bolus TF of Osmolite 1.5.  He is currently receiving Osmolite 1.2 @ 50 ml/hr with 100 ml free water every 4 hours. This regimen is providing 1440 kcal, 67 grams protein, and 1584 ml free water.  Weight today is 99 lb and weight yesterday was 96 lb. Weight on 10/7 was 94 lb and weight on 09/02/20 was 100 lb.   Per notes: - sepsis 2/2 aspiration PNA - reported he gets 5-6 bolus TF feedings/day and is assisted by  his son at home - dysphagia 2/2 esophageal cancer - severe protein calorie malnutrition   Labs reviewed; CBGs: 126, 125, 132, 123 mg/dl, Phos: 2.3 mg/dl. Medications reviewed.    NUTRITION - FOCUSED PHYSICAL EXAM:    Most Recent Value  Orbital Region Moderate depletion  Upper Arm Region Severe depletion  Thoracic and Lumbar Region Unable to assess  Buccal Region Severe depletion  Temple Region Moderate depletion  Clavicle Bone Region Severe depletion  Clavicle and Acromion Bone Region Severe depletion  Scapular Bone Region Unable to assess  Dorsal Hand Unable to assess  Patellar Region Severe depletion  Anterior Thigh Region Severe depletion  Posterior Calf Region Severe depletion  Edema (RD Assessment) None  Hair Reviewed  Eyes Unable to assess  Mouth Unable to assess  Skin Reviewed  Nails Unable to assess       Diet Order:   Diet Order            Diet NPO time specified  Diet effective now                 EDUCATION NEEDS:   No education needs have been identified at this time  Skin:  Skin Assessment: Reviewed RN Assessment  Last BM:  11/15 (type 5)  Height:   Ht Readings from Last 1 Encounters:  11/02/20 5\' 10"  (1.778 m)    Weight:   Wt Readings from Last 1 Encounters:  11/04/20 45 kg     Estimated Nutritional  Needs:  Kcal:  1710-1900 kcal Protein:  85-100 grams Fluid:  >/= 2 L/day     Jarome Matin, MS, RD, LDN, CNSC Inpatient Clinical Dietitian RD pager # available in AMION  After hours/weekend pager # available in [facility]

## 2020-11-04 NOTE — Progress Notes (Signed)
PROGRESS NOTE  Daniel [name] WVP:[id number] DOB: 05/30/1941 DOA: 11/02/2020 PCP: [name], MD  HPI/Recap of past 24 hours: This is a 79 year old male with a past medical history of esophageal cancer with Peg tube in place, hypertension, hyperlipidemia, A. fib s/p cardioversion on Multaq and Eliquis, NICM, HFpEF 50 to 55%(07/04/20), PAD who was brought in by EMS from home with complaints of tiredness, confusion with dark/cloudy urine for several days as well as a fall at home prior to arrival.  The patient has been very weak for several days with more confusion than usual.  Typically ambulates on his own but yesterday could only sit on the bed.  On the day of presentation, his son called EMS because he found the patient on the floor.  He held the patient back to the bed prior to EMS arrival.  Currently, the patient is able to recall these events somewhat and states that he was in bed reaching for his "spittoon" and he fell out of bed.  Reports a productive cough x1 month, denies any urinary complaints.  He was given 500 cc NS via EMS bolus prior to arrival.  Vitals per EMS: T 101.9 F, SPO2 83% on room air requiring NRB, BP 102/58, HR 107.  ED Course: T102.58F, hypotensive (down to 89/58) which improved with IV fluids per sepsis protocol, tachycardic, tachypneic with NRB.  Notable labs: BUN 38, creatinine 1.31 (previously 1.01 in September), albumin 2.7, lactic acid 1.8, WBC 19.3, Hb 9.7, INR 1.5, UA with nitrites and WBCs.  CXR: Right mid and lower lung airspace disease consistent with pneumonia, mets not excluded.  11/04/20:  Seen and examined.  Reports feeling tired this AM, would not elaborate.  Head of bed at 37 degrees.  No distress.  Procalcitonin down trending to 4 from 8, but still elevated.  C/w IV antibiotics.   Assessment/Plan: Principal Problem:   Sepsis ([place]) Active Problems:   Essential hypertension   Atrial fibrillation status post cardioversion [facility])   Malignant  neoplasm of esophagus (HCC)   Aspiration pneumonia (HCC)   Chronic systolic CHF (congestive heart failure) (HCC)   Acute lower UTI   AMS (altered mental status)   AKI (acute kidney injury) ([place])   Hypercalcemia   Fall at home, initial encounter  Sepsis, improving, secondary to aspiration pneumonia, CAP, poa  Presented with fever 102.5, tachycardic, tachypneic, WBC 19,000, chest x-ray consistent with right-sided pneumonia with suspected aspiration. UA was positive for pyuria however urine culture was negative. He was started on Rocephin and azithromycin, continue Sputum culture, follow: RARE WBC PRESENT,BOTH PMN AND MONONUCLEAR  RARE GRAM POSITIVE COCCI IN PAIRS  RARE GRAM NEGATIVE RODS  Blood cultures no growth to date, continue to follow WBC is downtrending, 7.8K from 19K Procalcitonin 4 from greater than 8 Repeat CBC with differential and procalcitonin in the morning. Monitor fever curve and WBC.  Presumed aspiration pneumonia in the setting of dysphagia on chronic PEG tube feedings. Self-reported he gets 5-6 feedings per day.  Assisted by his son at home.  Currently does not have home health services.  States he was doing so well previously that he no longer required home health services. C/w pulmonary toilet.  Dysphagia in the setting of esophageal cancer He is completely n.p.o. Completely relies on PEG tube feeding for nutrition Dietary consulted to assist with tube feedings and nutritional goals Maintain head of bed above 35 degrees during feedings Continue Aspiration precautions  Severe protein calorie malnutrition BMI 13 Severe muscle mass loss  Dietary consulted and following.  Paroxysmal A. fib on Eliquis Currently in sinus rhythm Continue Eliquis Continue to monitor on telemetry.  Chronic macrocytic anemia Presented with hemoglobin 9.7 and MCV of 101 Hemoglobin plateaud 8.8 from 8.8 No overt bleeding Monitor H&H  Resolved Mild hyperkalemia Serum potassium  5.1>> 4.4 Continue to monitor serum potassium Repeat BMP in the morning.  Chronic diastolic CHF Last 2D echo done on July 04, 2020 showed LVEF 50 to 55% Euvolemic on exam Strict I's and O's and daily weight  Ambulatory dysfunction PT recs Home health PT TOC assisting with home health services arrangements C/w fall precautions  Code Status: DNR  Family Communication: None at bedside  Disposition Plan: Likely will discharge to home with home health services.   Consultants:  Dietary  Procedures:  None  Antimicrobials:  Rocephin  Azithromycin  DVT prophylaxis: Eliquis.  Status is: Inpatient    Dispo:  Patient From: Home  Planned Disposition: Home  Expected discharge date: 11/06/20  Medically stable for discharge: No, ongoing management of CAP and presumed aspiration pneumonia, currently on IV antibiotics.         Objective: Vitals:   11/03/20 1810 11/03/20 2048 11/04/20 0500 11/04/20 1431  BP:  118/62  (!) 114/58  Pulse:  83  79  Resp:  19  18  Temp:  97.9 F (36.6 C)  98 F (36.7 C)  TempSrc:  Oral  Oral  SpO2: 96% 98%  99%  Weight:   45 kg   Height:        Intake/Output Summary (Last 24 hours) at 11/04/2020 1716 Last data filed at 11/04/2020 1700 Gross per 24 hour  Intake 2211.61 ml  Output 575 ml  Net 1636.61 ml   Filed Weights   11/02/20 2054 11/03/20 0500 11/04/20 0500  Weight: 43.5 kg 43.5 kg 45 kg    Exam:  . General: 79 y.o. year-old male Severely emaciated.  In NAD, A&Ox 3. . Cardiovascular: RRR no rubs or gallops. Marland Kitchen Respiratory: Mild rales at bases.  No wheezes noted. . Abdomen: Soft NT NBS peg tube in place. . Musculoskeletal: No LE edema bilaterally.   Marland Kitchen Psychiatry: Mood is appropriate   Data Reviewed: CBC: Recent Labs  Lab 11/02/20 0744 11/03/20 0604 11/04/20 0459  WBC 19.3* 11.0* 7.8  NEUTROABS 18.0*  --  6.5  HGB 9.7* 8.8* 8.8*  HCT 29.8* 28.0* 27.6*  MCV 101.7* 103.7* 102.2*  PLT 427* 390 416*    Basic Metabolic Panel: Recent Labs  Lab 11/02/20 0744 11/03/20 0604 11/03/20 1039 11/03/20 1645 11/04/20 0459 11/04/20 0519 11/04/20 1619  NA 137 138  --   --   --  136  --   K 4.8 5.1  --   --   --  4.4  --   CL 105 108  --   --   --  107  --   CO2 24 23  --   --   --  21*  --   GLUCOSE 117* 98  --   --   --  123*  --   BUN 38* 22  --   --   --  18  --   CREATININE 1.31* 0.90  --   --   --  0.86  --   CALCIUM 10.5* 9.8  --   --   --  9.9  --   MG  --   --  2.3 2.2 2.2  --  2.2  PHOS  --   --  2.5 2.5 2.3*  --  2.7   GFR: Estimated Creatinine Clearance: 44.3 mL/min (by C-G formula based on SCr of 0.86 mg/dL). Liver Function Tests: Recent Labs  Lab 11/02/20 0744  AST 31  ALT 33  ALKPHOS 74  BILITOT 0.3  PROT 7.0  ALBUMIN 2.7*   No results for input(s): LIPASE, AMYLASE in the last 168 hours. No results for input(s): AMMONIA in the last 168 hours. Coagulation Profile: Recent Labs  Lab 11/02/20 0744  INR 1.5*   Cardiac Enzymes: No results for input(s): CKTOTAL, CKMB, CKMBINDEX, TROPONINI in the last 168 hours. BNP (last 3 results) No results for input(s): PROBNP in the last 8760 hours. HbA1C: No results for input(s): HGBA1C in the last 72 hours. CBG: Recent Labs  Lab 11/04/20 0037 11/04/20 0405 11/04/20 0726 11/04/20 1146 11/04/20 1605  GLUCAP 126* 125* 132* 123* 86   Lipid Profile: No results for input(s): CHOL, HDL, LDLCALC, TRIG, CHOLHDL, LDLDIRECT in the last 72 hours. Thyroid Function Tests: No results for input(s): TSH, T4TOTAL, FREET4, T3FREE, THYROIDAB in the last 72 hours. Anemia Panel: No results for input(s): VITAMINB12, FOLATE, FERRITIN, TIBC, IRON, RETICCTPCT in the last 72 hours. Urine analysis:    Component Value Date/Time   COLORURINE YELLOW 11/02/2020 1249   APPEARANCEUR CLEAR 11/02/2020 1249   LABSPEC 1.017 11/02/2020 1249   PHURINE 7.0 11/02/2020 1249   GLUCOSEU NEGATIVE 11/02/2020 1249   HGBUR NEGATIVE 11/02/2020 1249    BILIRUBINUR NEGATIVE 11/02/2020 1249   KETONESUR NEGATIVE 11/02/2020 1249   PROTEINUR NEGATIVE 11/02/2020 1249   UROBILINOGEN 1.0 05/27/2010 1055   NITRITE POSITIVE (A) 11/02/2020 1249   LEUKOCYTESUR SMALL (A) 11/02/2020 1249   Sepsis Labs: @LABRCNTIP (procalcitonin:4,lacticidven:4)  ) Recent Results (from the past 240 hour(s))  Respiratory Panel by RT PCR (Flu A&B, Covid) - Nasopharyngeal Swab     Status: None   Collection Time: 11/02/20  7:44 AM   Specimen: Nasopharyngeal Swab  Result Value Ref Range Status   SARS Coronavirus 2 by RT PCR NEGATIVE NEGATIVE Final    Comment: (NOTE) SARS-CoV-2 target nucleic acids are NOT DETECTED.  The SARS-CoV-2 RNA is generally detectable in upper respiratoy specimens during the acute phase of infection. The lowest concentration of SARS-CoV-2 viral copies this assay can detect is 131 copies/mL. A negative result does not preclude SARS-Cov-2 infection and should not be used as the sole basis for treatment or other patient management decisions. A negative result may occur with  improper specimen collection/handling, submission of specimen other than nasopharyngeal swab, presence of viral mutation(s) within the areas targeted by this assay, and inadequate number of viral copies (<131 copies/mL). A negative result must be combined with clinical observations, patient history, and epidemiological information. The expected result is Negative.  Fact Sheet for Patients:  PinkCheek.be  Fact Sheet for Healthcare Providers:  GravelBags.it  This test is no t yet approved or cleared by the [place] FDA and  has been authorized for detection and/or diagnosis of SARS-CoV-2 by FDA under an Emergency Use Authorization (EUA). This EUA will remain  in effect (meaning this test can be used) for the duration of the COVID-19 declaration under Section 564(b)(1) of the Act, 21 U.S.C. section  360bbb-3(b)(1), unless the authorization is terminated or revoked sooner.     Influenza A by PCR NEGATIVE NEGATIVE Final   Influenza B by PCR NEGATIVE NEGATIVE Final    Comment: (NOTE) The Xpert Xpress SARS-CoV-2/FLU/RSV assay is intended as an aid in  the diagnosis of influenza from Nasopharyngeal  swab specimens and  should not be used as a sole basis for treatment. Nasal washings and  aspirates are unacceptable for Xpert Xpress SARS-CoV-2/FLU/RSV  testing.  Fact Sheet for Patients: PinkCheek.be  Fact Sheet for Healthcare Providers: GravelBags.it  This test is not yet approved or cleared by the [place] FDA and  has been authorized for detection and/or diagnosis of SARS-CoV-2 by  FDA under an Emergency Use Authorization (EUA). This EUA will remain  in effect (meaning this test can be used) for the duration of the  Covid-19 declaration under Section 564(b)(1) of the Act, 21  U.S.C. section 360bbb-3(b)(1), unless the authorization is  terminated or revoked. Performed at [facility], [place] [address] [place], [place] [address]   Blood Culture (routine x 2)     Status: None (Preliminary result)   Collection Time: 11/02/20  7:44 AM   Specimen: BLOOD  Result Value Ref Range Status   Specimen Description   Final    BLOOD RIGHT ARM Performed at [facility] [address], [place], [place] [address]    Special Requests   Final    BOTTLES DRAWN AEROBIC AND ANAEROBIC Blood Culture adequate volume Performed at [facility] [address], [place], [place] [address]    Culture   Final    NO GROWTH 2 DAYS Performed at [facility] [address] [place], [place] [address]    Report Status PENDING  Incomplete  Urine culture     Status: None   Collection Time: 11/02/20 12:49 PM   Specimen: In/Out Cath Urine  Result Value Ref Range Status   Specimen  Description   Final    IN/OUT CATH URINE Performed at [place] [address] [place], [place] [address]    Special Requests   Final    Normal Performed at [facility], [place] [address] [place], [place] [address]    Culture   Final    NO GROWTH Performed at [facility], [place] [address] [place], [place] [address]    Report Status 11/03/2020 FINAL  Final  MRSA PCR Screening     Status: None   Collection Time: 11/02/20 10:28 PM   Specimen: Nasopharyngeal  Result Value Ref Range Status   MRSA by PCR NEGATIVE NEGATIVE Final    Comment:        The GeneXpert MRSA Assay (FDA approved for NASAL specimens only), is one component of a comprehensive MRSA colonization surveillance program. It is not intended to diagnose MRSA infection nor to guide or monitor treatment for MRSA infections. Performed at [facility], [place] [address] [place], [place] [address]   Expectorated sputum assessment w rflx to resp cult     Status: None   Collection Time: 11/03/20  8:55 AM   Specimen: Sputum  Result Value Ref Range Status   Specimen Description SPU  Final   Special Requests SPU  Final   Sputum evaluation   Final    THIS SPECIMEN IS ACCEPTABLE FOR SPUTUM CULTURE Performed at [facility], [place] [address] [place],  [address]    Report Status 11/03/2020 FINAL  Final  Culture, respiratory     Status: None (Preliminary result)   Collection Time: 11/03/20  8:55 AM   Specimen: Sputum  Result Value Ref Range Status   Specimen Description   Final    SPU Performed at [facility]  [address] [place], [place] [address]    Special Requests   Final    SPU Reflexed from [id number] Performed at [facility], [place] [address], [place], [place] [address]    Gram Stain   Final    RARE WBC PRESENT,BOTH PMN AND MONONUCLEAR RARE GRAM POSITIVE COCCI IN PAIRS RARE GRAM  NEGATIVE RODS    Culture   Final    CULTURE REINCUBATED FOR BETTER GROWTH Performed at [facility], [place] [address] [place], [place] [address]    Report Status PENDING  Incomplete      Studies: No results found.  Scheduled Meds: . apixaban  5 mg Per Tube BID  . dronedarone  400 mg Per Tube BID WC  . feeding supplement (PROSource TF)  45 mL Per Tube BID  . free water  100 mL Per Tube Q4H  . guaiFENesin  10 mL Per Tube BID  . sodium chloride flush  3 mL Intravenous Q12H  . sodium chloride HYPERTONIC  4 mL Nebulization BID    Continuous Infusions: . sodium chloride Stopped (11/04/20 1420)  . azithromycin 250 mL/hr at 11/04/20 1500  . cefTRIAXone (ROCEPHIN)  IV Stopped (11/04/20 0933)  . feeding supplement (OSMOLITE 1.5 CAL)       LOS: 2 days     Kayleen Memos, MD [facility] Pager [phone]  If 7PM-7AM, please contact night-coverage [email or web address] Password [facility] 11/04/2020, 5:16 PM

## 2020-11-04 NOTE — Evaluation (Signed)
Physical Therapy Evaluation Patient Details Name: Daniel [name] MRN: [id number] DOB: 1941/11/03 Today's Date: 11/04/2020   History of Present Illness  79 yo male admitted with sepsis, fall, asp pna. Hx of esophageal ca, Afib, HF, PAD, dysphagia  Clinical Impression  On eval, pt was Min guard assist for mobility. He walked `75 feet around the unit. Mildly unsteady. Pt adamantly states he doesn't need anything to walk. He tolerated activity well. Will recommend HHPT f/u if pt is agreeable. Will continue to follow.     Follow Up Recommendations Home health PT    Equipment Recommendations  None recommended by PT    Recommendations for Other Services       Precautions / Restrictions Precautions Precautions: Fall Precaution Comments: PEG Restrictions Weight Bearing Restrictions: No      Mobility  Bed Mobility Overal bed mobility: Modified Independent                  Transfers Overall transfer level: Modified independent                  Ambulation/Gait Ambulation/Gait assistance: Min guard Gait Distance (Feet): 75 Feet Assistive device: None Gait Pattern/deviations: Step-through pattern;Decreased stride length     General Gait Details: Min guard for safety. Intemittent unsteadiness.  Stairs            Wheelchair Mobility    Modified Rankin (Stroke Patients Only)       Balance Overall balance assessment: Needs assistance           Standing balance-Leahy Scale: Fair                               Pertinent Vitals/Pain Pain Assessment: No/denies pain    Home Living Family/patient expects to be discharged to:: Private residence Living Arrangements: Children Available Help at Discharge: Family;Available PRN/intermittently Type of Home: Independent living facility Home Access: Elevator     Home Layout: One level Home Equipment: None      Prior Function Level of Independence: Independent               Hand  Dominance        Extremity/Trunk Assessment   Upper Extremity Assessment Upper Extremity Assessment: Overall WFL for tasks assessed    Lower Extremity Assessment Lower Extremity Assessment: Generalized weakness    Cervical / Trunk Assessment Cervical / Trunk Assessment: Normal  Communication   Communication: No difficulties  Cognition Arousal/Alertness: Awake/alert Behavior During Therapy: WFL for tasks assessed/performed Overall Cognitive Status: Within Functional Limits for tasks assessed                                        General Comments      Exercises     Assessment/Plan    PT Assessment Patient needs continued PT services  PT Problem List Decreased mobility       PT Treatment Interventions Gait training;Therapeutic activities;Patient/family education;Therapeutic exercise;Balance training;Functional mobility training    PT Goals (Current goals can be found in the Care Plan section)  Acute Rehab PT Goals Patient Stated Goal: home PT Goal Formulation: With patient Time For Goal Achievement: 11/18/20 Potential to Achieve Goals: Good    Frequency Min 3X/week   Barriers to discharge        Co-evaluation  AM-PAC PT "6 Clicks" Mobility  Outcome Measure Help needed turning from your back to your side while in a flat bed without using bedrails?: None Help needed moving from lying on your back to sitting on the side of a flat bed without using bedrails?: None Help needed moving to and from a bed to a chair (including a wheelchair)?: A Little Help needed standing up from a chair using your arms (e.g., wheelchair or bedside chair)?: A Little Help needed to walk in hospital room?: A Little Help needed climbing 3-5 steps with a railing? : A Little 6 Click Score: 20    End of Session Equipment Utilized During Treatment: Gait belt Activity Tolerance: Patient tolerated treatment well Patient left: with call bell/phone within  reach;with bed alarm set;in bed   PT Visit Diagnosis: Unsteadiness on feet (R26.81)    Time: 1027-2536 PT Time Calculation (min) (ACUTE ONLY): 8 min   Charges:   PT Evaluation $PT Eval Low Complexity: [place], PT Acute Rehabilitation  Office: [phone] Pager: [phone]

## 2020-11-05 LAB — CBC WITH DIFFERENTIAL/PLATELET
Abs Immature Granulocytes: 0.03 10*3/uL (ref 0.00–0.07)
Basophils Absolute: 0 10*3/uL (ref 0.0–0.1)
Basophils Relative: 1 %
Eosinophils Absolute: 0.1 10*3/uL (ref 0.0–0.5)
Eosinophils Relative: 1 %
HCT: 27.6 % — ABNORMAL LOW (ref 39.0–52.0)
Hemoglobin: 8.6 g/dL — ABNORMAL LOW (ref 13.0–17.0)
Immature Granulocytes: 1 %
Lymphocytes Relative: 16 %
Lymphs Abs: 1 10*3/uL (ref 0.7–4.0)
MCH: 32.7 pg (ref 26.0–34.0)
MCHC: 31.2 g/dL (ref 30.0–36.0)
MCV: 104.9 fL — ABNORMAL HIGH (ref 80.0–100.0)
Monocytes Absolute: 0.4 10*3/uL (ref 0.1–1.0)
Monocytes Relative: 7 %
Neutro Abs: 4.6 10*3/uL (ref 1.7–7.7)
Neutrophils Relative %: 74 %
Platelets: 424 10*3/uL — ABNORMAL HIGH (ref 150–400)
RBC: 2.63 MIL/uL — ABNORMAL LOW (ref 4.22–5.81)
RDW: 12.3 % (ref 11.5–15.5)
WBC: 6.1 10*3/uL (ref 4.0–10.5)
nRBC: 0 % (ref 0.0–0.2)

## 2020-11-05 LAB — GLUCOSE, CAPILLARY
Glucose-Capillary: 98 mg/dL (ref 70–99)
Glucose-Capillary: 115 mg/dL — ABNORMAL HIGH (ref 70–99)
Glucose-Capillary: 98 mg/dL (ref 70–99)
Glucose-Capillary: 78 mg/dL (ref 70–99)

## 2020-11-05 LAB — BASIC METABOLIC PANEL
Anion gap: 6 (ref 5–15)
BUN: 18 mg/dL (ref 8–23)
CO2: 22 mmol/L (ref 22–32)
Calcium: 9.6 mg/dL (ref 8.9–10.3)
Chloride: 105 mmol/L (ref 98–111)
Creatinine, Ser: 0.92 mg/dL (ref 0.61–1.24)
GFR, Estimated: >60 mL/min (ref >60)
Glucose, Bld: 96 mg/dL (ref 70–99)
Potassium: 4.6 mmol/L (ref 3.5–5.1)
Sodium: 133 mmol/L — ABNORMAL LOW (ref 135–145)

## 2020-11-05 LAB — PROCALCITONIN: Procalcitonin: 2.43 ng/mL

## 2020-11-05 MED ORDER — AZITHROMYCIN 250 MG PO TABS
500.0000 mg | ORAL_TABLET | Freq: Every day | ORAL | Status: AC
  Administered 2020-11-05 – 2020-11-06 (×2): 500 mg
  Filled 2020-11-05 (×2): qty 2

## 2020-11-05 NOTE — Progress Notes (Signed)
Patients CBG 78 this evening.  MD notified, no new orders at this time, just continue tube feed.  Will continue to monitor - patient asymptomatic at this time

## 2020-11-05 NOTE — Progress Notes (Addendum)
Pharmacy IV to PO   This patient is receiving the antibiotic(s) azithromycin by the intravenous route. Based on criteria approved by the Pharmacy and Therapeutics Committee, and the Infectious Disease Division, the antibiotic(s) is / are being converted to equivalent oral dose form(s). These criteria include:   Patient being treated for a respiratory tract infection, urinary tract infection, cellulitis, or Clostridium Difficile Associated Diarrhea .   The patient is not neutropenic and does not exhibit a GI malabsorption state   The patient is eating (either orally or per tube) and/or has been taking other orally administered medications for at least 24 hours.   The patient is improving clinically (physician assessment and a 24-hour Tmax of  100.5 F)     If you have questions about this conversion, please contact the pharmacy department. Thank you.     Jimmy Footman, PharmD, BCPS, BCIDP Infectious Diseases Clinical Pharmacist Phone: [phone] 11/05/2020 10:31 AM

## 2020-11-05 NOTE — Progress Notes (Signed)
PROGRESS NOTE  Daniel [name] UJW:[id number] DOB: 1941/08/29 DOA: 11/02/2020 PCP: [name], MD  HPI/Recap of past 24 hours: This is a 79 year old male with a past medical history of esophageal cancer with Peg tube feeding dependence, hypertension, hyperlipidemia, A. fib s/p cardioversion on Multaq and Eliquis, NICM, HFpEF 50 to 55%(07/04/20), PAD who was brought in by EMS from home with complaints of tiredness, confusion with dark/cloudy urine for several days as well as a fall at home prior to arrival.  The patient has been very weak for several days with more confusion than usual.  Typically ambulates on his own but yesterday could only sit on the bed.  On the day of presentation, his son called EMS because he found the patient on the floor.  He held the patient back to the bed prior to EMS arrival.  Currently, the patient is able to recall these events somewhat and states that he was in bed reaching for his "spittoon" and he fell out of bed.  Reports a productive cough x1 month, denies any urinary complaints.  He was given 500 cc NS via EMS bolus prior to arrival.  Vitals per EMS: T 101.9 F, SPO2 83% on room air requiring NRB, BP 102/58, HR 107.  ED Course: T102.82F, hypotensive (down to 89/58) which improved with IV fluids per sepsis protocol, tachycardic, tachypneic with NRB.  Notable labs: BUN 38, creatinine 1.31 (previously 1.01 in September), albumin 2.7, lactic acid 1.8, WBC 19.3, Hb 9.7, INR 1.5, UA with nitrites and WBCs.  CXR: Right mid and lower lung airspace disease consistent with pneumonia, mets not excluded.  11/06/20:  Seen and examined.  He feels better this AM.  He has declined HHPT or any home health services at discharge.  He states that he son takes care of all of it for him.  Assessment/Plan: Principal Problem:   Sepsis ([place]) Active Problems:   Essential hypertension   Atrial fibrillation status post cardioversion [facility])   Malignant neoplasm of esophagus  (HCC)   Aspiration pneumonia (HCC)   Chronic systolic CHF (congestive heart failure) (HCC)   Acute lower UTI   AMS (altered mental status)   AKI (acute kidney injury) ([place])   Hypercalcemia   Fall at home, initial encounter  Sepsis, improving, secondary to aspiration pneumonia, CAP, poa  Presented with fever 102.5, tachycardic, tachypneic, WBC 19,000, chest x-ray consistent with right-sided pneumonia with suspected aspiration. UA was positive for pyuria however urine culture was negative. He was started on Rocephin and azithromycin, continue Sputum culture, follow:  RARE STAPHYLOCOCCUS AUREUS  SUSCEPTIBILITIES TO FOLLOW -called micro at [facility] and microbiologist reported that sensitivities will be back tomorrow 11/06/20. Blood cultures no growth to date, continue to follow MRSA screening negative 11/02/20. WBC is downtrending, 6.1K from 7.8K from 19K Procalcitonin 2 from 4 from greater than 8 Repeat procalcitonin in the AM and follow cultures Narrow down to po abx according to cx results on 11/17. Plan to DC tomorrow to home.  He declined HHS.  Presumed aspiration pneumonia in the setting of dysphagia on chronic PEG tube feedings. Self-reported he gets 5-6 feedings per day.  Assisted by his son at home.  Currently does not have home health services.  States he was doing so well previously that he no longer required home health services. C/w pulmonary toilet.  Dysphagia in the setting of esophageal cancer He is completely n.p.o. Completely relies on PEG tube feeding for nutrition Dietary consulted to assist with tube feedings and nutritional  goals Maintain head of bed above 35 degrees during feedings Continue Aspiration precautions  Severe protein calorie malnutrition BMI 13 Severe muscle mass loss Dietary consulted and following. Continue appetite stimulant from home via peg tube.  Paroxysmal A. fib on Eliquis Currently in sinus rhythm Continue Eliquis Continue to  monitor on telemetry.  Chronic macrocytic anemia Presented with hemoglobin 9.7 and MCV of 101 Hemoglobin plateaud 8.8 from 8.8 No overt bleeding Monitor H&H  Resolved Mild hyperkalemia Serum potassium 5.1>> 4.4>> 4.6 Continue to monitor serum potassium Repeat BMP in the morning.  Chronic diastolic CHF Last 2D echo done on July 04, 2020 showed LVEF 50 to 55% Euvolemic on exam Strict I's and O's and daily weight Net I&O +1.2, unclear if accurate, euvolemic.  Ambulatory dysfunction PT recs Home health PT, patient declined. C/w fall precautions  Code Status: DNR  Family Communication: None at bedside  Disposition Plan: Likely will discharge to home on 11/06/20.   Consultants:  Dietary  Procedures:  None  Antimicrobials:  Rocephin  Azithromycin  DVT prophylaxis: Eliquis.  Status is: Inpatient    Dispo:  Patient From: Home  Planned Disposition: Home  Expected discharge date: 11/06/20  Medically stable for discharge: No, ongoing management of CAP and presumed aspiration pneumonia, currently on IV antibiotics.         Objective: Vitals:   11/05/20 0401 11/05/20 0404 11/05/20 0750 11/05/20 1400  BP: (!) 118/55  (!) 105/51 (!) 105/52  Pulse: 81  82 80  Resp: (!) 22     Temp: 98.7 F (37.1 C)  98 F (36.7 C) 97.8 F (36.6 C)  TempSrc: Oral  Oral Oral  SpO2: 98%  98%   Weight:  44.8 kg    Height:        Intake/Output Summary (Last 24 hours) at 11/05/2020 1505 Last data filed at 11/05/2020 1145 Gross per 24 hour  Intake 1226.47 ml  Output 725 ml  Net 501.47 ml   Filed Weights   11/03/20 0500 11/04/20 0500 11/05/20 0404  Weight: 43.5 kg 45 kg 44.8 kg    Exam:  . General: 79 y.o. year-old male Severely emaciated.  No acute distress.  Alert and oriented x3. . Cardiovascular: Regular rate and rhythm no rubs or gallops.  Respiratory: Mild rales at bases no wheezing noted.  Good inspiratory effort.   . Abdomen: Soft nontender normal bowel  sounds present.  PEG tube in place.   . Musculoskeletal: No lower extremity edema bilaterally. Marland Kitchen Psychiatry: Mood is appropriate for condition and setting.  Data Reviewed: CBC: Recent Labs  Lab 11/02/20 0744 11/03/20 0604 11/04/20 0459 11/05/20 0545  WBC 19.3* 11.0* 7.8 6.1  NEUTROABS 18.0*  --  6.5 4.6  HGB 9.7* 8.8* 8.8* 8.6*  HCT 29.8* 28.0* 27.6* 27.6*  MCV 101.7* 103.7* 102.2* 104.9*  PLT 427* 390 416* 759*   Basic Metabolic Panel: Recent Labs  Lab 11/02/20 0744 11/03/20 0604 11/03/20 1039 11/03/20 1645 11/04/20 0459 11/04/20 0519 11/04/20 1619 11/05/20 0545  NA 137 138  --   --   --  136  --  133*  K 4.8 5.1  --   --   --  4.4  --  4.6  CL 105 108  --   --   --  107  --  105  CO2 24 23  --   --   --  21*  --  22  GLUCOSE 117* 98  --   --   --  123*  --  96  BUN 38* 22  --   --   --  18  --  18  CREATININE 1.31* 0.90  --   --   --  0.86  --  0.92  CALCIUM 10.5* 9.8  --   --   --  9.9  --  9.6  MG  --   --  2.3 2.2 2.2  --  2.2  --   PHOS  --   --  2.5 2.5 2.3*  --  2.7  --    GFR: Estimated Creatinine Clearance: 41.3 mL/min (by C-G formula based on SCr of 0.92 mg/dL). Liver Function Tests: Recent Labs  Lab 11/02/20 0744  AST 31  ALT 33  ALKPHOS 74  BILITOT 0.3  PROT 7.0  ALBUMIN 2.7*   No results for input(s): LIPASE, AMYLASE in the last 168 hours. No results for input(s): AMMONIA in the last 168 hours. Coagulation Profile: Recent Labs  Lab 11/02/20 0744  INR 1.5*   Cardiac Enzymes: No results for input(s): CKTOTAL, CKMB, CKMBINDEX, TROPONINI in the last 168 hours. BNP (last 3 results) No results for input(s): PROBNP in the last 8760 hours. HbA1C: No results for input(s): HGBA1C in the last 72 hours. CBG: Recent Labs  Lab 11/04/20 1957 11/04/20 2354 11/05/20 0358 11/05/20 0805 11/05/20 1151  GLUCAP 111* 108* 98 115* 98   Lipid Profile: No results for input(s): CHOL, HDL, LDLCALC, TRIG, CHOLHDL, LDLDIRECT in the last 72  hours. Thyroid Function Tests: No results for input(s): TSH, T4TOTAL, FREET4, T3FREE, THYROIDAB in the last 72 hours. Anemia Panel: No results for input(s): VITAMINB12, FOLATE, FERRITIN, TIBC, IRON, RETICCTPCT in the last 72 hours. Urine analysis:    Component Value Date/Time   COLORURINE YELLOW 11/02/2020 1249   APPEARANCEUR CLEAR 11/02/2020 1249   LABSPEC 1.017 11/02/2020 1249   PHURINE 7.0 11/02/2020 1249   GLUCOSEU NEGATIVE 11/02/2020 1249   HGBUR NEGATIVE 11/02/2020 1249   BILIRUBINUR NEGATIVE 11/02/2020 1249   KETONESUR NEGATIVE 11/02/2020 1249   PROTEINUR NEGATIVE 11/02/2020 1249   UROBILINOGEN 1.0 05/27/2010 1055   NITRITE POSITIVE (A) 11/02/2020 1249   LEUKOCYTESUR SMALL (A) 11/02/2020 1249   Sepsis Labs: @LABRCNTIP (procalcitonin:4,lacticidven:4)  ) Recent Results (from the past 240 hour(s))  Respiratory Panel by RT PCR (Flu A&B, Covid) - Nasopharyngeal Swab     Status: None   Collection Time: 11/02/20  7:44 AM   Specimen: Nasopharyngeal Swab  Result Value Ref Range Status   SARS Coronavirus 2 by RT PCR NEGATIVE NEGATIVE Final    Comment: (NOTE) SARS-CoV-2 target nucleic acids are NOT DETECTED.  The SARS-CoV-2 RNA is generally detectable in upper respiratoy specimens during the acute phase of infection. The lowest concentration of SARS-CoV-2 viral copies this assay can detect is 131 copies/mL. A negative result does not preclude SARS-Cov-2 infection and should not be used as the sole basis for treatment or other patient management decisions. A negative result may occur with  improper specimen collection/handling, submission of specimen other than nasopharyngeal swab, presence of viral mutation(s) within the areas targeted by this assay, and inadequate number of viral copies (<131 copies/mL). A negative result must be combined with clinical observations, patient history, and epidemiological information. The expected result is Negative.  Fact Sheet for Patients:   PinkCheek.be  Fact Sheet for Healthcare Providers:  GravelBags.it  This test is no t yet approved or cleared by the [place] FDA and  has been authorized for detection and/or diagnosis of SARS-CoV-2 by FDA under an Emergency  Use Authorization (EUA). This EUA will remain  in effect (meaning this test can be used) for the duration of the COVID-19 declaration under Section 564(b)(1) of the Act, 21 U.S.C. section 360bbb-3(b)(1), unless the authorization is terminated or revoked sooner.     Influenza A by PCR NEGATIVE NEGATIVE Final   Influenza B by PCR NEGATIVE NEGATIVE Final    Comment: (NOTE) The Xpert Xpress SARS-CoV-2/FLU/RSV assay is intended as an aid in  the diagnosis of influenza from Nasopharyngeal swab specimens and  should not be used as a sole basis for treatment. Nasal washings and  aspirates are unacceptable for Xpert Xpress SARS-CoV-2/FLU/RSV  testing.  Fact Sheet for Patients: PinkCheek.be  Fact Sheet for Healthcare Providers: GravelBags.it  This test is not yet approved or cleared by the [place] FDA and  has been authorized for detection and/or diagnosis of SARS-CoV-2 by  FDA under an Emergency Use Authorization (EUA). This EUA will remain  in effect (meaning this test can be used) for the duration of the  Covid-19 declaration under Section 564(b)(1) of the Act, 21  U.S.C. section 360bbb-3(b)(1), unless the authorization is  terminated or revoked. Performed at [facility], [place] [address], [place], [place] [address]   Blood Culture (routine x 2)     Status: None (Preliminary result)   Collection Time: 11/02/20  7:44 AM   Specimen: BLOOD  Result Value Ref Range Status   Specimen Description   Final    BLOOD RIGHT ARM Performed at [facility] [address] [place], [place] [address]    Special  Requests   Final    BOTTLES DRAWN AEROBIC AND ANAEROBIC Blood Culture adequate volume Performed at [facility] [address] [place], [place] [address]    Culture   Final    NO GROWTH 3 DAYS Performed at [facility], [place] [address] [place], [place] [address]    Report Status PENDING  Incomplete  Urine culture     Status: None   Collection Time: 11/02/20 12:49 PM   Specimen: In/Out Cath Urine  Result Value Ref Range Status   Specimen Description   Final    IN/OUT CATH URINE Performed at [facility] [address], [place], [place] [address]    Special Requests   Final    Normal Performed at [facility], [place] [address] [place], [place] [address]    Culture   Final    NO GROWTH Performed at [facility], [place] [address] [place], [place] [address]    Report Status 11/03/2020 FINAL  Final  MRSA PCR Screening     Status: None   Collection Time: 11/02/20 10:28 PM   Specimen: Nasopharyngeal  Result Value Ref Range Status   MRSA by PCR NEGATIVE NEGATIVE Final    Comment:        The GeneXpert MRSA Assay (FDA approved for NASAL specimens only), is one component of a comprehensive MRSA colonization surveillance program. It is not intended to diagnose MRSA infection nor to guide or monitor treatment for MRSA infections. Performed at [facility], [place] [address], [place],  [address]   Expectorated sputum assessment w rflx to resp cult     Status: None   Collection Time: 11/03/20  8:55 AM   Specimen: Sputum  Result Value Ref Range Status   Specimen Description SPU  Final   Special Requests SPU  Final   Sputum evaluation  Final    THIS SPECIMEN IS ACCEPTABLE FOR SPUTUM CULTURE Performed at [facility] [address], [place], [place] [address]    Report Status 11/03/2020 FINAL  Final  Culture, respiratory     Status: None (Preliminary result)    Collection Time: 11/03/20  8:55 AM   Specimen: Sputum  Result Value Ref Range Status   Specimen Description   Final    SPU Performed at [facility] [address], [place], [place] [address]    Special Requests   Final    SPU Reflexed from [id number] Performed at [facility], [place] [address], [place], [place] [address]    Gram Stain   Final    RARE WBC PRESENT,BOTH PMN AND MONONUCLEAR RARE GRAM POSITIVE COCCI IN PAIRS RARE GRAM NEGATIVE RODS    Culture   Final    RARE STAPHYLOCOCCUS AUREUS SUSCEPTIBILITIES TO FOLLOW Performed at [facility], [place] [address], [place], [place] [address]    Report Status PENDING  Incomplete      Studies: No results found.  Scheduled Meds: . apixaban  5 mg Per Tube BID  . azithromycin  500 mg Per Tube Daily  . dronedarone  400 mg Per Tube BID WC  . feeding supplement (PROSource TF)  45 mL Per Tube BID  . free water  100 mL Per Tube Q4H  . guaiFENesin  10 mL Per Tube BID  . sodium chloride flush  3 mL Intravenous Q12H  . sodium chloride HYPERTONIC  4 mL Nebulization BID    Continuous Infusions: . sodium chloride Stopped (11/04/20 1420)  . cefTRIAXone (ROCEPHIN)  IV 2 g (11/05/20 0854)  . feeding supplement (OSMOLITE 1.5 CAL) 1,000 mL (11/04/20 1854)     LOS: 3 days     Kayleen Memos, MD [facility] Pager [phone]  If 7PM-7AM, please contact night-coverage [email or web address] Password [email or web address] 11/05/2020, 3:05 PM

## 2020-11-05 NOTE — Care Management Important Message (Signed)
Important Message  Patient Details IM Letter given to the Patient Name: Daniel [name] MRN: [id number] Date of Birth: Apr 29, 1941   Medicare Important Message Given:  Yes     Kerin Salen 11/05/2020, 10:44 AM

## 2020-11-05 NOTE — TOC Initial Note (Signed)
Transition of Care [facility]) - Initial/Assessment Note    Patient Details  Name: Daniel [name] MRN: [id number] Date of Birth: 1941/12/21  Transition of [facility]) CM/SW Contact:    Dessa Phi, RN Phone Number: 11/05/2020, 10:15 AM  Clinical Narrative: From home,indep-patient states he doesn't use a rw. PT recc HHPT-patient declines any HHC-his son provides his PEG feedings, has pcp, pharmacy, own transport home. No further CM needs.                  Expected Discharge Plan: Home/Self Care Barriers to Discharge: Continued Medical Work up   Patient Goals and CMS Choice Patient states their goals for this hospitalization and ongoing recovery are:: go home CMS Medicare.gov Compare Post Acute Care list provided to:: Patient Choice offered to / list presented to : Patient  Expected Discharge Plan and Services Expected Discharge Plan: Home/Self Care   Discharge Planning Services: CM Consult Post Acute Care Choice: Resumption of Svcs/PTA Provider Living arrangements for the past 2 months: Single Family Home                           HH Arranged: Patient Refused HH          Prior Living Arrangements/Services Living arrangements for the past 2 months: Single Family Home Lives with:: Adult Children Patient language and need for interpreter reviewed:: Yes Do you feel safe going back to the place where you live?: Yes      Need for Family Participation in Patient Care: No (Comment) Care giver support system in place?: Yes (comment) Current home services: Other (comment) (Has PEG-formula ordered.) Criminal Activity/Legal Involvement Pertinent to Current Situation/Hospitalization: No - Comment as needed  Activities of Daily Living Home Assistive Devices/Equipment: None ADL Screening (condition at time of admission) Patient's cognitive ability adequate to safely complete daily activities?: Yes Is the patient deaf or have difficulty hearing?: No Does the patient have difficulty  seeing, even when wearing glasses/contacts?: Yes Does the patient have difficulty concentrating, remembering, or making decisions?: No Patient able to express need for assistance with ADLs?: Yes Does the patient have difficulty dressing or bathing?: No Independently performs ADLs?: Yes (appropriate for developmental age) Does the patient have difficulty walking or climbing stairs?: No Weakness of Legs: None Weakness of Arms/Hands: None  Permission Sought/Granted Permission sought to share information with : Case Manager Permission granted to share information with : Yes, Verbal Permission Granted  Share Information with NAME: Case manager     Permission granted to share info w Relationship: Doomes son [phone]     Emotional Assessment Appearance:: Appears stated age Attitude/Demeanor/Rapport: Gracious Affect (typically observed): Accepting Orientation: : Oriented to Self, Oriented to Place, Oriented to  Time, Oriented to Situation Alcohol / Substance Use: Not Applicable Psych Involvement: No (comment)  Admission diagnosis:  CAP (community acquired pneumonia) [J18.9] Urinary tract infection without hematuria, site unspecified [N39.0] Community acquired pneumonia of right lung, unspecified part of lung [J18.9] Sepsis with acute renal failure without septic shock, due to unspecified organism, unspecified acute renal failure type ([place]) [A41.9, R65.20, N17.9] Patient Active Problem List   Diagnosis Date Noted  . Aspiration pneumonia ([place]) 11/02/2020  . Chronic systolic CHF (congestive heart failure) ([place]) 11/02/2020  . Acute lower UTI 11/02/2020  . Sepsis ([place]) 11/02/2020  . AMS (altered mental status) 11/02/2020  . AKI (acute kidney injury) ([place]) 11/02/2020  . Hypercalcemia 11/02/2020  . Fall at home, initial encounter 11/02/2020  .  DNR (do not resuscitate)   . Malignant neoplasm of esophagus ([place]) 06/21/2020  . Dysphagia 04/15/2020  . Protein-calorie malnutrition, severe  ([place]) 09/30/2018  . Vitamin B12 deficiency 09/13/2015  . Healthcare maintenance 03/03/2013  . 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 Benign prostatic hyperplasia 03/03/2012  . Peripheral artery disease ([place]) 08/13/2011  . Lung nodule 11/06/2010  . Atrial fibrillation status post cardioversion ([place]) 06/05/2010  . Hyperlipidemia 06/21/2007  . Essential hypertension 05/25/2007  . Tobacco abuse 12/08/2006  . Osteoarthritis of thoracolumbar spine 12/08/2006   PCP:  [name], MD Pharmacy:   [place], [place] [address] [place] [address] Phone: [phone] Fax: [place], [place] - [place] AT [facility] OF [place] [address] Phone: [phone] Fax: [phone]     Social Determinants of Health (SDOH) Interventions    Readmission Risk Interventions No flowsheet data found.

## 2020-11-06 DIAGNOSIS — J189 Pneumonia, unspecified organism: Secondary | ICD-10-CM

## 2020-11-06 DIAGNOSIS — I4891 Unspecified atrial fibrillation: Secondary | ICD-10-CM

## 2020-11-06 LAB — CBC
HCT: 26.7 % — ABNORMAL LOW (ref 39.0–52.0)
Hemoglobin: 8.4 g/dL — ABNORMAL LOW (ref 13.0–17.0)
MCH: 32.7 pg (ref 26.0–34.0)
MCHC: 31.5 g/dL (ref 30.0–36.0)
MCV: 103.9 fL — ABNORMAL HIGH (ref 80.0–100.0)
Platelets: 428 10*3/uL — ABNORMAL HIGH (ref 150–400)
RBC: 2.57 MIL/uL — ABNORMAL LOW (ref 4.22–5.81)
RDW: 12.3 % (ref 11.5–15.5)
WBC: 6.3 10*3/uL (ref 4.0–10.5)
nRBC: 0 % (ref 0.0–0.2)

## 2020-11-06 LAB — CULTURE, RESPIRATORY W GRAM STAIN

## 2020-11-06 LAB — GLUCOSE, CAPILLARY
Glucose-Capillary: 96 mg/dL (ref 70–99)
Glucose-Capillary: 106 mg/dL — ABNORMAL HIGH (ref 70–99)

## 2020-11-06 LAB — PROCALCITONIN: Procalcitonin: 1.09 ng/mL

## 2020-11-06 NOTE — Progress Notes (Signed)
NUTRITION NOTE  Patient seen for full assessment on 11/15 by this RD. He is currently receiving Osmolite 1.5 @ 50 ml/hr with 45 ml Prosource TF BID and 100 ml free water every 4 hours.   This regimen is providing 1880 kcal, 97 grams protein, and 1514 ml free water.  Discharge order in but no discharge summary yet.   Recommend transition back to bolus TF regimen for home. MD note from yesterday outlines importance of head of bed being >35 degrees for patient at time of receiving TF.  Home TF Regimen Recommendation: 1 carton (237 ml) Osmolite 1.5 x5/day with 60 ml free water before and 60 ml after each TF bolus and 45 ml Prosource TF (or equivalent) BID.    This regimen will provide 1855 kcal, 96 grams protein, and 1505 ml free water.   * make sure that patient is sitting up or in hospital bed with head of bed >35 degrees when receiving TF and for at least 1 hour after each TF bolus.  * infuse each bolus of TF over 15 minutes.    Estimated Nutritional Needs:  Kcal:  1710-1900 kcal Protein:  85-100 grams Fluid:  >/= 2 L/day      Jarome Matin, MS, RD, LDN, CNSC Inpatient Clinical Dietitian RD pager # available in [facility]  After hours/weekend pager # available in [facility]

## 2020-11-06 NOTE — Discharge Summary (Signed)
Physician Discharge Summary  Daniel [name] YNW:[id number] DOB: [birth year] DOA: 11/02/2020  PCP: [name], MD  Admit date: 11/02/2020 Discharge date: 11/06/2020  Admitted From: Home Disposition:  Home  Recommendations for Outpatient Follow-up:  1. Follow up with PCP in 1-2 weeks  Home Health:Pt declined [facility] services   Discharge Condition:Improved CODE STATUS:DNR Diet recommendation: Tube feeding  Per dietitian, " Osmolite 1.5 @ 50 ml/hr with 45 ml Prosource TF BID and 100 ml free water every 4 hours.  - this regimen will provide 1880 kcal, 97 grams protein, and 1514 ml free water."  Brief/Interim Summary: 79 year old male with a past medical history of esophageal cancerwith Peg tube feeding dependence, hypertension, hyperlipidemia, A. fib s/p cardioversionon Multaq and Eliquis,NICM,HFpEF 50 to 55%(07/04/20),PAD who was brought in by EMS from home with complaints of tiredness,confusion with dark/cloudy urine for several daysas well as a fall at home prior to arrival. The patient has been very weak for several days with more confusion than usual. Typically ambulates on his own but yesterday could only sit on the bed.  On the day of presentation, his son called EMS because he found the patient on the floor. He held the patient back to the bed prior to EMS arrival. Currently, the patient is able to recall these events somewhat and states that he was in bed reaching for his "spittoon"and he fell out of bed.  Reports a productive cough x1 month, denies any urinary complaints. He was given 500 cc NS via EMS bolus prior to arrival. Vitals per EMS: T 101.9 F, SPO2 83% on room air requiring NRB, BP 102/58, HR 107.  Discharge Diagnoses:  Principal Problem:   Sepsis ([place]) Active Problems:   Essential hypertension   Atrial fibrillation status post cardioversion [facility])   Malignant neoplasm of esophagus (HCC)   Aspiration pneumonia (HCC)   Chronic systolic CHF (congestive  heart failure) (HCC)   Acute lower UTI   AMS (altered mental status)   AKI (acute kidney injury) ([place])   Hypercalcemia   Fall at home, initial encounter   Sepsis, improving, secondary to aspiration pneumonia, CAP, poa  Presented with fever 102.5, tachycardic, tachypneic, WBC 19,000, chest x-ray consistent with right-sided pneumonia with suspected aspiration. UA was positive for pyuria however urine culture was negative. He was started on Rocephin and azithromycin, continue Sputum culture with RARE STAPHYLOCOCCUS AUREUS that was found to be pan-senstive MRSA screening negative 11/02/20. WBC normalized Procalcitonin 2 from 4 from greater than 8 Repeat procalcitonin in the AM and follow cultures Pt has completed course of azithromycin and rocephin On minimal O2 support Plan to DC home today.  He declined HHS.  Presumed aspiration pneumonia in the setting of dysphagia on chronic PEG tube feedings. Self-reported he gets 5-6 feedings per day.  Assisted by his son at home.  Currently does not have home health services.  States he was doing so well previously that he no longer required home health services. Was continued with pulmonary toilet.  Dysphagia in the setting of esophageal cancer He is completely n.p.o. Completely relies on PEG tube feeding for nutrition Dietary consulted to assist with tube feedings and nutritional goals Maintain head of bed above 35 degrees during feedings Continue Aspiration precautions  Severe protein calorie malnutrition BMI 13 Severe muscle mass loss Dietary consulted and following. Continue appetite stimulant from home via peg tube.  Paroxysmal A. fib on Eliquis Currently in sinus rhythm Continued with Eliquis Continue to monitor on telemetry.  Chronic macrocytic anemia Presented with  hemoglobin 9.7 and MCV of 101 Hemoglobin plateaud 8.8 from 8.8 No overt bleeding this visit  Resolved Mild hyperkalemia Serum potassium 5.1>> 4.4>>  4.6 Resolved  Chronic diastolic CHF Last 2D echo done on July 04, 2020 showed LVEF 50 to 55% Euvolemic on exam  Ambulatory dysfunction PT recs Home health PT, patient declined.  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   Eliquis 5 MG Tabs tablet Generic drug: apixaban PLACE ONE TABLET IN FEEDING TUBE TWICE DAILY What changed: See the new instructions.   metoprolol tartrate 25 mg/10 mL Susp Commonly known as: LOPRESSOR Place 10 mLs (25 mg total) into feeding tube 2 (two) times daily.   Multaq 400 MG tablet Generic drug: dronedarone PLACE 1 TABLET IN FEEDING TUBE TWO TIMES DAILY WITH A MEAL What changed: See the new instructions.   oxyCODONE-acetaminophen 10-325 MG tablet Commonly known as: Percocet Take 1 tablet by mouth every 6 (six) hours as needed for pain.   polyethylene glycol 17 g packet Commonly known as: MIRALAX / GLYCOLAX Place 17 g into feeding tube daily as needed for mild constipation.       Follow-up Information    [name], MD. Schedule an appointment as soon as possible for a visit in 1 week(s).   Specialty: Internal Medicine Contact information: [address] [place] [address] [phone]              Allergies  Allergen Reactions  . 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 Flomax [Tamsulosin Hcl]     Dizzy     Procedures/Studies: DG Chest Port 1 View  Result Date: 11/02/2020 CLINICAL DATA:  Fatigue and confusion. History of esophageal cancer. EXAM: PORTABLE CHEST 1 VIEW COMPARISON:  07/03/2020 FINDINGS: Lungs are hyperexpanded. Interstitial markings are diffusely coarsened with chronic features. Focal airspace disease noted right mid lung  with patchy airspace opacity in the right lower lung. No substantial pleural effusion The cardiopericardial silhouette is within normal limits for size. Bones are diffusely demineralized. Telemetry leads overlie the chest. IMPRESSION: 1. Right mid and lower lung airspace disease, compatible with pneumonia. Metastatic disease not excluded. 2. Emphysema with chronic interstitial coarsening. Electronically Signed   By: Misty Stanley M.D.   On: 11/02/2020 08:22    Subjective: Very eager to go home  Discharge Exam: Vitals:   11/05/20 2032 11/06/20 0559  BP: (!) 111/57 (!) 114/57  Pulse: 78 80  Resp: 20 18  Temp: 97.8 F (36.6 C) 98.1 F (36.7 C)  SpO2: 100% 100%   Vitals:   11/05/20 1400 11/05/20 1952 11/05/20 2032 11/06/20 0559  BP: (!) 105/52  (!) 111/57 (!) 114/57  Pulse: 80  78 80  Resp:   20 18  Temp: 97.8 F (36.6 C)  97.8 F (36.6 C) 98.1 F (36.7 C)  TempSrc: Oral  Oral Oral  SpO2:  98% 100% 100%  Weight:    44.2 kg  Height:        General: Pt is alert, awake, not in acute distress Cardiovascular: RRR, S1/S2 +, no rubs, no gallops Respiratory: CTA bilaterally, no wheezing, no rhonchi Abdominal: Soft, NT, ND, bowel sounds +  Extremities: no edema, no cyanosis   The results of significant diagnostics from this hospitalization (including imaging, microbiology, ancillary and laboratory) are listed below for reference.     Microbiology: Recent Results (from the past 240 hour(s))  Respiratory Panel by RT PCR (Flu A&B, Covid) - Nasopharyngeal Swab     Status: None   Collection Time: 11/02/20  7:44 AM   Specimen: Nasopharyngeal Swab  Result Value Ref Range Status   SARS Coronavirus 2 by RT PCR NEGATIVE NEGATIVE Final    Comment: (NOTE) SARS-CoV-2 target nucleic acids are NOT DETECTED.  The SARS-CoV-2 RNA is generally detectable in upper respiratoy specimens during the acute phase of infection. The lowest concentration of SARS-CoV-2 viral copies this assay can detect  is 131 copies/mL. A negative result does not preclude SARS-Cov-2 infection and should not be used as the sole basis for treatment or other patient management decisions. A negative result may occur with  improper specimen collection/handling, submission of specimen other than nasopharyngeal swab, presence of viral mutation(s) within the areas targeted by this assay, and inadequate number of viral copies (<131 copies/mL). A negative result must be combined with clinical observations, patient history, and epidemiological information. The expected result is Negative.  Fact Sheet for Patients:  PinkCheek.be  Fact Sheet for Healthcare Providers:  GravelBags.it  This test is no t yet approved or cleared by the [place] FDA and  has been authorized for detection and/or diagnosis of SARS-CoV-2 by FDA under an Emergency Use Authorization (EUA). This EUA will remain  in effect (meaning this test can be used) for the duration of the COVID-19 declaration under Section 564(b)(1) of the Act, 21 U.S.C. section 360bbb-3(b)(1), unless the authorization is terminated or revoked sooner.     Influenza A by PCR NEGATIVE NEGATIVE Final   Influenza B by PCR NEGATIVE NEGATIVE Final    Comment: (NOTE) The Xpert Xpress SARS-CoV-2/FLU/RSV assay is intended as an aid in  the diagnosis of influenza from Nasopharyngeal swab specimens and  should not be used as a sole basis for treatment. Nasal washings and  aspirates are unacceptable for Xpert Xpress SARS-CoV-2/FLU/RSV  testing.  Fact Sheet for Patients: PinkCheek.be  Fact Sheet for Healthcare Providers: GravelBags.it  This test is not yet approved or cleared by the [place] FDA and  has been authorized for detection and/or diagnosis of SARS-CoV-2 by  FDA under an Emergency Use Authorization (EUA). This EUA will remain  in effect  (meaning this test can be used) for the duration of the  Covid-19 declaration under Section 564(b)(1) of the Act, 21  U.S.C. section 360bbb-3(b)(1), unless the authorization is  terminated or revoked. Performed at [facility], [place] [address], [place], [place] [address]   Blood Culture (routine x 2)     Status: None (Preliminary result)   Collection Time: 11/02/20  7:44 AM   Specimen: BLOOD  Result Value Ref Range Status   Specimen Description   Final    BLOOD RIGHT ARM Performed at [place] [address], [place], [place] [address]    Special Requests   Final    BOTTLES DRAWN AEROBIC AND ANAEROBIC Blood Culture adequate volume Performed at [facility] [address], [place], [place] [address]    Culture   Final    NO GROWTH 4 DAYS Performed at [facility], [place] [address], [place], [place] [address]    Report Status PENDING  Incomplete  Urine culture  Status: None   Collection Time: 11/02/20 12:49 PM   Specimen: In/Out Cath Urine  Result Value Ref Range Status   Specimen Description   Final    IN/OUT CATH URINE Performed at [facility], [place] [address] [place], [place] [address]    Special Requests   Final    Normal Performed at [facility], [place] [address] [place], [place] [address]    Culture   Final    NO GROWTH Performed at [facility], [place] [address], [place], [place] [address]    Report Status 11/03/2020 FINAL  Final  MRSA PCR Screening     Status: None   Collection Time: 11/02/20 10:28 PM   Specimen: Nasopharyngeal  Result Value Ref Range Status   MRSA by PCR NEGATIVE NEGATIVE Final    Comment:        The GeneXpert MRSA Assay (FDA approved for NASAL specimens only), is one component of a comprehensive MRSA colonization surveillance program. It is not intended to diagnose MRSA infection nor to guide or monitor treatment for MRSA  infections. Performed at [facility], [place] [address], [place], [place] [address]   Expectorated sputum assessment w rflx to resp cult     Status: None   Collection Time: 11/03/20  8:55 AM   Specimen: Sputum  Result Value Ref Range Status   Specimen Description SPU  Final   Special Requests SPU  Final   Sputum evaluation   Final    THIS SPECIMEN IS ACCEPTABLE FOR SPUTUM CULTURE Performed at [facility], [place] [address], [place], [place] [address]    Report Status 11/03/2020 FINAL  Final  Culture, respiratory     Status: None   Collection Time: 11/03/20  8:55 AM   Specimen: Sputum  Result Value Ref Range Status   Specimen Description   Final    SPU Performed at [place] [facility] [address] [place], [place] [address]    Special Requests   Final    SPU Reflexed from [id number] Performed at [facility], [place] [address], [place], [place] [address]    Gram Stain   Final    RARE WBC PRESENT,BOTH PMN AND MONONUCLEAR RARE GRAM POSITIVE COCCI IN PAIRS RARE GRAM NEGATIVE RODS Performed at [facility], [place] [address] [place], [place] [address]    Culture RARE STAPHYLOCOCCUS AUREUS  Final   Report Status 11/06/2020 FINAL  Final   Organism ID, Bacteria STAPHYLOCOCCUS AUREUS  Final      Susceptibility   Staphylococcus aureus - MIC*    CIPROFLOXACIN <=0.5 SENSITIVE Sensitive     ERYTHROMYCIN <=0.25 SENSITIVE Sensitive     GENTAMICIN <=0.5 SENSITIVE Sensitive     OXACILLIN 0.5 SENSITIVE Sensitive     TETRACYCLINE <=1 SENSITIVE Sensitive     VANCOMYCIN 1 SENSITIVE Sensitive     TRIMETH/SULFA <=10 SENSITIVE Sensitive     CLINDAMYCIN <=0.25 SENSITIVE Sensitive     RIFAMPIN <=0.5 SENSITIVE Sensitive     Inducible Clindamycin NEGATIVE Sensitive     * RARE STAPHYLOCOCCUS AUREUS     Labs: BNP (last 3 results) No results for input(s): BNP in the last 8760 hours. Basic Metabolic Panel: Recent Labs  Lab  11/02/20 0744 11/03/20 0604 11/03/20 1039 11/03/20 1645 11/04/20 0459 11/04/20 0519 11/04/20 1619 11/05/20 0545  NA 137 138  --   --   --  136  --  133*  K 4.8 5.1  --   --   --  4.4  --  4.6  CL 105 108  --   --   --  107  --  105  CO2 24 23  --   --   --  21*  --  22  GLUCOSE 117* 98  --   --   --  123*  --  96  BUN 38* 22  --   --   --  18  --  18  CREATININE 1.31* 0.90  --   --   --  0.86  --  0.92  CALCIUM 10.5* 9.8  --   --   --  9.9  --  9.6  MG  --   --  2.3 2.2 2.2  --  2.2  --   PHOS  --   --  2.5 2.5 2.3*  --  2.7  --    Liver Function Tests: Recent Labs  Lab 11/02/20 0744  AST 31  ALT 33  ALKPHOS 74  BILITOT 0.3  PROT 7.0  ALBUMIN 2.7*   No results for input(s): LIPASE, AMYLASE in the last 168 hours. No results for input(s): AMMONIA in the last 168 hours. CBC: Recent Labs  Lab 11/02/20 0744 11/03/20 0604 11/04/20 0459 11/05/20 0545 11/06/20 0536  WBC 19.3* 11.0* 7.8 6.1 6.3  NEUTROABS 18.0*  --  6.5 4.6  --   HGB 9.7* 8.8* 8.8* 8.6* 8.4*  HCT 29.8* 28.0* 27.6* 27.6* 26.7*  MCV 101.7* 103.7* 102.2* 104.9* 103.9*  PLT 427* 390 416* 424* 428*   Cardiac Enzymes: No results for input(s): CKTOTAL, CKMB, CKMBINDEX, TROPONINI in the last 168 hours. BNP: Invalid input(s): POCBNP CBG: Recent Labs  Lab 11/05/20 0805 11/05/20 1151 11/05/20 1652 11/06/20 0743 11/06/20 1140  GLUCAP 115* 98 78 96 106*   D-Dimer No results for input(s): DDIMER in the last 72 hours. Hgb A1c No results for input(s): HGBA1C in the last 72 hours. Lipid Profile No results for input(s): CHOL, HDL, LDLCALC, TRIG, CHOLHDL, LDLDIRECT in the last 72 hours. Thyroid function studies No results for input(s): TSH, T4TOTAL, T3FREE, THYROIDAB in the last 72 hours.  Invalid input(s): FREET3 Anemia work up No results for input(s): VITAMINB12, FOLATE, FERRITIN, TIBC, IRON, RETICCTPCT in the last 72 hours. Urinalysis    Component Value Date/Time   COLORURINE YELLOW 11/02/2020 1249    APPEARANCEUR CLEAR 11/02/2020 1249   LABSPEC 1.017 11/02/2020 1249   PHURINE 7.0 11/02/2020 1249   GLUCOSEU NEGATIVE 11/02/2020 1249   HGBUR NEGATIVE 11/02/2020 1249   BILIRUBINUR NEGATIVE 11/02/2020 1249   KETONESUR NEGATIVE 11/02/2020 1249   PROTEINUR NEGATIVE 11/02/2020 1249   UROBILINOGEN 1.0 05/27/2010 1055   NITRITE POSITIVE (A) 11/02/2020 1249   LEUKOCYTESUR SMALL (A) 11/02/2020 1249   Sepsis Labs Invalid input(s): PROCALCITONIN,  WBC,  LACTICIDVEN Microbiology Recent Results (from the past 240 hour(s))  Respiratory Panel by RT PCR (Flu A&B, Covid) - Nasopharyngeal Swab     Status: None   Collection Time: 11/02/20  7:44 AM   Specimen: Nasopharyngeal Swab  Result Value Ref Range Status   SARS Coronavirus 2 by RT PCR NEGATIVE NEGATIVE Final    Comment: (NOTE) SARS-CoV-2 target nucleic acids are NOT DETECTED.  The SARS-CoV-2 RNA is generally detectable in upper respiratoy specimens during the acute phase of infection. The lowest concentration of SARS-CoV-2 viral copies this assay can detect is 131 copies/mL. A negative result does not preclude SARS-Cov-2 infection and should not be used as the sole basis for treatment or other patient management  decisions. A negative result may occur with  improper specimen collection/handling, submission of specimen other than nasopharyngeal swab, presence of viral mutation(s) within the areas targeted by this assay, and inadequate number of viral copies (<131 copies/mL). A negative result must be combined with clinical observations, patient history, and epidemiological information. The expected result is Negative.  Fact Sheet for Patients:  PinkCheek.be  Fact Sheet for Healthcare Providers:  GravelBags.it  This test is no t yet approved or cleared by the [place] FDA and  has been authorized for detection and/or diagnosis of SARS-CoV-2 by FDA under an Emergency Use  Authorization (EUA). This EUA will remain  in effect (meaning this test can be used) for the duration of the COVID-19 declaration under Section 564(b)(1) of the Act, 21 U.S.C. section 360bbb-3(b)(1), unless the authorization is terminated or revoked sooner.     Influenza A by PCR NEGATIVE NEGATIVE Final   Influenza B by PCR NEGATIVE NEGATIVE Final    Comment: (NOTE) The Xpert Xpress SARS-CoV-2/FLU/RSV assay is intended as an aid in  the diagnosis of influenza from Nasopharyngeal swab specimens and  should not be used as a sole basis for treatment. Nasal washings and  aspirates are unacceptable for Xpert Xpress SARS-CoV-2/FLU/RSV  testing.  Fact Sheet for Patients: PinkCheek.be  Fact Sheet for Healthcare Providers: GravelBags.it  This test is not yet approved or cleared by the [place] FDA and  has been authorized for detection and/or diagnosis of SARS-CoV-2 by  FDA under an Emergency Use Authorization (EUA). This EUA will remain  in effect (meaning this test can be used) for the duration of the  Covid-19 declaration under Section 564(b)(1) of the Act, 21  U.S.C. section 360bbb-3(b)(1), unless the authorization is  terminated or revoked. Performed at [facility], [place] [address] [place], [place] [address]   Blood Culture (routine x 2)     Status: None (Preliminary result)   Collection Time: 11/02/20  7:44 AM   Specimen: BLOOD  Result Value Ref Range Status   Specimen Description   Final    BLOOD RIGHT ARM Performed at [facility] [address] [place], [place] [address]    Special Requests   Final    BOTTLES DRAWN AEROBIC AND ANAEROBIC Blood Culture adequate volume Performed at [facility] [address], [place], [place] [address]    Culture   Final    NO GROWTH 4 DAYS Performed at [facility], [place] [address] [place], [place] [address]     Report Status PENDING  Incomplete  Urine culture     Status: None   Collection Time: 11/02/20 12:49 PM   Specimen: In/Out Cath Urine  Result Value Ref Range Status   Specimen Description   Final    IN/OUT CATH URINE Performed at [facility] [address] [place], [place] [address]    Special Requests   Final    Normal Performed at [facility], [place] [address], [place], [place] [address]    Culture   Final    NO GROWTH Performed at [facility], [place] [address], [place], [place] [address]    Report Status 11/03/2020 FINAL  Final  MRSA PCR Screening     Status: None   Collection Time: 11/02/20 10:28 PM   Specimen: Nasopharyngeal  Result Value Ref Range Status   MRSA by PCR NEGATIVE NEGATIVE Final    Comment:  The GeneXpert MRSA Assay (FDA approved for NASAL specimens only), is one component of a comprehensive MRSA colonization surveillance program. It is not intended to diagnose MRSA infection nor to guide or monitor treatment for MRSA infections. Performed at [facility], [place] [address], [place], [place] [address]   Expectorated sputum assessment w rflx to resp cult     Status: None   Collection Time: 11/03/20  8:55 AM   Specimen: Sputum  Result Value Ref Range Status   Specimen Description SPU  Final   Special Requests SPU  Final   Sputum evaluation   Final    THIS SPECIMEN IS ACCEPTABLE FOR SPUTUM CULTURE Performed at [facility], [place] [address], [place], [place] [address]    Report Status 11/03/2020 FINAL  Final  Culture, respiratory     Status: None   Collection Time: 11/03/20  8:55 AM   Specimen: Sputum  Result Value Ref Range Status   Specimen Description   Final    SPU Performed at [place] [address], [place], [place] [address]    Special Requests   Final    SPU Reflexed from [id number] Performed at [facility], [place] [address], [place], [place] [address]    Gram Stain   Final    RARE WBC PRESENT,BOTH PMN AND MONONUCLEAR RARE GRAM POSITIVE COCCI IN PAIRS RARE GRAM NEGATIVE RODS Performed at [facility], [place] [address] [place],  [address]    Culture RARE STAPHYLOCOCCUS AUREUS  Final   Report Status 11/06/2020 FINAL  Final   Organism ID, Bacteria STAPHYLOCOCCUS AUREUS  Final      Susceptibility   Staphylococcus aureus - MIC*    CIPROFLOXACIN <=0.5 SENSITIVE Sensitive     ERYTHROMYCIN <=0.25 SENSITIVE Sensitive     GENTAMICIN <=0.5 SENSITIVE Sensitive     OXACILLIN 0.5 SENSITIVE Sensitive     TETRACYCLINE <=1 SENSITIVE Sensitive     VANCOMYCIN 1 SENSITIVE Sensitive     TRIMETH/SULFA <=10 SENSITIVE Sensitive     CLINDAMYCIN <=0.25 SENSITIVE Sensitive     RIFAMPIN <=0.5 SENSITIVE Sensitive     Inducible Clindamycin NEGATIVE Sensitive     * RARE STAPHYLOCOCCUS AUREUS   Time spent: 30 min  SIGNED:   Marylu Lund, MD  [facility] 11/06/2020, 4:49 PM  If 7PM-7AM, please contact night-coverage

## 2020-11-07 ENCOUNTER — Other Ambulatory Visit: Payer: Self-pay

## 2020-11-07 LAB — CULTURE, BLOOD (ROUTINE X 2)
Culture: NO GROWTH
Special Requests: ADEQUATE

## 2020-11-07 NOTE — Telephone Encounter (Signed)
oxyCODONE-acetaminophen (PERCOCET) 10-325 MG tablet, REFILL REQUEST @  [place], [address] Phone:  [phone]  Fax:  [phone]

## 2020-11-08 MED ORDER — OXYCODONE-ACETAMINOPHEN 10-325 MG PO TABS: 1.0000 | ORAL_TABLET | Freq: Four times a day (QID) | ORAL | 0 refills | Status: DC | PRN

## 2020-11-08 NOTE — Telephone Encounter (Addendum)
Attempted to notify patient that refill has been sent. No answer and no VMB set up. Hubbard Hartshorn, BSN, RN-BC

## 2020-11-08 NOTE — Telephone Encounter (Signed)
Patient called in stating he is completely out of his pain med. He would be very appreciative if this could be filled today. Thank you. Hubbard Hartshorn, BSN, RN-BC

## 2020-11-29 ENCOUNTER — Encounter: Payer: Self-pay | Admitting: *Deleted

## 2020-11-29 NOTE — Progress Notes (Signed)

## 2020-12-02 NOTE — Progress Notes (Signed)
Things That May Be Affecting Your Health:  Alcohol  Hearing loss  Pain   x Depression x Home Safety  Sexual Health   Diabetes  Lack of physical activity  Stress   Difficulty with daily activities  Loneliness  Tiredness   Drug use  Medicines  Tobacco use  x Falls  Motor Vehicle Safety  Weight   Food choices  Oral Health  Other    YOUR PERSONALIZED HEALTH PLAN : 1. Schedule your next subsequent Medicare Wellness visit in one year 2. Attend all of your regular appointments to address your medical issues 3. Complete the preventative screenings and services   Annual Wellness Visit   Medicare Covered Preventative Screenings and [place] Men and Women Who How Often Need? Date of Last Service Action  Abdominal Aortic Aneurysm Adults with AAA risk factors Once     Alcohol Misuse and Counseling All Adults Screening once a year if no alcohol misuse. Counseling up to 4 face to face sessions.     Bone Density Measurement  Adults at risk for osteoporosis Once every 2 yrs     Lipid Panel Z13.6 All adults without CV disease Once every 5 yrs     Colorectal Cancer   Stool sample or  Colonoscopy All adults 46 and older   Once every year  Every 10 years     Depression All Adults Once a year  Today   Diabetes Screening Blood glucose, post glucose load, or GTT Z13.1  All adults at risk  Pre-diabetics  Once per year  Twice per year     Diabetes  Self-Management Training All adults Diabetics 10 hrs first year; 2 hours subsequent years. Requires Copay     Glaucoma  Diabetics  Family history of glaucoma  African Americans 59 yrs +  Hispanic Americans 82 yrs + Annually - requires coppay     Hepatitis C Z72.89 or F19.20  High Risk for HCV  Born between 1945 and 1965  Annually  Once     HIV Z11.4 All adults based on risk  Annually btw ages 75 & 60 regardless of risk  Annually > 65 yrs if at increased risk     Lung Cancer Screening Asymptomatic adults aged  75-77 with 30 pack yr history and current smoker OR quit within the last 15 yrs Annually Must have counseling and shared decision making documentation before first screen     Medical Nutrition Therapy Adults with   Diabetes  Renal disease  Kidney transplant within past 3 yrs 3 hours first year; 2 hours subsequent years     Obesity and Counseling All adults Screening once a year Counseling if BMI 30 or higher  Today   Tobacco Use Counseling Adults who use tobacco  Up to 8 visits in one year     Vaccines Z23  Hepatitis B  Influenza   Pneumonia  Adults   Once  Once every flu season  Two different vaccines separated by one year     Next Annual Wellness Visit People with Medicare Every year  Today     Services & Screenings Women Who How Often Need  Date of Last Service Action  Mammogram  Z12.31 Women over 37 One baseline ages 63-39. Annually ager 40 yrs+     Pap tests All women Annually if high risk. Every 2 yrs for normal risk women     Screening for cervical cancer with   Pap (Z01.419 nl or Z01.411abnl) &  HPV Z11.51 Women aged 79 to 76 Once every 5 yrs     Screening pelvic and breast exams All women Annually if high risk. Every 2 yrs for normal risk women     Sexually Transmitted Diseases  Chlamydia  Gonorrhea  Syphilis All at risk adults Annually for non pregnant females at increased risk         Bone [place] Men Who How Ofter Need  Date of Last Service Action  Prostate Cancer - DRE & PSA Men over 50 Annually.  DRE might require a copay.     Sexually Transmitted Diseases  Syphilis All at risk adults Annually for men at increased risk      No cancer screening due to limited life expectancy

## 2020-12-04 ENCOUNTER — Other Ambulatory Visit: Payer: Self-pay | Admitting: Student in an Organized Health Care Education/Training Program

## 2020-12-09 ENCOUNTER — Other Ambulatory Visit: Payer: Self-pay

## 2020-12-09 MED ORDER — OXYCODONE-ACETAMINOPHEN 10-325 MG PO TABS: 1.0000 | ORAL_TABLET | Freq: Four times a day (QID) | ORAL | 0 refills | Status: DC | PRN

## 2020-12-09 NOTE — Telephone Encounter (Signed)
Need refills on oxyCODONE-acetaminophen (PERCOCET) 10-325 MG tablet ;pt contact [place], [place]

## 2020-12-23 ENCOUNTER — Telehealth: Payer: Self-pay

## 2020-12-23 ENCOUNTER — Other Ambulatory Visit: Payer: Self-pay | Admitting: *Deleted

## 2020-12-23 MED ORDER — METOPROLOL TARTRATE 25 MG/10 ML ORAL SUSPENSION: 25.0000 mg | Freq: Two times a day (BID) | ORAL | 1 refills | Status: AC

## 2020-12-23 MED ORDER — METOPROLOL TARTRATE 25 MG/10 ML ORAL SUSPENSION: 25.0000 mg | Freq: Two times a day (BID) | ORAL | 1 refills | Status: DC

## 2020-12-23 NOTE — Telephone Encounter (Signed)
Rx called to Myesha at [facility]. Kinnie Feil, BSN, RN-BC

## 2020-12-23 NOTE — Telephone Encounter (Signed)
I have had this problem before on this medicine. I think because it is an unusual liquid formulation it does not e-prescribe. In the past, we have faxed or called in this refill to get around.

## 2020-12-23 NOTE — Addendum Note (Signed)
Addended by: Fredderick Severance on: 12/23/2020 01:29 PM   Modules accepted: Orders

## 2020-12-23 NOTE — Telephone Encounter (Signed)
Print option selected on Rx written today. Message sent to PCP to resend as Normal. L. Leondro Coryell, BSN, RN-BC

## 2020-12-23 NOTE — Telephone Encounter (Signed)
  metoprolol tartrate (LOPRESSOR) 25 mg/10 mL SUSP, refill request @  [id number] [place] Otto, [place] - [address] AT [facility] OF [address] Phone:  [phone]  Fax:  [phone]     Requesting for 90 days supply.

## 2021-01-08 ENCOUNTER — Other Ambulatory Visit: Payer: Self-pay | Admitting: Student in an Organized Health Care Education/Training Program

## 2021-01-08 MED ORDER — OXYCODONE-ACETAMINOPHEN 10-325 MG PO TABS: 1.0000 | ORAL_TABLET | Freq: Four times a day (QID) | ORAL | 0 refills | Status: DC | PRN

## 2021-01-08 NOTE — Telephone Encounter (Signed)
REFILL REQUEST  oxyCODONE-acetaminophen (PERCOCET) 10-325 MG tablet  [facility] [place], [place] - [address] Phone:  [phone]  Fax:  [phone]

## 2021-01-08 NOTE — Telephone Encounter (Signed)
Last rx written 12/09/20. Last OV 09/02/20. Next OV  Has not been scheduled. UDS 09/04/16.

## 2021-01-15 ENCOUNTER — Telehealth: Payer: Self-pay | Admitting: Student in an Organized Health Care Education/Training Program

## 2021-01-15 DIAGNOSIS — C159 Malignant neoplasm of esophagus, unspecified: Secondary | ICD-10-CM

## 2021-01-15 NOTE — Telephone Encounter (Signed)
Patient's son came in today and dropped off form for PCS for Dr. Evette Doffing to complete.  Also requested that Dr. Evette Doffing to please give him a call in reference to this father.  Call back number is [phone].

## 2021-01-15 NOTE — Telephone Encounter (Signed)
The patient's Referral has been Authorized and sent to [facility].  Spoke with Ebony Hail @ [facility] who will make sure the patient is contacted as soon as possible.

## 2021-01-15 NOTE — Telephone Encounter (Signed)
I spoke with Daniel [name]. Patient has esophageal squamous cell carcinoma, not interested in oncology treatments, has been at home since November, taking all feeds by G tube, only accepts pain medications from me. Very stoic man, but sounds like he is having increasing pain, discomfort at home, and his son needs to go back to work. I will be happy to fill out PCS forms, but I advised that it sounds like he is advancing through the dying process. I recommended referral to hospice services, I explained to him those services and responsibility of family support. He was aggreeable and thought having those services would be very helpful. He wants to keep the patient at home for as long as possible, but it sounds like he is approaching need for inpatient hospice services.

## 2021-01-15 NOTE — Telephone Encounter (Signed)
Thank you! [name] is the patient's son and the best contact person.

## 2021-01-20 ENCOUNTER — Telehealth: Payer: Self-pay

## 2021-01-20 NOTE — Telephone Encounter (Signed)
TC to [facility], was notified of message below. SChaplin, RN,BSN

## 2021-01-20 NOTE — Telephone Encounter (Signed)
Yes I agree to both those questions.

## 2021-01-20 NOTE — Telephone Encounter (Signed)
Received a call from [facility] with [place] asking if Dr. Evette Doffing will agree that patient is terminally ill and agree to act as pcp for patient while he is on hospice. Will forward to MD. Laurence Compton, RN,BSN

## 2021-01-22 ENCOUNTER — Telehealth: Payer: Self-pay | Admitting: *Deleted

## 2021-01-22 NOTE — Telephone Encounter (Signed)
Patient may receive Hospice services and PCS simultaneously as long as the 2 Aides are not in the home at the same time. PCS requires a visit within the last 90 days. Patient's last OV was 09/02/2020 which is outside this window. However, discussion between son and PCP on 01/15/2021 may suffice as tele visit. Have put this date on PCS form and placed in PCP's box for completion. Hubbard Hartshorn, BSN, RN-BC

## 2021-01-22 NOTE — Telephone Encounter (Signed)
Call from [place] with [facility] - states pt cancelled his hospice appt today. He offered to re-schedule; they told Adam they have the phone#.

## 2021-01-29 NOTE — Telephone Encounter (Signed)
Completed and signed Request for Independent Assessment for [place] of Medical Need faxed to [place] at [phone]. Fax confirmation receipt received. Hubbard Hartshorn, BSN, RN-BC

## 2021-02-07 ENCOUNTER — Other Ambulatory Visit: Payer: Self-pay

## 2021-02-07 NOTE — Telephone Encounter (Signed)
Need refill on oxyCODONE-acetaminophen (PERCOCET) 10-325 MG tablet ;pt contact [place], [place]

## 2021-02-10 MED ORDER — OXYCODONE-ACETAMINOPHEN 10-325 MG PO TABS: 1.0000 | ORAL_TABLET | Freq: Four times a day (QID) | ORAL | 0 refills | Status: DC | PRN

## 2021-03-05 ENCOUNTER — Other Ambulatory Visit: Payer: Self-pay | Admitting: Student in an Organized Health Care Education/Training Program

## 2021-03-07 ENCOUNTER — Other Ambulatory Visit: Payer: Self-pay

## 2021-03-07 NOTE — Telephone Encounter (Signed)
Last office visit: 09/02/2020 Last Refill: last written 02/10/21  #120 Next appt: none scheduled

## 2021-03-07 NOTE — Telephone Encounter (Signed)
  oxyCODONE-acetaminophen (PERCOCET) 10-325 MG tablet, refill request @  [place], [address] Phone:  [phone]  Fax:  [phone]     Pt would like increase on the pain med, please call back.

## 2021-03-10 MED ORDER — OXYCODONE-ACETAMINOPHEN 10-325 MG PO TABS: 1.0000 | ORAL_TABLET | Freq: Four times a day (QID) | ORAL | 0 refills | Status: DC | PRN

## 2021-04-10 ENCOUNTER — Other Ambulatory Visit: Payer: Self-pay

## 2021-04-10 MED ORDER — OXYCODONE-ACETAMINOPHEN 10-325 MG PO TABS: 1.0000 | ORAL_TABLET | Freq: Four times a day (QID) | ORAL | 0 refills | Status: DC | PRN

## 2021-04-10 NOTE — Telephone Encounter (Signed)
Last rx written  03/10/21. Last OV 09/02/20. Next OV ha not been scheduled. UDS has not been done.

## 2021-04-10 NOTE — Telephone Encounter (Signed)
Pt is requesting his oxyCODONE-acetaminophen (PERCOCET) 10-325 MG tablet sent to  [facility], [address] Phone:  [phone]  Fax:  [phone]      ( pt stated that his medicine is due on the 23  So he is calling it in to be able to get it on time )

## 2021-04-13 ENCOUNTER — Other Ambulatory Visit: Payer: Self-pay | Admitting: Student in an Organized Health Care Education/Training Program

## 2021-05-08 ENCOUNTER — Other Ambulatory Visit: Payer: Self-pay | Admitting: Student in an Organized Health Care Education/Training Program

## 2021-05-08 MED ORDER — OXYCODONE-ACETAMINOPHEN 10-325 MG PO TABS: 1.0000 | ORAL_TABLET | Freq: Four times a day (QID) | ORAL | 0 refills | Status: DC | PRN

## 2021-05-08 NOTE — Telephone Encounter (Signed)
RTC to patient's son, Jacqulynn Cadet [facility]), who states [facility] is having trouble compounding his metoprolol because they are missing an ingredient. TC to [facility], spoke with pharmacist who states they just received the last ingredient needed to compound the medication and will have it ready for pick up in 1 hour. TC back to patient's son and he was notified.  Pt's son is also requesting pain medication, states the Percocet is not getting rid of all of his pain and he is having to take the medication round the clock.  Asking if there is something else. Forwarding to PCP. SChaplin, RN,BSN

## 2021-05-08 NOTE — Telephone Encounter (Signed)
Refill Request per son [name] the current pharmacy does not have the component needed to make the following medication, and he was asked to call his PCP.  Also please verify which Pharmacy the patient is needing to the the current medication filled it.   metoprolol tartrate (LOPRESSOR) 25 mg/10 mL SUSP

## 2021-05-12 ENCOUNTER — Telehealth: Payer: Self-pay

## 2021-05-12 DIAGNOSIS — C159 Malignant neoplasm of esophagus, unspecified: Secondary | ICD-10-CM

## 2021-05-12 NOTE — Telephone Encounter (Signed)
Return call to pt's son. Stated he would like to talk to Dr Evette Doffing. He stated pt is in pain and coughing up blood. I asked if the pt is taking Oxycodone, he stated he's out; informed oxycodone was refilled on 5/19 - stated no one had called him. He will call the pharmacy but he still wants to talk to Dr Evette Doffing.

## 2021-05-12 NOTE — Telephone Encounter (Signed)
I spoke with Daniel [name]. Patient has gastric cancer, he is G tube dependent for all feeds and medications. He had some hematemesis yesterday. Overall declining functional status, he spends most of the day in bed, can do some ADLs with assistance. His pain is not adequately controlled on oxycodone 10mg  four times daily. He wants to stay home, does not want inpatient facility.   We decided to initiate home hospice, I will place the referral. I think he could be a good candidate for SL morphine, but I would want the hospice provider to teach Daniel [name] how to properly use it. I advised stopping eliquis given hematemesis. Ok to give 5 tabs of oxycodone a day until hospice services can be initiated. I will be happy to serve as attending for hospice services.

## 2021-05-12 NOTE — Telephone Encounter (Signed)
Requesting to speak with Dr. Evette Doffing. Please call back.

## 2021-05-13 NOTE — Telephone Encounter (Signed)
Spoke with [place]. She will put this in the workq for [facility] today.

## 2021-05-16 DIAGNOSIS — J449 Chronic obstructive pulmonary disease, unspecified: Secondary | ICD-10-CM | POA: Diagnosis not present

## 2021-05-16 DIAGNOSIS — R1310 Dysphagia, unspecified: Secondary | ICD-10-CM | POA: Diagnosis not present

## 2021-05-16 DIAGNOSIS — D630 Anemia in neoplastic disease: Secondary | ICD-10-CM | POA: Diagnosis not present

## 2021-05-16 DIAGNOSIS — R159 Full incontinence of feces: Secondary | ICD-10-CM | POA: Diagnosis not present

## 2021-05-16 DIAGNOSIS — I4891 Unspecified atrial fibrillation: Secondary | ICD-10-CM | POA: Diagnosis not present

## 2021-05-16 DIAGNOSIS — E46 Unspecified protein-calorie malnutrition: Secondary | ICD-10-CM | POA: Diagnosis not present

## 2021-05-16 DIAGNOSIS — R32 Unspecified urinary incontinence: Secondary | ICD-10-CM | POA: Diagnosis not present

## 2021-05-16 DIAGNOSIS — I10 Essential (primary) hypertension: Secondary | ICD-10-CM | POA: Diagnosis not present

## 2021-05-16 DIAGNOSIS — Z931 Gastrostomy status: Secondary | ICD-10-CM | POA: Diagnosis not present

## 2021-05-16 DIAGNOSIS — E785 Hyperlipidemia, unspecified: Secondary | ICD-10-CM | POA: Diagnosis not present

## 2021-05-16 DIAGNOSIS — C159 Malignant neoplasm of esophagus, unspecified: Secondary | ICD-10-CM | POA: Diagnosis not present

## 2021-05-19 ENCOUNTER — Other Ambulatory Visit: Payer: Self-pay | Admitting: Student in an Organized Health Care Education/Training Program

## 2021-05-20 NOTE — Telephone Encounter (Signed)
Pt's son requesting to speak with a nurse about meds. Please call back.Marland Kitchen

## 2021-05-21 ENCOUNTER — Telehealth: Payer: Self-pay

## 2021-05-21 DIAGNOSIS — C159 Malignant neoplasm of esophagus, unspecified: Secondary | ICD-10-CM | POA: Diagnosis not present

## 2021-05-21 DIAGNOSIS — I10 Essential (primary) hypertension: Secondary | ICD-10-CM | POA: Diagnosis not present

## 2021-05-21 DIAGNOSIS — Z931 Gastrostomy status: Secondary | ICD-10-CM | POA: Diagnosis not present

## 2021-05-21 DIAGNOSIS — R159 Full incontinence of feces: Secondary | ICD-10-CM | POA: Diagnosis not present

## 2021-05-21 DIAGNOSIS — R32 Unspecified urinary incontinence: Secondary | ICD-10-CM | POA: Diagnosis not present

## 2021-05-21 DIAGNOSIS — E46 Unspecified protein-calorie malnutrition: Secondary | ICD-10-CM | POA: Diagnosis not present

## 2021-05-21 DIAGNOSIS — I4891 Unspecified atrial fibrillation: Secondary | ICD-10-CM | POA: Diagnosis not present

## 2021-05-21 DIAGNOSIS — J449 Chronic obstructive pulmonary disease, unspecified: Secondary | ICD-10-CM | POA: Diagnosis not present

## 2021-05-21 DIAGNOSIS — E785 Hyperlipidemia, unspecified: Secondary | ICD-10-CM | POA: Diagnosis not present

## 2021-05-21 DIAGNOSIS — D630 Anemia in neoplastic disease: Secondary | ICD-10-CM | POA: Diagnosis not present

## 2021-05-21 DIAGNOSIS — R1310 Dysphagia, unspecified: Secondary | ICD-10-CM | POA: Diagnosis not present

## 2021-05-21 MED ORDER — MULTAQ 400 MG PO TABS: ORAL_TABLET | ORAL | 1 refills | Status: AC

## 2021-05-21 NOTE — Telephone Encounter (Signed)
Pt's son is calling back regarding medicine; pt is completely out of meds [phone]

## 2021-05-21 NOTE — Telephone Encounter (Signed)
Filled today by PCP. SChaplin, RN,BSN

## 2021-05-21 NOTE — Telephone Encounter (Signed)
Pt  Son is requesting MULTAQ 400 MG tablet  Sent to  [facility], [place] Phone:  [phone]  Fax:  [phone]

## 2021-05-22 ENCOUNTER — Other Ambulatory Visit: Payer: Self-pay | Admitting: Student in an Organized Health Care Education/Training Program

## 2021-05-22 NOTE — Telephone Encounter (Signed)
Need refill on Eliquis  ;pt contact [place], [place]

## 2021-05-22 NOTE — Telephone Encounter (Signed)
I spoke with Daniel [name]. We clarified the issue with the apixaban, I still dont recommend continued use based on recent bleeding. Daniel [name] tells me that they have discontinued the home hospice services. They were not satisfied with the timing of the services and the patient did not want to engage in their care. I advised that we can continue to prescribe his pain medication and will be available to help as able.

## 2021-05-22 NOTE — Telephone Encounter (Signed)
RTC, [name] is requesting a refill on the Eliquis.  Per PCP's note on 05/12/21, home hospice was initiated and PCP advised stopping Eliquis given hematemesis.  RN gently reminded [name] of this conversation and that sometimes risks of taking a drug outweighs the benefits.  He is very appreciative, but also wants to discuss this further with PCP and asking for him to call him back. Will forward to Dr. Evette Doffing. SChaplin, RN,BSN

## 2021-06-02 ENCOUNTER — Other Ambulatory Visit: Payer: Self-pay

## 2021-06-02 ENCOUNTER — Telehealth: Payer: Self-pay | Admitting: Student in an Organized Health Care Education/Training Program

## 2021-06-02 MED ORDER — OXYCODONE-ACETAMINOPHEN 10-325 MG PO TABS: 1.0000 | ORAL_TABLET | Freq: Four times a day (QID) | ORAL | 0 refills | Status: DC | PRN

## 2021-06-02 NOTE — Telephone Encounter (Signed)
  oxyCODONE-acetaminophen (PERCOCET) 10-325 MG tablet, REFILL REQUEST @  [facility], [place] Phone:  [phone]

## 2021-06-02 NOTE — Telephone Encounter (Signed)
Last rx written 05/08/21. Last OV 09/02/20. Next OV has not been scheduled. UDS 09/04/16.

## 2021-06-02 NOTE — Telephone Encounter (Signed)
Pt's following medication was called and and can not be picked up per the patient's son.  He states the pharmacy told him it was too early and will need an  approval .    oxyCODONE-acetaminophen (PERCOCET) 10-325 MG tablet   [place], [place] (Ph: [phone]

## 2021-06-03 ENCOUNTER — Telehealth: Payer: Self-pay | Admitting: *Deleted

## 2021-06-03 MED ORDER — OXYCODONE-ACETAMINOPHEN 10-325 MG PO TABS: 1.0000 | ORAL_TABLET | ORAL | 0 refills | Status: AC | PRN

## 2021-06-03 MED ORDER — OXYCODONE-ACETAMINOPHEN 10-325 MG PO TABS: 2.0000 | ORAL_TABLET | Freq: Four times a day (QID) | ORAL | 0 refills | Status: DC | PRN

## 2021-06-03 NOTE — Telephone Encounter (Signed)
RTC to Mr. [name], he was informed that patient's insurance is requiring a PA, per pharmacy d/t insurance only wanting to pay for 6 tabs / day, and that [facility] is working on that now.  Informed him that after nurse fills out PA request, it will be up to patient's insurance to approve.  He was also notified that a new RX for a total of 6 tabs / day (q every 4 hours) was just submitted to pharmacy via [place].  Mr [name] was encouraged to check with pharmacy this afternoon to see if Insurance approved medication.  He was also reminded he could pay out of pocket if needed while waiting on PA.  This RN did place another TC to [facility], but could not speak to pharmacist, each time RN was placed on hold. SChaplin, RN,BSN

## 2021-06-03 NOTE — Telephone Encounter (Signed)
Ok. I have sent a new rx. Patient can take 1 or 2 tabs of oxycodone every 6 hours through G tube as needed for pain.

## 2021-06-03 NOTE — Telephone Encounter (Signed)
Ok. We will try a new script for 1 tab q 4 hours (6 per day). I hope insurance will approve this.

## 2021-06-03 NOTE — Telephone Encounter (Signed)
Spoke with Pharmacist Rye who states insurance paid for the last RX of Oxycodone (1 tablet Q 4 hours).  He will place the previous Oxycodone RX on hold which Regino Schultze is obtaining PA on.   Pharmacist is contacting [facility], Mr Gorden Harms to inform him RX is ready for pick up. SChaplin, RN,BSN

## 2021-06-03 NOTE — Telephone Encounter (Addendum)
Information was sent via CoverMyMeds for PA for Oxycodone Acetaminophen 10/325 mg # 140 tablets.   Awaiting detrmination.  Sander Nephew, RN 06/03/2021 4:38 PM.     PA for Oxycodone Acetaminophen has been approved -Quantity- Limits  from 12/21/2020 thru 12/20/2021 .  Patient may have a quantity of 140 tablets every 18 days.  Safety edits may apply to the approval.  Sander Nephew, RN 06/04/2021 10:56 AM.

## 2021-06-03 NOTE — Addendum Note (Signed)
Addended by: Lalla Brothers T on: 06/03/2021 03:19 PM   Modules accepted: Orders

## 2021-06-03 NOTE — Telephone Encounter (Signed)
Received a TC from patient's son, Mr. [name] who states he is still unable to pick up the pain medication.  TC to [facility], spoke to Pharmacist, Rye, who states they received the new RX, but it needs a PA which was just sent to [facility] today.  Pharmacist states a PA is needed because insurance will only pay for 6 tabs/ day and the new RX is written for 8 tabs/ day. RN explained to pharmacist that patient is at end of life and has not had any pain medication since yesterday.  Pharmacist states if RX is rewritten for 1 tab Q 4 hours, insurance may pay for it without needing a PA, but he can't guarantee this.    Will forward to PCP to advise, as well as, PA team.   Thank you, SChaplin, RN,BSN

## 2021-06-03 NOTE — Telephone Encounter (Signed)
Talked to the pharmacist at [facility] about filling Percocet rx early; pharmacy made aware of end-of life situation. Stated pt received 30 day supply on 5/22 which will be 8 days early. He stated the doctor needs to send in another rx with dose change or inform them how early the doctor wants the pharmacy to refill. Thanks

## 2021-06-12 ENCOUNTER — Telehealth: Payer: Self-pay | Admitting: Student in an Organized Health Care Education/Training Program

## 2021-06-12 ENCOUNTER — Other Ambulatory Visit: Payer: Self-pay

## 2021-06-12 ENCOUNTER — Inpatient Hospital Stay (HOSPITAL_COMMUNITY)
Admission: EM | Admit: 2021-06-12 | Discharge: 2021-06-16 | DRG: 640 | Disposition: A | Discharge status: Hospice | Payer: Medicare Other | Attending: Family Medicine | Admitting: Family Medicine

## 2021-06-12 DIAGNOSIS — I482 Chronic atrial fibrillation, unspecified: Secondary | ICD-10-CM | POA: Diagnosis not present

## 2021-06-12 DIAGNOSIS — R627 Adult failure to thrive: Secondary | ICD-10-CM | POA: Diagnosis present

## 2021-06-12 DIAGNOSIS — R197 Diarrhea, unspecified: Secondary | ICD-10-CM | POA: Diagnosis present

## 2021-06-12 DIAGNOSIS — Z79899 Other long term (current) drug therapy: Secondary | ICD-10-CM

## 2021-06-12 DIAGNOSIS — Z681 Body mass index (BMI) 19 or less, adult: Secondary | ICD-10-CM

## 2021-06-12 DIAGNOSIS — Z66 Do not resuscitate: Secondary | ICD-10-CM | POA: Diagnosis not present

## 2021-06-12 DIAGNOSIS — E861 Hypovolemia: Secondary | ICD-10-CM | POA: Diagnosis present

## 2021-06-12 DIAGNOSIS — E871 Hypo-osmolality and hyponatremia: Secondary | ICD-10-CM | POA: Diagnosis not present

## 2021-06-12 DIAGNOSIS — Z8249 Family history of ischemic heart disease and other diseases of the circulatory system: Secondary | ICD-10-CM

## 2021-06-12 DIAGNOSIS — E860 Dehydration: Secondary | ICD-10-CM | POA: Diagnosis present

## 2021-06-12 DIAGNOSIS — I428 Other cardiomyopathies: Secondary | ICD-10-CM | POA: Diagnosis not present

## 2021-06-12 DIAGNOSIS — Z8501 Personal history of malignant neoplasm of esophagus: Secondary | ICD-10-CM

## 2021-06-12 DIAGNOSIS — Z931 Gastrostomy status: Secondary | ICD-10-CM

## 2021-06-12 DIAGNOSIS — I5022 Chronic systolic (congestive) heart failure: Secondary | ICD-10-CM | POA: Diagnosis present

## 2021-06-12 DIAGNOSIS — Z20822 Contact with and (suspected) exposure to covid-19: Secondary | ICD-10-CM | POA: Diagnosis present

## 2021-06-12 DIAGNOSIS — R531 Weakness: Secondary | ICD-10-CM | POA: Diagnosis not present

## 2021-06-12 DIAGNOSIS — I739 Peripheral vascular disease, unspecified: Secondary | ICD-10-CM | POA: Diagnosis present

## 2021-06-12 DIAGNOSIS — R1310 Dysphagia, unspecified: Secondary | ICD-10-CM | POA: Diagnosis present

## 2021-06-12 DIAGNOSIS — K5641 Fecal impaction: Secondary | ICD-10-CM | POA: Diagnosis not present

## 2021-06-12 DIAGNOSIS — I110 Hypertensive heart disease with heart failure: Secondary | ICD-10-CM | POA: Diagnosis present

## 2021-06-12 DIAGNOSIS — Z888 Allergy status to other drugs, medicaments and biological substances status: Secondary | ICD-10-CM

## 2021-06-12 DIAGNOSIS — E43 Unspecified severe protein-calorie malnutrition: Secondary | ICD-10-CM | POA: Diagnosis present

## 2021-06-12 DIAGNOSIS — E538 Deficiency of other specified B group vitamins: Secondary | ICD-10-CM | POA: Diagnosis present

## 2021-06-12 DIAGNOSIS — F17210 Nicotine dependence, cigarettes, uncomplicated: Secondary | ICD-10-CM | POA: Diagnosis present

## 2021-06-12 DIAGNOSIS — D649 Anemia, unspecified: Secondary | ICD-10-CM | POA: Diagnosis present

## 2021-06-12 LAB — COMPREHENSIVE METABOLIC PANEL
ALT: 12 U/L (ref 0–44)
AST: 31 U/L (ref 15–41)
Albumin: 3.2 g/dL — ABNORMAL LOW (ref 3.5–5.0)
Alkaline Phosphatase: 79 U/L (ref 38–126)
Anion gap: 6 (ref 5–15)
BUN: 20 mg/dL (ref 8–23)
CO2: 27 mmol/L (ref 22–32)
Calcium: 10.6 mg/dL — ABNORMAL HIGH (ref 8.9–10.3)
Chloride: 91 mmol/L — ABNORMAL LOW (ref 98–111)
Creatinine, Ser: 0.89 mg/dL (ref 0.61–1.24)
GFR, Estimated: >60 mL/min (ref >60)
Glucose, Bld: 96 mg/dL (ref 70–99)
Potassium: 5 mmol/L (ref 3.5–5.1)
Sodium: 124 mmol/L — ABNORMAL LOW (ref 135–145)
Total Bilirubin: 0.4 mg/dL (ref 0.3–1.2)
Total Protein: 6.4 g/dL — ABNORMAL LOW (ref 6.5–8.1)

## 2021-06-12 LAB — BRAIN NATRIURETIC PEPTIDE: B Natriuretic Peptide: 32.3 pg/mL (ref 0.0–100.0)

## 2021-06-12 NOTE — ED Triage Notes (Deleted)
Pt came from home via EMS. C/c: weakness, visual hallucinations (possible UTI related confusion), edema from not taking lasix last few days. Pt denies urinary symptoms currently.  2+ pitting edema in lower legs bilaterally

## 2021-06-12 NOTE — ED Triage Notes (Signed)
Per EMS, patient from home, c/o painful and difficult urination for a few days. Hx throat cancer. Not getting treatments at this time.

## 2021-06-12 NOTE — Telephone Encounter (Signed)
Pt's son calling to report the pt (Daniel [name]) is now having uncontrollable Loose Bowels.  He is also reporting the pt has an open wound in the center of the top part of the buttocks he is concerned with.  Please call the back.

## 2021-06-12 NOTE — Telephone Encounter (Signed)
TC to [name] and he was informed that Dr. Evette Doffing did send a message back to RN and was in agreement that patient should be evaluated in ED for his symptoms.  He was very Patent attorney. SChaplin, RN,BSN

## 2021-06-12 NOTE — ED Notes (Signed)
Labs collection documented under incorrect nurse tech. Labs collected by Probation officer. Shirlee Limerick, NT made aware.

## 2021-06-12 NOTE — Telephone Encounter (Signed)
RTC to patient's son, [name].  He states patient has a sore to his sacrum and it is just red now, but hurts him.  States he was given some medicated dressings from the hospital to place on the sore, which he is doing.  RN told him to continue this, try to keep the area/dressing dry and clean from fecal matter.   He also states patient was constipated last week so he gave him 2 stool softeners, he started having diarrhea X3 days ago and it won't stop.  He wants to know if there is something he can give him for diarrhea.  He called EMS last night, but states they told him "it looks like a UTI to them".  RN asked if they said why, he states because patient was confused. [name] reports that pt is still urinating, does have confusion, states EMS report V/S WNL.  RN gently discussed that confusion can also happen at end of life and asked Mr Daniel [name] if he thought about calling hospice back in, he states "no that he doesn't want that".    RN advised with pt having continuous diarrhea, he could be dehydrated and may feel better with IV fluids and ED could r/o UTI, etc.  Advised to take him to ED by EMS.  Also informed him she will send a message to PCP to advise as well. SChaplin, RN,BSN

## 2021-06-12 NOTE — Telephone Encounter (Signed)
Thank you Daniel [name]. It sounds like the patient's end-of-life symptom burden is too high right now for them to manage at home. I agree with asking him to come to the ED for evaluation.
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
## 2021-06-13 ENCOUNTER — Encounter (HOSPITAL_COMMUNITY): Payer: Self-pay | Admitting: Internal Medicine

## 2021-06-13 DIAGNOSIS — Z888 Allergy status to other drugs, medicaments and biological substances status: Secondary | ICD-10-CM | POA: Diagnosis not present

## 2021-06-13 DIAGNOSIS — Z7189 Other specified counseling: Secondary | ICD-10-CM | POA: Diagnosis not present

## 2021-06-13 DIAGNOSIS — I739 Peripheral vascular disease, unspecified: Secondary | ICD-10-CM | POA: Diagnosis present

## 2021-06-13 DIAGNOSIS — I482 Chronic atrial fibrillation, unspecified: Secondary | ICD-10-CM | POA: Diagnosis present

## 2021-06-13 DIAGNOSIS — R1084 Generalized abdominal pain: Secondary | ICD-10-CM | POA: Diagnosis not present

## 2021-06-13 DIAGNOSIS — Z8501 Personal history of malignant neoplasm of esophagus: Secondary | ICD-10-CM | POA: Diagnosis not present

## 2021-06-13 DIAGNOSIS — K5641 Fecal impaction: Secondary | ICD-10-CM | POA: Diagnosis present

## 2021-06-13 DIAGNOSIS — Z515 Encounter for palliative care: Secondary | ICD-10-CM | POA: Diagnosis not present

## 2021-06-13 DIAGNOSIS — E871 Hypo-osmolality and hyponatremia: Secondary | ICD-10-CM | POA: Diagnosis present

## 2021-06-13 DIAGNOSIS — E538 Deficiency of other specified B group vitamins: Secondary | ICD-10-CM | POA: Diagnosis present

## 2021-06-13 DIAGNOSIS — I5022 Chronic systolic (congestive) heart failure: Secondary | ICD-10-CM | POA: Diagnosis present

## 2021-06-13 DIAGNOSIS — E861 Hypovolemia: Secondary | ICD-10-CM | POA: Diagnosis present

## 2021-06-13 DIAGNOSIS — I428 Other cardiomyopathies: Secondary | ICD-10-CM | POA: Diagnosis present

## 2021-06-13 DIAGNOSIS — I110 Hypertensive heart disease with heart failure: Secondary | ICD-10-CM | POA: Diagnosis present

## 2021-06-13 DIAGNOSIS — R531 Weakness: Secondary | ICD-10-CM | POA: Diagnosis not present

## 2021-06-13 DIAGNOSIS — Z7401 Bed confinement status: Secondary | ICD-10-CM | POA: Diagnosis not present

## 2021-06-13 DIAGNOSIS — R1310 Dysphagia, unspecified: Secondary | ICD-10-CM | POA: Diagnosis present

## 2021-06-13 DIAGNOSIS — D649 Anemia, unspecified: Secondary | ICD-10-CM | POA: Diagnosis present

## 2021-06-13 DIAGNOSIS — Z66 Do not resuscitate: Secondary | ICD-10-CM | POA: Diagnosis present

## 2021-06-13 DIAGNOSIS — Z20822 Contact with and (suspected) exposure to covid-19: Secondary | ICD-10-CM | POA: Diagnosis present

## 2021-06-13 DIAGNOSIS — F17210 Nicotine dependence, cigarettes, uncomplicated: Secondary | ICD-10-CM | POA: Diagnosis present

## 2021-06-13 DIAGNOSIS — R197 Diarrhea, unspecified: Secondary | ICD-10-CM | POA: Diagnosis present

## 2021-06-13 DIAGNOSIS — Z931 Gastrostomy status: Secondary | ICD-10-CM | POA: Diagnosis not present

## 2021-06-13 DIAGNOSIS — Z79899 Other long term (current) drug therapy: Secondary | ICD-10-CM | POA: Diagnosis not present

## 2021-06-13 DIAGNOSIS — Z8249 Family history of ischemic heart disease and other diseases of the circulatory system: Secondary | ICD-10-CM | POA: Diagnosis not present

## 2021-06-13 DIAGNOSIS — E43 Unspecified severe protein-calorie malnutrition: Secondary | ICD-10-CM | POA: Diagnosis present

## 2021-06-13 DIAGNOSIS — Z681 Body mass index (BMI) 19 or less, adult: Secondary | ICD-10-CM | POA: Diagnosis not present

## 2021-06-13 DIAGNOSIS — R627 Adult failure to thrive: Secondary | ICD-10-CM | POA: Diagnosis present

## 2021-06-13 LAB — BASIC METABOLIC PANEL
Anion gap: 10 (ref 5–15)
Anion gap: 8 (ref 5–15)
Anion gap: 6 (ref 5–15)
BUN: 15 mg/dL (ref 8–23)
BUN: 15 mg/dL (ref 8–23)
BUN: 17 mg/dL (ref 8–23)
CO2: 26 mmol/L (ref 22–32)
CO2: 24 mmol/L (ref 22–32)
CO2: 25 mmol/L (ref 22–32)
Calcium: 11.1 mg/dL — ABNORMAL HIGH (ref 8.9–10.3)
Calcium: 10.5 mg/dL — ABNORMAL HIGH (ref 8.9–10.3)
Calcium: 10.2 mg/dL (ref 8.9–10.3)
Chloride: 93 mmol/L — ABNORMAL LOW (ref 98–111)
Chloride: 95 mmol/L — ABNORMAL LOW (ref 98–111)
Chloride: 97 mmol/L — ABNORMAL LOW (ref 98–111)
Creatinine, Ser: 0.9 mg/dL (ref 0.61–1.24)
Creatinine, Ser: 0.74 mg/dL (ref 0.61–1.24)
Creatinine, Ser: 0.76 mg/dL (ref 0.61–1.24)
GFR, Estimated: >60 mL/min (ref >60)
GFR, Estimated: >60 mL/min (ref >60)
GFR, Estimated: >60 mL/min (ref >60)
Glucose, Bld: 109 mg/dL — ABNORMAL HIGH (ref 70–99)
Glucose, Bld: 101 mg/dL — ABNORMAL HIGH (ref 70–99)
Glucose, Bld: 118 mg/dL — ABNORMAL HIGH (ref 70–99)
Potassium: 4.5 mmol/L (ref 3.5–5.1)
Potassium: 4.4 mmol/L (ref 3.5–5.1)
Potassium: 4 mmol/L (ref 3.5–5.1)
Sodium: 129 mmol/L — ABNORMAL LOW (ref 135–145)
Sodium: 127 mmol/L — ABNORMAL LOW (ref 135–145)
Sodium: 128 mmol/L — ABNORMAL LOW (ref 135–145)

## 2021-06-13 LAB — URINALYSIS, ROUTINE W REFLEX MICROSCOPIC
Bilirubin Urine: NEGATIVE
Glucose, UA: NEGATIVE mg/dL
Hgb urine dipstick: NEGATIVE
Ketones, ur: NEGATIVE mg/dL
Leukocytes,Ua: NEGATIVE
Nitrite: NEGATIVE
Protein, ur: NEGATIVE mg/dL
Specific Gravity, Urine: 1.009 (ref 1.005–1.030)
pH: 6 (ref 5.0–8.0)

## 2021-06-13 LAB — RESP PANEL BY RT-PCR (FLU A&B, COVID) ARPGX2
Influenza A by PCR: NEGATIVE
Influenza B by PCR: NEGATIVE
SARS Coronavirus 2 by RT PCR: NEGATIVE

## 2021-06-13 LAB — CBC WITH DIFFERENTIAL/PLATELET
Abs Immature Granulocytes: 0.11 10*3/uL — ABNORMAL HIGH (ref 0.00–0.07)
Basophils Absolute: 0 10*3/uL (ref 0.0–0.1)
Basophils Relative: 0 %
Eosinophils Absolute: 0 10*3/uL (ref 0.0–0.5)
Eosinophils Relative: 0 %
HCT: 28.3 % — ABNORMAL LOW (ref 39.0–52.0)
Hemoglobin: 9.5 g/dL — ABNORMAL LOW (ref 13.0–17.0)
Immature Granulocytes: 1 %
Lymphocytes Relative: 8 %
Lymphs Abs: 0.7 10*3/uL (ref 0.7–4.0)
MCH: 33 pg (ref 26.0–34.0)
MCHC: 33.6 g/dL (ref 30.0–36.0)
MCV: 98.3 fL (ref 80.0–100.0)
Monocytes Absolute: 0.7 10*3/uL (ref 0.1–1.0)
Monocytes Relative: 8 %
Neutro Abs: 7.3 10*3/uL (ref 1.7–7.7)
Neutrophils Relative %: 83 %
Platelets: 384 10*3/uL (ref 150–400)
RBC: 2.88 MIL/uL — ABNORMAL LOW (ref 4.22–5.81)
RDW: 12.3 % (ref 11.5–15.5)
WBC: 8.9 10*3/uL (ref 4.0–10.5)
nRBC: 0 % (ref 0.0–0.2)

## 2021-06-13 LAB — PHOSPHORUS: Phosphorus: 2.2 mg/dL — ABNORMAL LOW (ref 2.5–4.6)

## 2021-06-13 LAB — SODIUM, URINE, RANDOM: Sodium, Ur: 77 mmol/L

## 2021-06-13 LAB — IRON AND TIBC
Iron: 23 ug/dL — ABNORMAL LOW (ref 45–182)
Saturation Ratios: 8 % — ABNORMAL LOW (ref 17.9–39.5)
TIBC: 292 ug/dL (ref 250–450)
UIBC: 269 ug/dL

## 2021-06-13 LAB — OSMOLALITY, URINE: Osmolality, Ur: 363 mOsm/kg (ref 300–900)

## 2021-06-13 LAB — MAGNESIUM: Magnesium: 2.2 mg/dL (ref 1.7–2.4)

## 2021-06-13 MED ORDER — THIAMINE HCL 100 MG PO TABS
100.0000 mg | ORAL_TABLET | Freq: Every day | ORAL | Status: DC
  Administered 2021-06-14 – 2021-06-16 (×3): 100 mg
  Filled 2021-06-13 (×3): qty 1

## 2021-06-13 MED ORDER — SODIUM CHLORIDE 0.9 % IV SOLN: INTRAVENOUS | Status: DC

## 2021-06-13 MED ORDER — OXYCODONE-ACETAMINOPHEN 10-325 MG PO TABS: 1.0000 | ORAL_TABLET | ORAL | Status: DC | PRN

## 2021-06-13 MED ORDER — MORPHINE SULFATE (PF) 2 MG/ML IV SOLN
2.0000 mg | INTRAVENOUS | Status: DC | PRN
  Administered 2021-06-14 – 2021-06-16 (×2): 2 mg via INTRAVENOUS
  Filled 2021-06-13 (×2): qty 1

## 2021-06-13 MED ORDER — DRONEDARONE HCL 400 MG PO TABS
400.0000 mg | ORAL_TABLET | Freq: Two times a day (BID) | ORAL | Status: DC
  Administered 2021-06-13 – 2021-06-16 (×7): 400 mg
  Filled 2021-06-13 (×8): qty 1

## 2021-06-13 MED ORDER — OSMOLITE 1.5 CAL PO LIQD
237.0000 mL | Freq: Every day | ORAL | Status: DC
  Administered 2021-06-13 – 2021-06-16 (×15): 237 mL
  Filled 2021-06-13 (×18): qty 237

## 2021-06-13 MED ORDER — SODIUM CHLORIDE 0.9 % IV BOLUS
500.0000 mL | Freq: Once | INTRAVENOUS | Status: AC
  Administered 2021-06-13: 500 mL via INTRAVENOUS

## 2021-06-13 MED ORDER — ONDANSETRON HCL 4 MG PO TABS: 4.0000 mg | ORAL_TABLET | Freq: Four times a day (QID) | ORAL | Status: DC | PRN

## 2021-06-13 MED ORDER — OXYCODONE HCL 5 MG PO TABS
5.0000 mg | ORAL_TABLET | ORAL | Status: DC | PRN
  Administered 2021-06-13 – 2021-06-16 (×7): 5 mg
  Filled 2021-06-13 (×7): qty 1

## 2021-06-13 MED ORDER — NUTRISOURCE FIBER PO PACK
1.0000 | PACK | Freq: Two times a day (BID) | ORAL | Status: DC
  Administered 2021-06-13 – 2021-06-16 (×6): 1
  Filled 2021-06-13 (×7): qty 1

## 2021-06-13 MED ORDER — PROSOURCE TF PO LIQD
45.0000 mL | Freq: Every day | ORAL | Status: DC
  Administered 2021-06-13 – 2021-06-16 (×4): 45 mL
  Filled 2021-06-13 (×4): qty 45

## 2021-06-13 MED ORDER — OXYCODONE-ACETAMINOPHEN 5-325 MG PO TABS
1.0000 | ORAL_TABLET | ORAL | Status: DC | PRN
  Administered 2021-06-13 – 2021-06-16 (×3): 1
  Filled 2021-06-13 (×3): qty 1

## 2021-06-13 MED ORDER — ONDANSETRON HCL 4 MG/2ML IJ SOLN: 4.0000 mg | Freq: Four times a day (QID) | INTRAMUSCULAR | Status: DC | PRN

## 2021-06-13 MED ORDER — METOPROLOL TARTRATE 25 MG/10 ML ORAL SUSPENSION
25.0000 mg | Freq: Two times a day (BID) | ORAL | Status: DC
  Administered 2021-06-13 – 2021-06-16 (×7): 25 mg
  Filled 2021-06-13 (×8): qty 10
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
## 2021-06-13 NOTE — ED Notes (Signed)
Turned patient on left side due to discomfort from bottom.

## 2021-06-13 NOTE — ED Notes (Signed)
Pt ambulater per MD order. Pt required x2 assist for ambulation. MD notified.

## 2021-06-13 NOTE — Progress Notes (Signed)
Initial Nutrition Assessment  DOCUMENTATION CODES:  Severe malnutrition in context of chronic illness  INTERVENTION:  Please obtain new measured weight so weight history can be accurately assessed   Provide bolus tube feeding via PEG: -1 carton (285ml) Osmolite 1.5 five times daily, flush with 71ml free water before and after each bolus -46ml Prosource TF (or equivalent) daily  TF provides 1815 kcals, 85g protein, 1561ml free water (May need to adjust free water flushes depending on pt's blood work and whether pt is receiving IVF)  -Nutrisource fiber BID per tube  NUTRITION DIAGNOSIS:  Severe Malnutrition related to chronic illness (h/o esophageal cancer and dysphagia) as evidenced by severe fat depletion, severe muscle depletion.  GOAL:  Patient will meet greater than or equal to 90% of their needs  MONITOR:  Labs, Weight trends, TF tolerance, I & O's  REASON FOR ASSESSMENT:  Consult Enteral/tube feeding initiation and management  ASSESSMENT:  Pt with PMH significant for esophageal cancer and dysphagia s/p PEG placement  admitted with hypercalcemia and hyponatremia 2/2 diarrhea x3 days.  Palliative Medicine Team has been consulted to determine [place] due to FTT.   Pt's home tube feeding regimen has been as follows: 1 carton Osmolite 1.5 via PEG 4-6 times daily.   Reviewed weight history. Per available weight readings, pt weighed 44.2 kg on 11/06/20 and now weighs 68.9kg. This indicates a 56% increase in weight over 7 months; however, suspect admit weight was estimated rather than measured and suspect it is an overestimation of pt's weight based on his weight history (see below). Will utilize weight from previous admission (44.2 kg) to estimate needs and request a new measured weight be obtained so weight history can be more accurately assessed.   Medications: thiamine IVF: NS @ 156ml/hr Labs: Na 129 (L), Corrected Ca 11.74 (H)  NUTRITION - FOCUSED PHYSICAL EXAM: Flowsheet  Row Most Recent Value  Orbital Region Severe depletion  Upper Arm Region Severe depletion  Thoracic and Lumbar Region Severe depletion  Buccal Region Severe depletion  Temple Region Severe depletion  Clavicle Bone Region Severe depletion  Clavicle and Acromion Bone Region Severe depletion  Scapular Bone Region Severe depletion  Dorsal Hand Severe depletion  Patellar Region Severe depletion  Anterior Thigh Region Severe depletion  Posterior Calf Region Severe depletion  Edema (RD Assessment) None  Hair Reviewed  Eyes Reviewed  Mouth Reviewed  Skin Reviewed  Nails Reviewed      Diet Order:   Diet Order             Diet NPO time specified  Diet effective now                  EDUCATION NEEDS:  No education needs have been identified at this time  Skin:  Skin Assessment: Reviewed RN Assessment  Last BM:  PTA  Height:  Ht Readings from Last 1 Encounters:  06/12/21 5\' 10"  (1.778 m)    Weight:  Wt Readings from Last 10 Encounters:  06/12/21 68.9 kg  11/06/20 44.2 kg  09/26/20 42.6 kg  09/05/20 45.5 kg  09/02/20 45.5 kg  07/08/20 44.4 kg  06/19/20 42.6 kg  06/03/20 44.1 kg  04/15/20 45.9 kg  12/09/18 47.9 kg   BMI:  Body mass index is 21.81 kg/m.  Estimated Nutritional Needs:  Kcal:  1650-1850 Protein:  85-95g Fluid:  >1.65L/d    Larkin Ina, MS, RD, LDN (she/her/hers) RD pager number and weekend/on-call pager number located in [place].

## 2021-06-14 ENCOUNTER — Other Ambulatory Visit: Payer: Self-pay

## 2021-06-14 DIAGNOSIS — Z515 Encounter for palliative care: Secondary | ICD-10-CM

## 2021-06-14 DIAGNOSIS — R531 Weakness: Secondary | ICD-10-CM

## 2021-06-14 DIAGNOSIS — Z7189 Other specified counseling: Secondary | ICD-10-CM

## 2021-06-14 DIAGNOSIS — E871 Hypo-osmolality and hyponatremia: Principal | ICD-10-CM

## 2021-06-14 LAB — C DIFFICILE QUICK SCREEN W PCR REFLEX
C Diff antigen: NEGATIVE
C Diff interpretation: NOT DETECTED
C Diff toxin: NEGATIVE

## 2021-06-14 LAB — CBC
HCT: 29 % — ABNORMAL LOW (ref 39.0–52.0)
Hemoglobin: 9.5 g/dL — ABNORMAL LOW (ref 13.0–17.0)
MCH: 32.6 pg (ref 26.0–34.0)
MCHC: 32.8 g/dL (ref 30.0–36.0)
MCV: 99.7 fL (ref 80.0–100.0)
Platelets: 397 10*3/uL (ref 150–400)
RBC: 2.91 MIL/uL — ABNORMAL LOW (ref 4.22–5.81)
RDW: 12.3 % (ref 11.5–15.5)
WBC: 9.1 10*3/uL (ref 4.0–10.5)
nRBC: 0 % (ref 0.0–0.2)

## 2021-06-14 LAB — BASIC METABOLIC PANEL
Anion gap: 5 (ref 5–15)
Anion gap: 4 — ABNORMAL LOW (ref 5–15)
BUN: 14 mg/dL (ref 8–23)
BUN: 15 mg/dL (ref 8–23)
CO2: 24 mmol/L (ref 22–32)
CO2: 25 mmol/L (ref 22–32)
Calcium: 10.1 mg/dL (ref 8.9–10.3)
Calcium: 9.9 mg/dL (ref 8.9–10.3)
Chloride: 98 mmol/L (ref 98–111)
Chloride: 99 mmol/L (ref 98–111)
Creatinine, Ser: 0.61 mg/dL (ref 0.61–1.24)
Creatinine, Ser: 0.76 mg/dL (ref 0.61–1.24)
GFR, Estimated: >60 mL/min (ref >60)
GFR, Estimated: >60 mL/min (ref >60)
Glucose, Bld: 98 mg/dL (ref 70–99)
Glucose, Bld: 135 mg/dL — ABNORMAL HIGH (ref 70–99)
Potassium: 4.2 mmol/L (ref 3.5–5.1)
Potassium: 3.9 mmol/L (ref 3.5–5.1)
Sodium: 127 mmol/L — ABNORMAL LOW (ref 135–145)
Sodium: 128 mmol/L — ABNORMAL LOW (ref 135–145)

## 2021-06-14 LAB — GLUCOSE, CAPILLARY
Glucose-Capillary: 86 mg/dL (ref 70–99)
Glucose-Capillary: 118 mg/dL — ABNORMAL HIGH (ref 70–99)
Glucose-Capillary: 159 mg/dL — ABNORMAL HIGH (ref 70–99)
Glucose-Capillary: 125 mg/dL — ABNORMAL HIGH (ref 70–99)
Glucose-Capillary: 122 mg/dL — ABNORMAL HIGH (ref 70–99)

## 2021-06-14 LAB — PTH, INTACT AND CALCIUM
Calcium, Total (PTH): 10.7 mg/dL — ABNORMAL HIGH (ref 8.6–10.2)
PTH: 33 pg/mL (ref 15–65)

## 2021-06-14 MED ORDER — LOPERAMIDE HCL 1 MG/7.5ML PO SUSP: 2.0000 mg | ORAL | Status: DC | PRN

## 2021-06-14 MED ORDER — LOPERAMIDE HCL 2 MG PO CAPS
2.0000 mg | ORAL_CAPSULE | ORAL | Status: DC | PRN
  Administered 2021-06-14: 2 mg via ORAL
  Filled 2021-06-14: qty 1

## 2021-06-14 MED ORDER — SODIUM CHLORIDE 0.9 % IV SOLN
510.0000 mg | Freq: Once | INTRAVENOUS | Status: AC
  Administered 2021-06-14: 510 mg via INTRAVENOUS
  Filled 2021-06-14: qty 510

## 2021-06-14 NOTE — Progress Notes (Signed)
[facility]  PROGRESS NOTE  Daniel [name] MWN:[id number] DOB: Jan 30, 1941 DOA: 06/12/2021 PCP: [name], MD   Brief HPI:   80 year old male with medical history of esophageal carcinoma, s/p PEG placement, atrial fibrillation presented with diarrhea and generalized weakness for 3 days.  He has been getting Osmolite per tube 4-6 times daily  In the ED was found to be hyponatremic.    Subjective   Patient seen and examined, continues to have diarrhea.  Stool for C. difficile PCR was obtained which was negative.   Assessment/Plan:     Hyponatremia -Hypovolemic hyponatremia -Likely from underlying diarrhea -Patient started on normal saline at 100 mL/h -Sodium has improved to 128 today -Urine osmolality 363; urine sodium 77   History of esophageal cancer -Patient has dysphagia, he is PEG tube dependent -Continue tube feeding  Diarrhea Stool for C. difficile is negative -Start Imodium as needed  Hypercalcemia -Calcium was 10.1 yesterday -Improved to 9.9 today with IV fluids -PTH 33  Normocytic anemia -Hemoglobin stable at 9.5 -Serum iron is 23, saturation ration was 8 -We will give IV Feraheme 510 mg x 1    Scheduled medications:    dronedarone  400 mg Per Tube BID   feeding supplement (OSMOLITE 1.5 CAL)  237 mL Per Tube 5 X Daily   feeding supplement (PROSource TF)  45 mL Per Tube Daily   fiber  1 packet Per Tube BID   metoprolol tartrate  25 mg Per Tube BID   thiamine  100 mg Per Tube Daily         Data Reviewed:   CBG:  Recent Labs  Lab 06/14/21 0453 06/14/21 0724 06/14/21 1133  GLUCAP 86 118* 159*    SpO2: 97 %    Vitals:   06/13/21 2221 06/14/21 0456 06/14/21 0500 06/14/21 1339  BP: (!) 155/84 (!) 146/70  135/74  Pulse: 100 84  87  Resp: 20 17  17   Temp: 98 F (36.7 C) (!) 97.5 F (36.4 C)  97.7 F (36.5 C)  TempSrc: Oral Oral  Oral  SpO2: 98% 99%  97%  Weight:   47.5 kg   Height:         Intake/Output  Summary (Last 24 hours) at 06/14/2021 1622 Last data filed at 06/14/2021 1327 Gross per 24 hour  Intake 3155.16 ml  Output 2300 ml  Net 855.16 ml    06/23 1901 - 06/25 0700 In: 1496.8 [I.V.:609.8] Out: 1600 [Urine:1000; Drains:600]  Filed Weights   06/12/21 2228 06/14/21 0500  Weight: 68.9 kg 47.5 kg    CBC:  Recent Labs  Lab 06/13/21 0002 06/14/21 0556  WBC 8.9 9.1  HGB 9.5* 9.5*  HCT 28.3* 29.0*  PLT 384 397  MCV 98.3 99.7  MCH 33.0 32.6  MCHC 33.6 32.8  RDW 12.3 12.3  LYMPHSABS 0.7  --   MONOABS 0.7  --   EOSABS 0.0  --   BASOSABS 0.0  --     Complete metabolic panel:  Recent Labs  Lab 06/12/21 2232 06/13/21 1105 06/13/21 1450 06/13/21 2218 06/14/21 0556 06/14/21 0648  NA 124* 129* 127* 128* 127* 128*  K 5.0 4.5 4.4 4.0 4.2 3.9  CL 91* 93* 95* 97* 98 99  CO2 27 26 24 25 24 25   GLUCOSE 96 109* 101* 118* 98 135*  BUN 20 15 15 17 14 15   CREATININE 0.89 0.90 0.74 0.76 0.61 0.76  CALCIUM 10.6* 11.1*  10.7* 10.5* 10.2 10.1 9.9  AST 31  --   --   --   --   --  ALT 12  --   --   --   --   --   ALKPHOS 79  --   --   --   --   --   BILITOT 0.4  --   --   --   --   --   ALBUMIN 3.2*  --   --   --   --   --   MG  --   --  2.2  --   --   --   BNP 32.3  --   --   --   --   --     No results for input(s): LIPASE, AMYLASE in the last 168 hours.  Recent Labs  Lab 06/12/21 2232 06/13/21 0002  BNP 32.3  --   SARSCOV2NAA  --  NEGATIVE    ------------------------------------------------------------------------------------------------------------------ No results for input(s): CHOL, HDL, LDLCALC, TRIG, CHOLHDL, LDLDIRECT in the last 72 hours.  Lab Results  Component Value Date   HGBA1C (H) 05/26/2010    6.0 (NOTE)                                                                       According to the ADA Clinical Practice Recommendations for 2011, when HbA1c is used as a screening test:   >=6.5%   Diagnostic of Diabetes Mellitus           (if abnormal  result  is confirmed)  5.7-6.4%   Increased risk of developing Diabetes Mellitus  References:Diagnosis and Classification of Diabetes Mellitus,Diabetes Care,2011,34(Suppl 1):S62-S69 and Standards of Medical Care in         Diabetes - 2011,Diabetes JXBJ,4782,95  (Suppl 1):S11-S61.   ------------------------------------------------------------------------------------------------------------------ No results for input(s): TSH, T4TOTAL, T3FREE, THYROIDAB in the last 72 hours.  Invalid input(s): FREET3 ------------------------------------------------------------------------------------------------------------------ Recent Labs    06/13/21 1105  TIBC 292  IRON 23*    Coagulation profile No results for input(s): INR, PROTIME in the last 168 hours. No results for input(s): DDIMER in the last 72 hours.  Cardiac Enzymes No results for input(s): CKTOTAL, CKMB, CKMBINDEX, TROPONINI in the last 168 hours.  ------------------------------------------------------------------------------------------------------------------    Component Value Date/Time   BNP 32.3 06/12/2021 2232     Antibiotics: Anti-infectives (From admission, onward)    None        Radiology Reports  No results found.    DVT prophylaxis: SCDs  Code Status: DNR  Family Communication: No family at bedside   Consultants:   Procedures:     Objective    Physical Examination:   General-appears in no acute distress Heart-S1-S2, regular, no murmur auscultated Lungs-clear to auscultation bilaterally, no wheezing or crackles auscultated Abdomen-soft, nontender, PEG tube in place Extremities-no edema in the lower extremities Neuro-alert, oriented x3, no focal deficit noted  Status is: Inpatient  Dispo: The patient is from: Home              Anticipated d/c is to: Home              Anticipated d/c date is: 06/16/2021              Patient currently not stable for discharge  Barrier to  discharge-ongoing treatment for diarrhea, hyponatremia  COVID-19 Labs  No  results for input(s): DDIMER, FERRITIN, LDH, CRP in the last 72 hours.  Lab Results  Component Value Date   [place] NEGATIVE 06/13/2021   [place] NEGATIVE 11/02/2020   [place] NEGATIVE 09/23/2020   [place] NEGATIVE 08/06/2020    Microbiology  Recent Results (from the past 240 hour(s))  Resp Panel by RT-PCR (Flu A&B, Covid) Nasopharyngeal Swab     Status: None   Collection Time: 06/13/21 12:02 AM   Specimen: Nasopharyngeal Swab; Nasopharyngeal(NP) swabs in vial transport medium  Result Value Ref Range Status   SARS Coronavirus 2 by RT PCR NEGATIVE NEGATIVE Final    Comment: (NOTE) SARS-CoV-2 target nucleic acids are NOT DETECTED.  The SARS-CoV-2 RNA is generally detectable in upper respiratory specimens during the acute phase of infection. The lowest concentration of SARS-CoV-2 viral copies this assay can detect is 138 copies/mL. A negative result does not preclude SARS-Cov-2 infection and should not be used as the sole basis for treatment or other patient management decisions. A negative result may occur with  improper specimen collection/handling, submission of specimen other than nasopharyngeal swab, presence of viral mutation(s) within the areas targeted by this assay, and inadequate number of viral copies(<138 copies/mL). A negative result must be combined with clinical observations, patient history, and epidemiological information. The expected result is Negative.  Fact Sheet for Patients:  EntrepreneurPulse.com.au  Fact Sheet for Healthcare Providers:  IncredibleEmployment.be  This test is no t yet approved or cleared by the [place] FDA and  has been authorized for detection and/or diagnosis of SARS-CoV-2 by FDA under an Emergency Use Authorization (EUA). This EUA will remain  in effect (meaning this test can be used) for the duration  of the COVID-19 declaration under Section 564(b)(1) of the Act, 21 U.S.C.section 360bbb-3(b)(1), unless the authorization is terminated  or revoked sooner.       Influenza A by PCR NEGATIVE NEGATIVE Final   Influenza B by PCR NEGATIVE NEGATIVE Final    Comment: (NOTE) The Xpert Xpress SARS-CoV-2/FLU/RSV plus assay is intended as an aid in the diagnosis of influenza from Nasopharyngeal swab specimens and should not be used as a sole basis for treatment. Nasal washings and aspirates are unacceptable for Xpert Xpress SARS-CoV-2/FLU/RSV testing.  Fact Sheet for Patients: EntrepreneurPulse.com.au  Fact Sheet for Healthcare Providers: IncredibleEmployment.be  This test is not yet approved or cleared by the [place] FDA and has been authorized for detection and/or diagnosis of SARS-CoV-2 by FDA under an Emergency Use Authorization (EUA). This EUA will remain in effect (meaning this test can be used) for the duration of the COVID-19 declaration under Section 564(b)(1) of the Act, 21 U.S.C. section 360bbb-3(b)(1), unless the authorization is terminated or revoked.  Performed at [facility], [place] [address], [place], [place] [address]   C Difficile Quick Screen w PCR reflex     Status: None   Collection Time: 06/14/21  9:55 AM  Result Value Ref Range Status   C Diff antigen NEGATIVE NEGATIVE Final   C Diff toxin NEGATIVE NEGATIVE Final   C Diff interpretation No C. difficile detected.  Final    Comment: Performed at [facility], [place] [address] [place],  [address]             Oswald Hillock   [facility] If 7PM-7AM, please contact night-coverage at [email or web address], Office  [phone]   06/14/2021, 4:22 PM  LOS: 1 day

## 2021-06-14 NOTE — Evaluation (Signed)
Physical Therapy Evaluation Patient Details Name: Daniel [name] MRN: [id number] DOB: [birth year] Today's Date: 06/14/2021   History of Present Illness  80 yo male presents to [facility] on 6/23 with diarrhea, weakness x3 days. Workup for hyponatremia. PMH includes esophageal cancer, PEG placement, afib, HF, cardiomyopathy, PAD.  Clinical Impression   Pt presents with impaired strength, poor standing balance with pt-reported history of falls, decreased activity tolerance. Pt to benefit from acute PT to address deficits. Pt requiring min guard to min assist for stand pivot OOB to recliner today, further mobility deferred due to pt fatigue. Pt reporting mobility with AD as needed at home, and states "yes" he needs help with things but does not elaborate when cued. PT to progress mobility as tolerated, and will continue to follow acutely.      Follow Up Recommendations Home health PT;Supervision/Assistance - 24 hour (vs ST-SNF for rehab if pt's son not able to assist - will need to discuss with pt's son, did not answer PT phone call)    Equipment Recommendations  None recommended by PT    Recommendations for Other Services       Precautions / Restrictions Precautions Precautions: Fall Precaution Comments: PEG tube Restrictions Weight Bearing Restrictions: No      Mobility  Bed Mobility Overal bed mobility: Needs Assistance Bed Mobility: Supine to Sit     Supine to sit: HOB elevated;Min guard     General bed mobility comments: for safety, pt using bedrails and increased time to perform mobility tasks    Transfers Overall transfer level: Needs assistance Equipment used: Rolling walker (2 wheeled) Transfers: Sit to/from [facility] Sit to Stand: Min assist Stand pivot transfers: Min assist       General transfer comment: min assist for rise, steady, and pivotal steps to reach recliner. Pt taking very short, shuffling steps  Ambulation/Gait              General Gait Details: NT - pt fatigue and minimal interaction with PT  Stairs            Wheelchair Mobility    Modified Rankin (Stroke Patients Only)       Balance Overall balance assessment: Needs assistance;History of Falls (pt states "yes" he has fallen) Sitting-balance support: No upper extremity supported;Feet supported Sitting balance-Leahy Scale: Fair     Standing balance support: Bilateral upper extremity supported;During functional activity Standing balance-Leahy Scale: Poor Standing balance comment: reliant on external support                             Pertinent Vitals/Pain Pain Assessment: Faces Faces Pain Scale: Hurts a little bit Pain Location: back Pain Descriptors / Indicators: Sore;Discomfort;Grimacing Pain Intervention(s): Limited activity within patient's tolerance;Monitored during session;Repositioned    Home Living Family/patient expects to be discharged to:: Private residence Living Arrangements: Children (son) Available Help at Discharge: Family;Other (Comment) (pt states son works "sometimes", states he works from home. Unsure of accuracy of this) Type of Home: House Home Access: Stairs to enter Entrance Stairs-Rails: Chemical engineer of Steps: 6 Home Layout: One level Home Equipment: Environmental consultant - 2 wheels;Bedside commode;Wheelchair - manual Additional Comments: pt nods "yes" to having this equipment, unsure of accuracy of this    Prior Function Level of Independence: Needs assistance   Gait / Transfers Assistance Needed: pt reports using AD as needed at home, states he has a w/c? Unsure of accuracy - attempted to call pt's son,  got voicemail  ADL's / Homemaking Assistance Needed: pt nods "yes" his son helps him, but does not state with what  Comments: Unsure of accuracy of PLOF and subjective information- attempted to call pt's son, got voicemail     Hand Dominance   Dominant Hand: Right     Extremity/Trunk Assessment   Upper Extremity Assessment Upper Extremity Assessment: Defer to OT evaluation    Lower Extremity Assessment Lower Extremity Assessment: Generalized weakness    Cervical / Trunk Assessment Cervical / Trunk Assessment: Kyphotic  Communication   Communication: No difficulties;Other (comment) (hoarse voice quality)  Cognition Arousal/Alertness: Awake/alert Behavior During Therapy: Flat affect Overall Cognitive Status: No family/caregiver present to determine baseline cognitive functioning                                 General Comments: Pt nodding "yes" to a majority of subjective questions, minimally verbal with PT. Pt states he is at  for PT      General Comments      Exercises General Exercises - Lower Extremity Long Arc Quad: AROM;Both;10 reps;Seated   Assessment/Plan    PT Assessment Patient needs continued PT services  PT Problem List Decreased strength;Decreased mobility;Decreased safety awareness;Decreased activity tolerance;Decreased balance;Decreased cognition;Decreased knowledge of use of DME;Pain       PT Treatment Interventions DME instruction;Therapeutic activities;Gait training;Therapeutic exercise;Patient/family education;Balance training;Stair training;Functional mobility training;Neuromuscular re-education    PT Goals (Current goals can be found in the Care Plan section)  Acute Rehab PT Goals Patient Stated Goal: go home with son PT Goal Formulation: With patient Time For Goal Achievement: 06/28/21 Potential to Achieve Goals: Good    Frequency Min 3X/week   Barriers to discharge        Co-evaluation               AM-PAC PT "6 Clicks" Mobility  Outcome Measure Help needed turning from your back to your side while in a flat bed without using bedrails?: A Little Help needed moving from lying on your back to sitting on the side of a flat bed without using bedrails?: A Little Help needed  moving to and from a bed to a chair (including a wheelchair)?: A Little Help needed standing up from a chair using your arms (e.g., wheelchair or bedside chair)?: A Little Help needed to walk in hospital room?: A Little Help needed climbing 3-5 steps with a railing? : A Lot 6 Click Score: 17    End of Session   Activity Tolerance: Patient tolerated treatment well;Patient limited by fatigue Patient left: in chair;with call bell/phone within reach;with chair alarm set Nurse Communication: Mobility status PT Visit Diagnosis: Other abnormalities of gait and mobility (R26.89);Difficulty in walking, not elsewhere classified (R26.2)    Time: 4709-6283 PT Time Calculation (min) (ACUTE ONLY): 19 min   Charges:   PT Evaluation $PT Eval Low Complexity: 1 Low        Siniya Lichty S, PT DPT Acute Rehabilitation Services Pager [phone]  Office [phone]   Quail E Ruffin Pyo 06/14/2021, 11:02 AM
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
## 2021-06-14 NOTE — Progress Notes (Signed)
OT Cancellation Note  Patient Details Name: Daniel [name] MRN: [id number] DOB: 1941-10-04   Cancelled Treatment:    Reason Eval/Treat Not Completed: Fatigue/lethargy limiting ability to participate: Attempted OT Evaluation in AM and pt in chair just having completed PT assessment. Pt asked for rest break.  Attempted pt in PM and pt had just returned to bed and refused OT for day due to fatigue. Pt agreeable to OT attempting tomorrow after 1100AM. Will return as able per pt's preferences.   Julien Girt 06/14/2021, 1:48 PM

## 2021-06-15 DIAGNOSIS — K5641 Fecal impaction: Secondary | ICD-10-CM

## 2021-06-15 LAB — COMPREHENSIVE METABOLIC PANEL
ALT: 13 U/L (ref 0–44)
AST: 29 U/L (ref 15–41)
Albumin: 3 g/dL — ABNORMAL LOW (ref 3.5–5.0)
Alkaline Phosphatase: 71 U/L (ref 38–126)
Anion gap: 7 (ref 5–15)
BUN: 13 mg/dL (ref 8–23)
CO2: 22 mmol/L (ref 22–32)
Calcium: 9.8 mg/dL (ref 8.9–10.3)
Chloride: 98 mmol/L (ref 98–111)
Creatinine, Ser: 0.72 mg/dL (ref 0.61–1.24)
GFR, Estimated: >60 mL/min (ref >60)
Glucose, Bld: 108 mg/dL — ABNORMAL HIGH (ref 70–99)
Potassium: 4.4 mmol/L (ref 3.5–5.1)
Sodium: 127 mmol/L — ABNORMAL LOW (ref 135–145)
Total Bilirubin: 0.7 mg/dL (ref 0.3–1.2)
Total Protein: 5.8 g/dL — ABNORMAL LOW (ref 6.5–8.1)

## 2021-06-15 LAB — GLUCOSE, CAPILLARY
Glucose-Capillary: 160 mg/dL — ABNORMAL HIGH (ref 70–99)
Glucose-Capillary: 105 mg/dL — ABNORMAL HIGH (ref 70–99)
Glucose-Capillary: 172 mg/dL — ABNORMAL HIGH (ref 70–99)
Glucose-Capillary: 76 mg/dL (ref 70–99)
Glucose-Capillary: 132 mg/dL — ABNORMAL HIGH (ref 70–99)

## 2021-06-15 LAB — CBC
HCT: 28 % — ABNORMAL LOW (ref 39.0–52.0)
Hemoglobin: 9.4 g/dL — ABNORMAL LOW (ref 13.0–17.0)
MCH: 33 pg (ref 26.0–34.0)
MCHC: 33.6 g/dL (ref 30.0–36.0)
MCV: 98.2 fL (ref 80.0–100.0)
Platelets: 361 10*3/uL (ref 150–400)
RBC: 2.85 MIL/uL — ABNORMAL LOW (ref 4.22–5.81)
RDW: 12.4 % (ref 11.5–15.5)
WBC: 10 10*3/uL (ref 4.0–10.5)
nRBC: 0 % (ref 0.0–0.2)

## 2021-06-15 NOTE — Evaluation (Addendum)
Occupational Therapy Evaluation Patient Details Name: Daniel [name] MRN: [id number] DOB: 03/16/1941 Today's Date: 06/15/2021    History of Present Illness 80 yo male [facility] with diarrhea, weakness x3 days. Workup for hyponatremia. PMH includes esophageal cancer, PEG placement, afib, HF, cardiomyopathy, PAD.   Clinical Impression   Patient is currently requiring assistance with ADLs including Total assist with toileting,  LE dressing at bed level, Max to total assist with bed level sponge bathing, and and maximum assist with grooming and UE dressing at bed level, all of which may be below patient's typical baseline which is currently unknown.  During this evaluation, patient was limited by severe generalized weakness, poor activity tolerance, shortness of breath with speech and light activity, incontinence of bowel and lethargy, which has the potential to impact patient's safety and independence during functional mobility, as well as performance for ADLs. [place] "6-clicks" Daily Activity Inpatient Short Form score of 8/24 this session. Patient lives with his son, who PT and OT have been unable to reach by phone, and son's availability is unknown.  Pt informed PT that pt moved into son's house and informed this OT that son moved into pt's apartment.  Currently, the Patient requires almost total care and may have needs beyond what pt's son can  safely provide even with 24/7 care.  Pt demonstrates fair rehab potential, and should benefit from continued skilled occupational therapy services while in acute care to maximize safety, independence and quality of life at home.  Continued occupational therapy services in a SNF vs LTACH setting prior to return home is recommended.  *Pt's son returned OT's call after this note written.  Son confirmed that he now lives in pt's apartment and gives 24/7 care and close supervision. Son does not go to work.  Son corrected pt's Home info  to include: No Wheelchair.  Available DME: Shower chair to walk-in shower, Bedside commode,RW, [facility] and Hospital bed.  Pt was fairly independent until ~2 months ago and then began requiring increased care from son and pt's estranged spouse began providing bed baths.  Pt is normally continent of bowel and bladder and uses standard height toilet with riser. Grab bars in walk in shower with seat and grab bars by commode.  Pt had one fall out of his bed ~5 months ago and went to hospital No other falls.  Pt has recently been ambulating with hand held assist from son at all times.   ?    Follow Up Recommendations  : Home Health PT and OT with 24/7 assistance which is available from son.    Equipment Recommendations   Corporate treasurer.)    Recommendations for Other Services       Precautions / Restrictions Precautions Precautions: Fall Precaution Comments: PEG tube Restrictions Weight Bearing Restrictions: No      Mobility Bed Mobility Overal bed mobility: Needs Assistance Bed Mobility: Supine to Sit     Supine to sit: HOB elevated;Max assist     General bed mobility comments: Cues for sequence and use of bed rails. Pt required Max As to advance LEs off bed and to rasie trunk.    Transfers Overall transfer level: Needs assistance Equipment used: Rolling walker (2 wheeled) Transfers: Sit to/from [facility] Sit to Stand: Min assist Stand pivot transfers: Mod assist       General transfer comment: min assist for rise, steady and cues for foot and hand placement; and Mod as to reach  recliner. Pt taking very short, shuffling steps with feet close together.    Balance Overall balance assessment: Needs assistance;History of Falls Sitting-balance support: Feet supported;Bilateral upper extremity supported;No upper extremity supported Sitting balance-Leahy Scale: Fair Sitting balance - Comments: Able to briefly release UEs from bed for ROM test, but  otherwise keeping BUEs on bed for balance.   Standing balance support: Bilateral upper extremity supported;During functional activity Standing balance-Leahy Scale: Poor Standing balance comment: reliant on external support                           ADL either performed or assessed with clinical judgement   ADL Overall ADL's : Needs assistance/impaired Eating/Feeding: NPO;Total assistance;Bed level Eating/Feeding Details (indicate cue type and reason): PEG feeds Grooming: Wash/dry hands;Sitting;Moderate assistance Grooming Details (indicate cue type and reason): Fatigues quickly Upper Body Bathing: Maximal assistance;Bed level   Lower Body Bathing: Total assistance;Bed level   Upper Body Dressing : Sitting;Maximal assistance Upper Body Dressing Details (indicate cue type and reason): To don anterior gown Lower Body Dressing: Total assistance;Bed level Lower Body Dressing Details (indicate cue type and reason): Pt stated "yes" when asked if he can don his socks. Pt still supine. handed socks and pt began picking at air for ~30 seconds, then picked up sock. Pt unable to open sock after several attempts. Pt handed sock to OT and stated "I can't". Toilet Transfer: Moderate assistance;RW Toilet Transfer Details (indicate cue type and reason): Pt transferred to recliner after peri care. Pt declined BSC due to fatigue. Mod As to pivot to recliner with RW. Toileting- Clothing Manipulation and Hygiene: Total assistance;Sit to/from stand Toileting - Clothing Manipulation Details (indicate cue type and reason): Pt incontinent of bowel once OOB. Pt stood with BUE support on RW, and required Total As for peri hgyiene. Pt still appeared to have stool in rectum but declined BSC due to fatigue. Pt repeated, "I got to rest." and assisted to chair.     Functional mobility during ADLs: Moderate assistance;Maximal assistance;Rolling walker       Vision   Additional Comments: Unable to assess.  Poor eye contact. Had difficulty locating bright yellow sock on lap. (contrasted on dark blue gown)     Perception     Praxis      Pertinent Vitals/Pain Pain Assessment: Faces Faces Pain Scale: Hurts little more Pain Location: Buttocks. Pt denied pain at rest. Pain Descriptors / Indicators: Sore;Discomfort;Grimacing Pain Intervention(s): Repositioned;Monitored during session;Limited activity within patient's tolerance     Hand Dominance Right   Extremity/Trunk Assessment Upper Extremity Assessment Upper Extremity Assessment: Generalized weakness (Limited shoulder ROM bilaterally to ~70 degrees. Very weak through UEs)   Lower Extremity Assessment Lower Extremity Assessment: Generalized weakness   Cervical / Trunk Assessment Cervical / Trunk Assessment: Kyphotic   Communication Communication Communication: Expressive difficulties;Other (comment) (Pt with increased effort to voice words. Limits speech due to voice fatigue and becomes easily SHOB with short responses.)   Cognition Arousal/Alertness: Lethargic Behavior During Therapy: Flat affect Overall Cognitive Status: No family/caregiver present to determine baseline cognitive functioning                                 General Comments: Pt now answering orientation questions. Pt able to staet his DOB and that he lives on "[place]" only.   General Comments       Exercises  Shoulder Instructions      Home Living Family/patient expects to be discharged to:: Skilled nursing facility Living Arrangements: Children (son who works) Available Help at Discharge: Family;Other (Comment);Available PRN/intermittently (Pt reports that son works and is "not always there." Presume pt means home) Type of Home:  (Pt informed PT that he moved into son's house.  Pt informed this OT that son moved into pt's ILF apartment.  Not a reliable historian at this time.)   *Updated home information above in [place]: [place] Rile - 2 wheels;Bedside commode;Wheelchair - manual   Additional Comments: pt nods "yes" to having this equipment, unsure of accuracy of this      Prior Functioning/Environment    Gait / Transfers Assistance Needed: pt reports using AD as needed at home, states he has a w/c? Unsure of accuracy - attempted to call pt's son, got voicemail     Comments: Unsure of accuracy of PLOF and subjective information- PT attempted to call pt's son on 6/25, got voicemail. OT attempted on 6/26-got voicemail, left message asking son to call hosptial to provide living situation information.        OT Problem List: Decreased strength;Pain;Cardiopulmonary status limiting activity;Decreased range of motion;Impaired sensation;Decreased activity tolerance;Impaired balance (sitting and/or standing);Decreased knowledge of use of DME or AE;Decreased knowledge of precautions      OT Treatment/Interventions: Self-care/ADL training;Therapeutic exercise;Therapeutic activities;Energy conservation;Patient/family education;DME and/or AE instruction;Balance training;Cognitive remediation/compensation    OT Goals(Current goals can be found in the care plan section) Acute Rehab OT Goals Patient Stated Goal: today, pt agreeable to continued care in SNF vs LTACH OT Goal Formulation: With patient Time For Goal Achievement: 06/29/21 Potential to Achieve Goals: Fair ADL Goals Pt Will Perform Grooming: sitting;with supervision Pt Will Perform Upper Body Dressing: with min guard assist;sitting;with set-up Pt Will Transfer to Toilet: stand pivot transfer;bedside commode;with supervision Pt Will Perform Toileting - Clothing Manipulation and hygiene: sit to/from stand;sitting/lateral leans;with min assist Additional ADL Goal #1: Pt will improve sitting balance to good static and fair+ dynamic for at least 8 min of EOB sitting in order to increase safety and participation during seated ADLs.  OT  Frequency: Min 2X/week   Barriers to D/C: Decreased caregiver support          Co-evaluation              AM-PAC OT "6 Clicks" Daily Activity     Outcome Measure Help from another person eating meals?: Total Help from another person taking care of personal grooming?: A Lot Help from another person toileting, which includes using toliet, bedpan, or urinal?: Total Help from another person bathing (including washing, rinsing, drying)?: Total Help from another person to put on and taking off regular upper body clothing?: A Lot Help from another person to put on and taking off regular lower body clothing?: Total 6 Click Score: 8   End of Session Equipment Utilized During Treatment: Gait belt;Rolling walker Nurse Communication: Mobility status (Stool in rectum)  Activity Tolerance: Patient limited by fatigue;Patient limited by lethargy;Patient limited by pain Patient left: in chair;with call bell/phone within reach;with chair alarm set  OT Visit Diagnosis: Unsteadiness on feet (R26.81);Other symptoms and signs involving cognitive function;Pain;History of falling (Z91.81);Muscle weakness (generalized) (M62.81) Pain - part of body:  (buttocks)                Time: 0935-1006 OT  Time Calculation (min): 31 min Charges:  OT General Charges $OT Visit: 1 Visit OT Evaluation $OT Eval Low Complexity: 1 Low OT Treatments $Self Care/Home Management : 8-22 mins  Anderson Malta, OT Acute Rehab Services Office: [phone] 06/15/2021  Julien Girt 06/15/2021, 10:28 AM

## 2021-06-15 NOTE — TOC Initial Note (Signed)
Transition of Care [facility]) - Initial/Assessment Note    Patient Details  Name: Daniel [name] MRN: [id number] Date of Birth: Feb 17, 1941  [facility]) CM/SW Contact:    Lennart Pall, LCSW Phone Number: 06/15/2021, 12:33 PM  Clinical Narrative:                 TOC referral placed to assist with referral of pt to Hospice for home discharge.  Introduced self to pt, however, he is very lethargic and does not engage.  Contacted pt's son, [name], who confirms he has discussed plan with Dr. Rowe Pavy and in agreement with plan for pt to dc home under hospice care and request coverage by [facility]. I have made referral to Domenic Moras, RN with [facility] and noted that, per OT note, appears family has several DME items already in the home.  She will be reaching out to pt and son.  Appears pt may be ready for dc in next couple of days?  Expected Discharge Plan: Home w Hospice Care Barriers to Discharge: Continued Medical Work up   Patient Goals and CMS Choice Patient states their goals for this hospitalization and ongoing recovery are:: go home      Expected Discharge Plan and Services Expected Discharge Plan: [place] In-house Referral: Clinical Social Work   Post Acute Care Choice: Hospice Living arrangements for the past 2 months: [place]: RN [facility] Agency: Hospice and [place] [facility]) Date [place]: 06/15/21 Time [place]: 1232 Representative spoke with at [place]: Domenic Moras, RN  Prior Living Arrangements/Services Living arrangements for the past 2 months: [place] with:: Adult Children Patient language and need for interpreter reviewed:: Yes Do you feel safe going back to the place where you live?: Yes      Need for Family Participation in Patient Care: Yes (Comment) Care giver support system in place?: Yes (comment)   Criminal Activity/Legal  Involvement Pertinent to Current Situation/Hospitalization: No - Comment as needed  Activities of Daily Living Home Assistive Devices/Equipment: Walker (specify type), Eyeglasses, Feeding equipment (peg tube) ADL Screening (condition at time of admission) Patient's cognitive ability adequate to safely complete daily activities?: Yes Is the patient deaf or have difficulty hearing?: No Does the patient have difficulty seeing, even when wearing glasses/contacts?: No Does the patient have difficulty concentrating, remembering, or making decisions?: No Patient able to express need for assistance with ADLs?: Yes Does the patient have difficulty dressing or bathing?: Yes Independently performs ADLs?: No Communication: Independent Dressing (OT): Needs assistance Is this a change from baseline?: Change from baseline, expected to last >3 days Grooming: Needs assistance Is this a change from baseline?: Change from baseline, expected to last >3 days Feeding: Needs assistance Is this a change from baseline?: Change from baseline, expected to last >3 days Bathing: Needs assistance Is this a change from baseline?: Change from baseline, expected to last >3 days Toileting: Needs assistance Is this a change from baseline?: Change from baseline, expected to last >3days In/Out Bed: Needs assistance Is this a change from baseline?: Change from baseline, expected to last >3 days Walks in Home: Needs assistance Is this a change from baseline?: Change from baseline, expected to last >3 days Does the patient have difficulty walking or climbing stairs?: Yes (secondary to weakness)  Weakness of Legs: Both Weakness of Arms/Hands: None  Permission Sought/Granted Permission sought to share information with : Family Supports Permission granted to share information with : Yes, Verbal Permission Granted  Share Information with NAME: Samantha Crimes     Permission granted to share info w Relationship:  son  Permission granted to share info w Contact Information: [phone]  Emotional Assessment Appearance:: Appears stated age Attitude/Demeanor/Rapport: Lethargic Affect (typically observed): Quiet Orientation: : Oriented to Self, Oriented to Place Alcohol / Substance Use: Not Applicable Psych Involvement: No (comment)  Admission diagnosis:  Fecal impaction ([place]) [K56.41] Hyponatremia [E87.1] Weakness [R53.1] Patient Active Problem List   Diagnosis Date Noted   Hyponatremia 06/13/2021   Aspiration pneumonia ([place]) 27/02/5008   Chronic systolic CHF (congestive heart failure) ([place]) 11/02/2020   Acute lower UTI 11/02/2020   Sepsis ([place]) 11/02/2020   AMS (altered mental status) 11/02/2020   AKI (acute kidney injury) ([place]) 11/02/2020   Hypercalcemia 11/02/2020   Fall at home, initial encounter 11/02/2020   DNR (do not resuscitate)    Malignant neoplasm of esophagus ([place]) 06/21/2020   Dysphagia 04/15/2020   Protein-calorie malnutrition, severe ([place]) 09/30/2018   Vitamin B12 deficiency 09/13/2015   Healthcare maintenance 03/03/2013   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   Benign prostatic hyperplasia 03/03/2012   Peripheral artery disease ([place]) 08/13/2011   Lung nodule 11/06/2010   Atrial fibrillation status post cardioversion ([place]) 06/05/2010   Hyperlipidemia 06/21/2007   Essential hypertension 05/25/2007   Tobacco abuse 12/08/2006   Osteoarthritis of thoracolumbar spine 12/08/2006   PCP:  [name], MD Pharmacy:   [place], [place] - [address] [place] [address] Phone: [phone] Fax: [place], [place] - [address] [facility] [address] [place] [address] Phone: [phone] Fax: [phone]     Social Determinants of Health (SDOH) Interventions    Readmission Risk Interventions No flowsheet  data found.

## 2021-06-15 NOTE — Progress Notes (Signed)
[facility]  PROGRESS NOTE  Daniel [name] BTD:[id number] DOB: Aug 20, 1941 DOA: 06/12/2021 PCP: [name], MD   Brief HPI:   80 year old male with medical history of esophageal carcinoma, s/p PEG placement, atrial fibrillation presented with diarrhea and generalized weakness for 3 days.  He has been getting Osmolite per tube 4-6 times daily  In the ED was found to be hyponatremic.    Subjective   Patient seen and examined, diarrhea has resolved.  Stool for C. difficile PCR negative.   Assessment/Plan:     Hyponatremia -Hypovolemic hyponatremia -Likely from underlying diarrhea -Patient started on normal saline at 100 mL/h -Sodium has improved to 127 -Urine osmolality 363; urine sodium 77   History of esophageal cancer -Patient has dysphagia, he is PEG tube dependent -Continue tube feeding  Diarrhea Stool for C. difficile PCR is negative -Start Imodium as needed  Hypercalcemia -Calcium is 9.8 today -Corrected calcium for albumin is 10.6 -Likely from underlying regnancy -Continue IV normal saline -PTH 33  Normocytic anemia -Hemoglobin stable at 9.5 -Serum iron is 23, saturation ration was 8 -Patient was given IV Feraheme 510 mg x 1  Goals of care -Patient to go home with home hospice  Scheduled medications:    dronedarone  400 mg Per Tube BID   feeding supplement (OSMOLITE 1.5 CAL)  237 mL Per Tube 5 X Daily   feeding supplement (PROSource TF)  45 mL Per Tube Daily   fiber  1 packet Per Tube BID   metoprolol tartrate  25 mg Per Tube BID   thiamine  100 mg Per Tube Daily         Data Reviewed:   CBG:  Recent Labs  Lab 06/14/21 2018 06/15/21 0033 06/15/21 0419 06/15/21 0714 06/15/21 1141  GLUCAP 122* 160* 105* 172* 76    SpO2: 97 %    Vitals:   06/14/21 2015 06/15/21 0414 06/15/21 0500 06/15/21 1310  BP: (!) 143/77 (!) 152/89  132/65  Pulse: 91 84  92  Resp: 17 15  15   Temp:    98 F (36.7 C)  TempSrc:    Oral   SpO2: 98% 95%  97%  Weight:   48.8 kg   Height:         Intake/Output Summary (Last 24 hours) at 06/15/2021 1350 Last data filed at 06/15/2021 1000 Gross per 24 hour  Intake 3310.34 ml  Output 1500 ml  Net 1810.34 ml    06/24 1901 - 06/26 0700 In: 5141.7 [I.V.:3430.7] Out: 3500 [Urine:2900; Drains:600]  Filed Weights   06/12/21 2228 06/14/21 0500 06/15/21 0500  Weight: 68.9 kg 47.5 kg 48.8 kg    CBC:  Recent Labs  Lab 06/13/21 0002 06/14/21 0556 06/15/21 0540  WBC 8.9 9.1 10.0  HGB 9.5* 9.5* 9.4*  HCT 28.3* 29.0* 28.0*  PLT 384 397 361  MCV 98.3 99.7 98.2  MCH 33.0 32.6 33.0  MCHC 33.6 32.8 33.6  RDW 12.3 12.3 12.4  LYMPHSABS 0.7  --   --   MONOABS 0.7  --   --   EOSABS 0.0  --   --   BASOSABS 0.0  --   --     Complete metabolic panel:  Recent Labs  Lab 06/12/21 2232 06/13/21 1105 06/13/21 1450 06/13/21 2218 06/14/21 0556 06/14/21 0648 06/15/21 0540  NA 124*   < > 127* 128* 127* 128* 127*  K 5.0   < > 4.4 4.0 4.2 3.9 4.4  CL 91*   < > 95*  97* 98 99 98  CO2 27   < > 24 25 24 25 22   GLUCOSE 96   < > 101* 118* 98 135* 108*  BUN 20   < > 15 17 14 15 13   CREATININE 0.89   < > 0.74 0.76 0.61 0.76 0.72  CALCIUM 10.6*   < > 10.5* 10.2 10.1 9.9 9.8  AST 31  --   --   --   --   --  29  ALT 12  --   --   --   --   --  13  ALKPHOS 79  --   --   --   --   --  71  BILITOT 0.4  --   --   --   --   --  0.7  ALBUMIN 3.2*  --   --   --   --   --  3.0*  MG  --   --  2.2  --   --   --   --   BNP 32.3  --   --   --   --   --   --    < > = values in this interval not displayed.    No results for input(s): LIPASE, AMYLASE in the last 168 hours.  Recent Labs  Lab 06/12/21 2232 06/13/21 0002  BNP 32.3  --   SARSCOV2NAA  --  NEGATIVE    ------------------------------------------------------------------------------------------------------------------ No results for input(s): CHOL, HDL, LDLCALC, TRIG, CHOLHDL, LDLDIRECT in the last 72 hours.  Lab Results   Component Value Date   HGBA1C (H) 05/26/2010    6.0 (NOTE)                                                                       According to the ADA Clinical Practice Recommendations for 2011, when HbA1c is used as a screening test:   >=6.5%   Diagnostic of Diabetes Mellitus           (if abnormal result  is confirmed)  5.7-6.4%   Increased risk of developing Diabetes Mellitus  References:Diagnosis and Classification of Diabetes Mellitus,Diabetes Care,2011,34(Suppl 1):S62-S69 and Standards of Medical Care in         Diabetes - 2011,Diabetes YQIH,4742,59  (Suppl 1):S11-S61.   ------------------------------------------------------------------------------------------------------------------ No results for input(s): TSH, T4TOTAL, T3FREE, THYROIDAB in the last 72 hours.  Invalid input(s): FREET3 ------------------------------------------------------------------------------------------------------------------ Recent Labs    06/13/21 1105  TIBC 292  IRON 23*    Coagulation profile No results for input(s): INR, PROTIME in the last 168 hours. No results for input(s): DDIMER in the last 72 hours.  Cardiac Enzymes No results for input(s): CKTOTAL, CKMB, CKMBINDEX, TROPONINI in the last 168 hours.  ------------------------------------------------------------------------------------------------------------------    Component Value Date/Time   BNP 32.3 06/12/2021 2232     Antibiotics: Anti-infectives (From admission, onward)    None        Radiology Reports  No results found.    DVT prophylaxis: SCDs  Code Status: DNR  Family Communication: No family at bedside   Consultants:   Procedures:     Objective    Physical Examination:  General-appears in no acute distress Heart-S1-S2, regular, no murmur auscultated Lungs-clear to auscultation bilaterally,  no wheezing or crackles auscultated Abdomen-soft, nontender, no organomegaly, PEG tube in  place Extremities-no edema in the lower extremities Neuro-alert, oriented x3, no focal deficit noted  Status is: Inpatient  Dispo: The patient is from: Home              Anticipated d/c is to: Home              Anticipated d/c date is: 06/16/2021              Patient currently not stable for discharge  Barrier to discharge-ongoing treatment for diarrhea, hyponatremia  COVID-19 Labs  No results for input(s): DDIMER, FERRITIN, LDH, CRP in the last 72 hours.  Lab Results  Component Value Date   [place] NEGATIVE 06/13/2021   [place] NEGATIVE 11/02/2020   [place] NEGATIVE 09/23/2020   [place] NEGATIVE 08/06/2020    Microbiology  Recent Results (from the past 240 hour(s))  Resp Panel by RT-PCR (Flu A&B, Covid) Nasopharyngeal Swab     Status: None   Collection Time: 06/13/21 12:02 AM   Specimen: Nasopharyngeal Swab; Nasopharyngeal(NP) swabs in vial transport medium  Result Value Ref Range Status   SARS Coronavirus 2 by RT PCR NEGATIVE NEGATIVE Final    Comment: (NOTE) SARS-CoV-2 target nucleic acids are NOT DETECTED.  The SARS-CoV-2 RNA is generally detectable in upper respiratory specimens during the acute phase of infection. The lowest concentration of SARS-CoV-2 viral copies this assay can detect is 138 copies/mL. A negative result does not preclude SARS-Cov-2 infection and should not be used as the sole basis for treatment or other patient management decisions. A negative result may occur with  improper specimen collection/handling, submission of specimen other than nasopharyngeal swab, presence of viral mutation(s) within the areas targeted by this assay, and inadequate number of viral copies(<138 copies/mL). A negative result must be combined with clinical observations, patient history, and epidemiological information. The expected result is Negative.  Fact Sheet for Patients:  EntrepreneurPulse.com.au  Fact Sheet for Healthcare  Providers:  IncredibleEmployment.be  This test is no t yet approved or cleared by the [place] FDA and  has been authorized for detection and/or diagnosis of SARS-CoV-2 by FDA under an Emergency Use Authorization (EUA). This EUA will remain  in effect (meaning this test can be used) for the duration of the COVID-19 declaration under Section 564(b)(1) of the Act, 21 U.S.C.section 360bbb-3(b)(1), unless the authorization is terminated  or revoked sooner.       Influenza A by PCR NEGATIVE NEGATIVE Final   Influenza B by PCR NEGATIVE NEGATIVE Final    Comment: (NOTE) The Xpert Xpress SARS-CoV-2/FLU/RSV plus assay is intended as an aid in the diagnosis of influenza from Nasopharyngeal swab specimens and should not be used as a sole basis for treatment. Nasal washings and aspirates are unacceptable for Xpert Xpress SARS-CoV-2/FLU/RSV testing.  Fact Sheet for Patients: EntrepreneurPulse.com.au  Fact Sheet for Healthcare Providers: IncredibleEmployment.be  This test is not yet approved or cleared by the [place] FDA and has been authorized for detection and/or diagnosis of SARS-CoV-2 by FDA under an Emergency Use Authorization (EUA). This EUA will remain in effect (meaning this test can be used) for the duration of the COVID-19 declaration under Section 564(b)(1) of the Act, 21 U.S.C. section 360bbb-3(b)(1), unless the authorization is terminated or revoked.  Performed at [facility], [place] [address] [place], [place] [address]   C Difficile Quick Screen w PCR reflex     Status: None   Collection Time:  06/14/21  9:55 AM  Result Value Ref Range Status   C Diff antigen NEGATIVE NEGATIVE Final   C Diff toxin NEGATIVE NEGATIVE Final   C Diff interpretation No C. difficile detected.  Final    Comment: Performed at [facility], [place] [address], [place], [place] [address]              Oswald Hillock   [facility] If 7PM-7AM, please contact night-coverage at [email or web address], Office  [phone]   06/15/2021, 1:50 PM  LOS: 2 days

## 2021-06-15 NOTE — Progress Notes (Signed)
Manufacturing engineer [facility]) Hospital Liaison: RN note     Notified by Transition of Care Manger of patient/family request for [facility] services at home after discharge. Chart and patient information under review by [facility] physician. Hospice eligibility pending at this time.  Visited pt at bedside; no family present.  Pt did not rouse to gentle touch or to name. Writer called [name] at [phone] to initiate education related to hospice philosophy, services and team approach to care.  No answer;  HIPAA compliant voicemail left; no return call by end of day.  [place] liaison team will reach out to pt's son tomorrow.  Per OT note patient currently has the following equipment in the home:   hospital bed, BSC, shower chair.  No wheelchair, but per TOC Lucy not needed prior to discharge.                 Please do not hesitate to call with questions.   Thank you for the opportunity to participate in this patient's care.  Domenic Moras, BSN, RN       [place] (listed on AMION under [phone]  [phone] (24h on call)

## 2021-06-16 ENCOUNTER — Telehealth: Payer: Self-pay | Admitting: *Deleted

## 2021-06-16 LAB — COMPREHENSIVE METABOLIC PANEL
ALT: 13 U/L (ref 0–44)
AST: 32 U/L (ref 15–41)
Albumin: 2.9 g/dL — ABNORMAL LOW (ref 3.5–5.0)
Alkaline Phosphatase: 72 U/L (ref 38–126)
Anion gap: 7 (ref 5–15)
BUN: 13 mg/dL (ref 8–23)
CO2: 20 mmol/L — ABNORMAL LOW (ref 22–32)
Calcium: 9.6 mg/dL (ref 8.9–10.3)
Chloride: 98 mmol/L (ref 98–111)
Creatinine, Ser: 0.62 mg/dL (ref 0.61–1.24)
GFR, Estimated: >60 mL/min (ref >60)
Glucose, Bld: 101 mg/dL — ABNORMAL HIGH (ref 70–99)
Potassium: 4.5 mmol/L (ref 3.5–5.1)
Sodium: 125 mmol/L — ABNORMAL LOW (ref 135–145)
Total Bilirubin: 0.4 mg/dL (ref 0.3–1.2)
Total Protein: 5.8 g/dL — ABNORMAL LOW (ref 6.5–8.1)

## 2021-06-16 LAB — GLUCOSE, CAPILLARY
Glucose-Capillary: 104 mg/dL — ABNORMAL HIGH (ref 70–99)
Glucose-Capillary: 123 mg/dL — ABNORMAL HIGH (ref 70–99)
Glucose-Capillary: 138 mg/dL — ABNORMAL HIGH (ref 70–99)
Glucose-Capillary: 122 mg/dL — ABNORMAL HIGH (ref 70–99)
Glucose-Capillary: 113 mg/dL — ABNORMAL HIGH (ref 70–99)
Glucose-Capillary: 115 mg/dL — ABNORMAL HIGH (ref 70–99)

## 2021-06-16 NOTE — H&P (Signed)
Manufacturing engineer [facility]) Hospital Liaison: RN note      Notified by Transition of Care Manger of patient/family request for [facility] services at home after discharge. Chart and patient information under review by [facility] physician. Hospice eligibility pending at this time.  Spoke with son [name] to initiate education related to hospice philosophy, services and approach to care. [name] is aware of hospice services but stated that if his dad was having a bad day that he may call [facility] to come out another day. [name] stated he is the primary caregiver and is comfortable giving the medications as he has been doing so. [facility] referral center will reach out. [name] does have [place] phone number and is primary contact.   Please send signed and completed DNR form home with patient/family. Patient will need prescriptions for discharge comfort medications.     Per discussion, patient will discharge via [facility] home to address listed in chart. There is no DME needed at this time.   Please feel free if any further hospice related questions.Thank you for this referral. Clementeen Hoof, BSN, [facility] [phone]

## 2021-06-16 NOTE — Telephone Encounter (Signed)
Daniel [name] with [facility] called in asking if Dr. Evette Doffing would be Attending of Record for them. Patient is to be discharged from hospital today and family is requesting Dr. Evette Doffing remain his PCP.

## 2021-06-16 NOTE — TOC Transition Note (Signed)
Transition of Care [facility]) - CM/SW Discharge Note   Patient Details  Name: Daniel [name] MRN: [id number] Date of Birth: 12/30/1940  Transition of Care [facility]) CM/SW Contact:  Lynnell Catalan, RN Phone Number: 06/16/2021, 12:48 PM   Clinical Narrative:     Pt to dc home with hospice today. Address in Epic confirmed with son Daniel [name]. Yellow DNR on chart for transport. [facility] contacted for dc.    Barriers to Discharge: Continued Medical Work up   Patient Goals and CMS Choice Patient states their goals for this hospitalization and ongoing recovery are:: go home      Discharge Plan and Services In-house Referral: Clinical Social Work   Post Acute Care Choice: Hospice                    HH Arranged: RN [facility] Agency: Hospice and [place] (now [facility]) Date [place]: 06/15/21 Time [place]: 1232 Representative spoke with at [place]: Domenic Moras, RN  Social Determinants of Health ([place]) Interventions     Readmission Risk Interventions No flowsheet data found.

## 2021-06-16 NOTE — Progress Notes (Signed)
This RN went to patients room to see if patient had woken up. Found patient to have taken off his gown. Asked patient if he was hot, patient stated he was not. Asked patient if he was in pain, patient stated he had some pain but did not wish to receive any pain medication at this time. This RN asked patient if there is anything I could do for him. Patient stated he did not need anything at the moment from this RN and would call if he needed something.

## 2021-06-16 NOTE — Telephone Encounter (Signed)
Absolutely.

## 2021-06-16 NOTE — Telephone Encounter (Signed)
Daniel [name] was notified at time of first call that PCP would be happy to be Attending of record if that was patient's choice.

## 2021-06-16 NOTE — Discharge Summary (Signed)
Physician Discharge Summary  Daniel [name] UXN:[id number] DOB: 12/04/1941 DOA: 06/12/2021  PCP: Daniel Filler, MD  Admit date: 06/12/2021 Discharge date: 06/16/2021  Time spent: 60 minutes  Recommendations for Outpatient Follow-up:  Patient to discharge home with hospice   Discharge Diagnoses:  Active Problems:   Hyponatremia   Discharge Condition: Stable  Diet recommendation: Tube feeding  Filed Weights   06/14/21 0500 06/15/21 0500 06/16/21 0500  Weight: 47.5 kg 48.8 kg 49.5 kg    History of present illness:  80 year old male with medical history of esophageal carcinoma, s/p PEG placement, atrial fibrillation presented with diarrhea and generalized weakness for 3 days.  He has been getting Osmolite per tube 4-6 times daily   In the ED was found to be hyponatremic.  Hospital Course:   Hyponatremia -Hypovolemic hyponatremia -Likely from underlying diarrhea -Patient started on normal saline at 100 mL/h -Sodium still 125 -Patient was transitioned to hospice with home     History of esophageal cancer -Patient has dysphagia, he is PEG tube dependent -Continue tube feeding -Plan to go home with home hospice   Diarrhea Stool for C. difficile PCR is negative -Improved with Imodium   Hypercalcemia -Calcium is 9.8 today -Corrected calcium for albumin is 10.6 -Likely from underlying regnancy -PTH 33   Normocytic anemia -Hemoglobin stable at 9.5 -Serum iron is 23, saturation ration was 8 -Patient was given IV Feraheme 510 mg x 1   Goals of care -Patient to go home with home hospice  Procedures:   Consultations: Palliative care  Discharge Exam: Vitals:   06/15/21 2135 06/16/21 0412  BP: (!) 124/54 (!) 148/96  Pulse: 96 99  Resp:  20  Temp:  97.8 F (36.6 C)  SpO2:  90%    General: Appears in no acute distress Cardiovascular: S1-S2, regular Respiratory: Clear to auscultation bilaterally  Discharge Instructions   Discharge Instructions      Diet - low sodium heart healthy   Complete by: As directed    Increase activity slowly   Complete by: As directed       Allergies as of 06/16/2021       Reactions   Amiodarone Other (See Comments)   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   metoprolol tartrate 25 mg/10 mL Susp Commonly known as: LOPRESSOR Place 10 mLs (25 mg total) into feeding tube 2 (two) times daily.   Multaq 400 MG tablet Generic drug: dronedarone PLACE ONE TABLET INTO FEEDING TUBE IN THE MORNING AND AT BEDTIME What changed:  how much to take how to take this when to take this additional instructions   oxyCODONE-acetaminophen 10-325 MG tablet Commonly known as: Percocet Take 1 tablet by mouth every 4 (four) hours as needed for pain.       ASK your doctor about these medications    polyethylene glycol 17 g packet Commonly known as: MIRALAX / GLYCOLAX Place 17 g into feeding tube daily as needed for mild constipation.       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  Significant Diagnostic Studies: No results found.  Microbiology: Recent Results (from the past 240 hour(s))  Resp Panel by RT-PCR (Flu A&B, Covid) Nasopharyngeal Swab     Status: None   Collection Time: 06/13/21 12:02 AM   Specimen: Nasopharyngeal Swab; Nasopharyngeal(NP) swabs in vial transport medium  Result Value Ref Range Status   SARS Coronavirus 2 by RT PCR NEGATIVE NEGATIVE Final    Comment: (NOTE) SARS-CoV-2 target nucleic acids are NOT DETECTED.  The SARS-CoV-2 RNA is generally detectable in upper  respiratory specimens during the acute phase of infection. The lowest concentration of SARS-CoV-2 viral copies this assay can detect is 138 copies/mL. A negative result does not preclude SARS-Cov-2 infection and should not be used as the sole basis for treatment or other patient management decisions. A negative result may occur with  improper specimen collection/handling, submission of specimen other than nasopharyngeal swab, presence of viral mutation(s) within the areas targeted by this assay, and inadequate number of viral copies(<138 copies/mL). A negative result must be combined with clinical observations, patient history, and epidemiological information. The expected result is Negative.  Fact Sheet for Patients:  EntrepreneurPulse.com.au  Fact Sheet for Healthcare Providers:  IncredibleEmployment.be  This test is no t yet approved or cleared by the [place] FDA and  has been authorized for detection and/or diagnosis of SARS-CoV-2 by FDA under an Emergency Use Authorization (EUA). This EUA will remain  in effect (meaning this test can be used) for the duration of the COVID-19 declaration under Section 564(b)(1) of the Act, 21 U.S.C.section 360bbb-3(b)(1), unless the authorization is terminated  or revoked sooner.       Influenza A by PCR NEGATIVE NEGATIVE Final   Influenza B by PCR NEGATIVE NEGATIVE Final    Comment: (NOTE) The Xpert Xpress SARS-CoV-2/FLU/RSV plus assay is intended as an aid in the diagnosis of influenza from Nasopharyngeal swab specimens and should not be used as a sole basis for treatment. Nasal washings and aspirates are unacceptable for Xpert Xpress SARS-CoV-2/FLU/RSV testing.  Fact Sheet for Patients: EntrepreneurPulse.com.au  Fact Sheet for Healthcare Providers: IncredibleEmployment.be  This test is not yet approved or cleared by the [place] FDA and has been  authorized for detection and/or diagnosis of SARS-CoV-2 by FDA under an Emergency Use Authorization (EUA). This EUA will remain in effect (meaning this test can be used) for the duration of the COVID-19 declaration under Section 564(b)(1) of the Act, 21 U.S.C. section 360bbb-3(b)(1), unless the authorization is terminated or revoked.  Performed at [facility], [place] [address], [place], [place] [address]   C Difficile Quick Screen w PCR reflex     Status: None   Collection Time: 06/14/21  9:55 AM  Result Value Ref Range Status   C Diff antigen NEGATIVE NEGATIVE Final   C Diff toxin NEGATIVE NEGATIVE Final   C Diff interpretation No C. difficile detected.  Final    Comment: Performed at [facility], [place] [address], [place], [place] [address]     Labs: Basic Metabolic Panel: Recent Labs  Lab 06/13/21 1450 06/13/21 2218 06/14/21 0556 06/14/21 0648 06/15/21 0540 06/16/21 0515  NA 127* 128* 127* 128* 127* 125*  K 4.4 4.0 4.2 3.9 4.4 4.5  CL 95* 97* 98 99 98 98  CO2 24 25 24 25 22  20*  GLUCOSE 101* 118* 98 135* 108* 101*  BUN 15 17 14 15 13 13   CREATININE 0.74 0.76 0.61 0.76 0.72 0.62  CALCIUM 10.5* 10.2 10.1 9.9 9.8  9.6  MG 2.2  --   --   --   --   --   PHOS 2.2*  --   --   --   --   --    Liver Function Tests: Recent Labs  Lab 06/12/21 2232 06/15/21 0540 06/16/21 0515  AST 31 29 32  ALT 12 13 13   ALKPHOS 79 71 72  BILITOT 0.4 0.7 0.4  PROT 6.4* 5.8* 5.8*  ALBUMIN 3.2* 3.0* 2.9*   No results for input(s): LIPASE, AMYLASE in the last 168 hours. No results for input(s): AMMONIA in the last 168 hours. CBC: Recent Labs  Lab 06/13/21 0002 06/14/21 0556 06/15/21 0540  WBC 8.9 9.1 10.0  NEUTROABS 7.3  --   --   HGB 9.5* 9.5* 9.4*  HCT 28.3* 29.0* 28.0*  MCV 98.3 99.7 98.2  PLT 384 397 361   Cardiac Enzymes: No results for input(s): CKTOTAL, CKMB, CKMBINDEX, TROPONINI in the last 168 hours. BNP: BNP (last 3  results) Recent Labs    06/12/21 2232  BNP 32.3    ProBNP (last 3 results) No results for input(s): PROBNP in the last 8760 hours.  CBG: Recent Labs  Lab 06/15/21 2016 06/16/21 0016 06/16/21 0409 06/16/21 0800 06/16/21 1126  GLUCAP 138* 123* 104* 122* 113*       Signed:  Oswald Hillock MD.  [facility] 06/16/2021, 12:01 PM

## 2021-06-16 NOTE — Progress Notes (Signed)
PMT progress note  Patient is resting in bed, no distress, appears weak, denies pain.  Chart reviewed, I have discussed with hospice liaisons.  BP (!) 148/96   Pulse 99   Temp 97.8 F (36.6 C) (Oral)   Resp 20   Ht 5\' 10"  (1.778 m)   Wt 49.5 kg   SpO2 90%   BMI 15.66 kg/m  Frail gentleman No distress Appears weak Regular work of breathing S 1 S 2  PPS 58% 80 year old gentleman with a life limiting history of esophageal cancer, status post PEG tube placement and he is on bolus tube feeds, also has atrial fibrillation.  Lives at home with his family and son [name] is his primary caregiver and also his stated healthcare power of attorney agent.  Patient presented with diarrhea and weakness and has been admitted to hospital medicine service for hyponatremia dehydration in the setting of esophageal cancer as well as for failure to thrive. Recommend home with hospice.  Prognosis less than 6 months, in my opinion.  Greater than 50%  of this time was spent counseling and coordinating care related to the above assessment and plan. 15 minutes spent.  Loistine Chance MD [place] [facility].

## 2021-07-21 IMAGING — DX DG CHEST 1V PORT
2 series · 2 of 2 positions shown · non-contrast
Comparison: 07/03/2020

CLINICAL DATA: Fatigue and confusion. History of esophageal cancer.

EXAM:
PORTABLE CHEST 1 VIEW

[chest ap (1 of 2)]
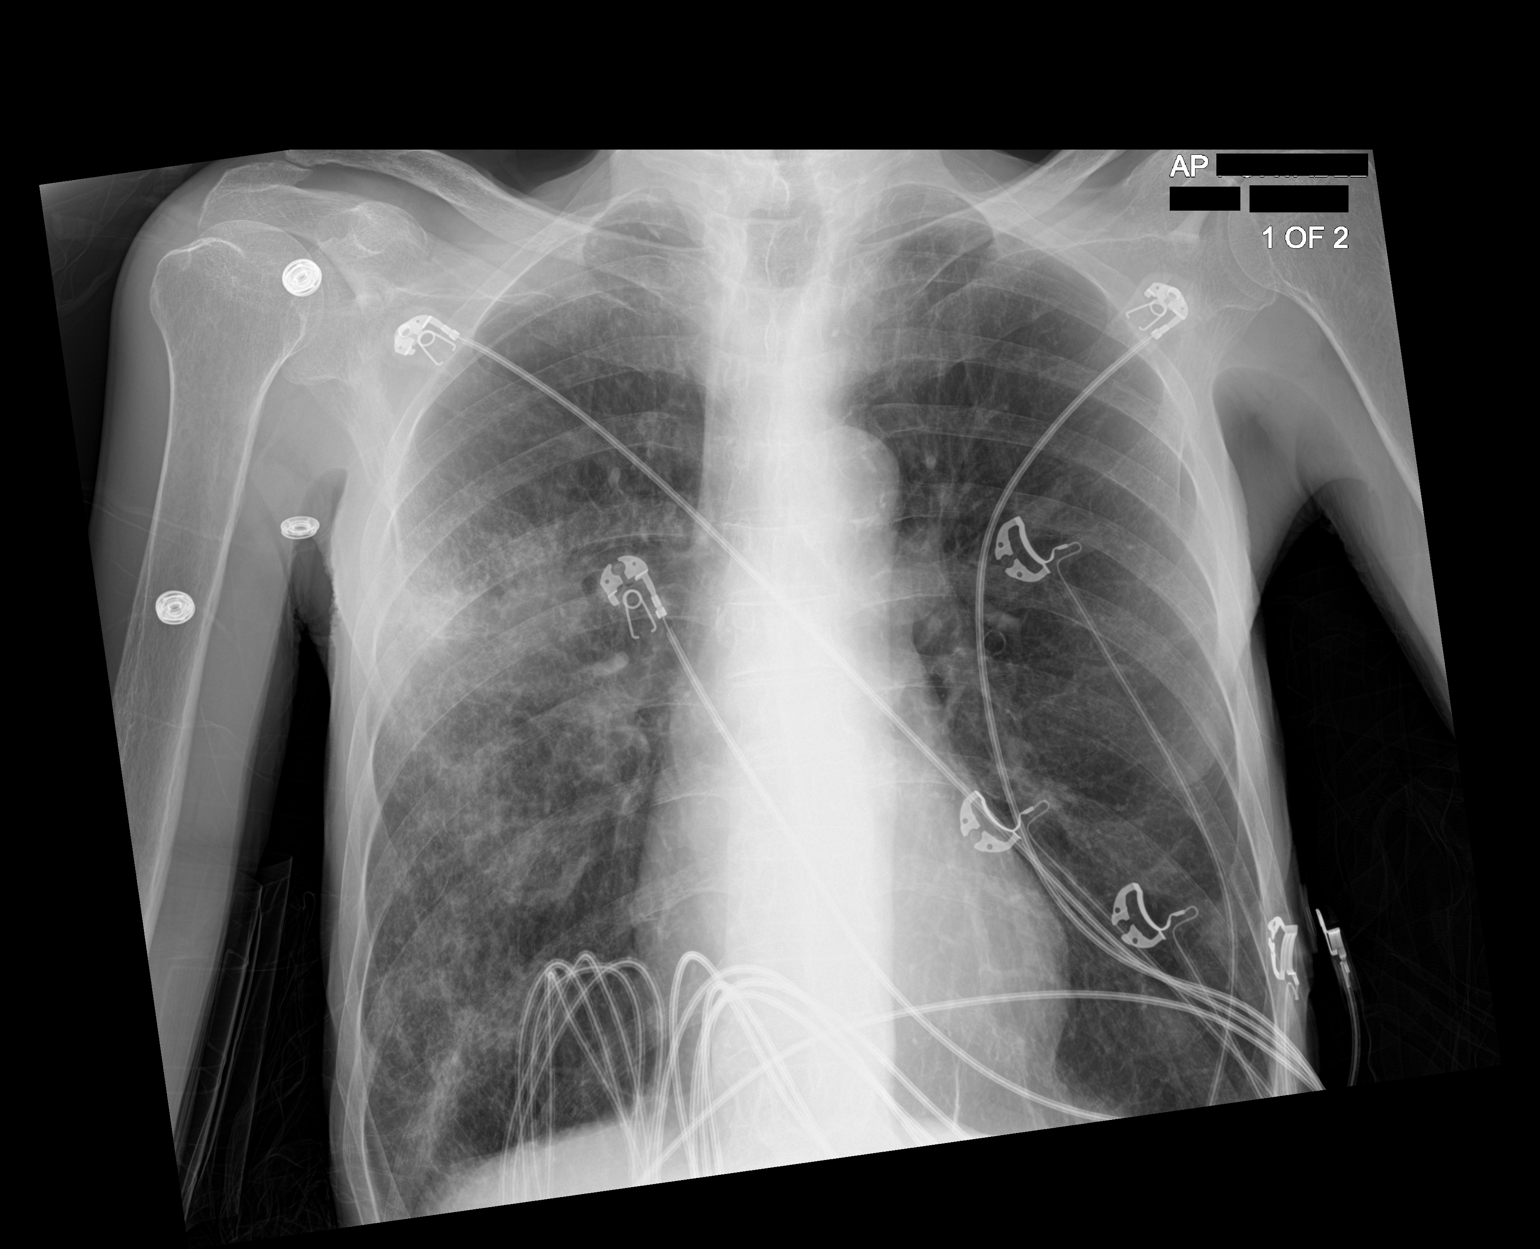

[chest ap (2 of 2)]
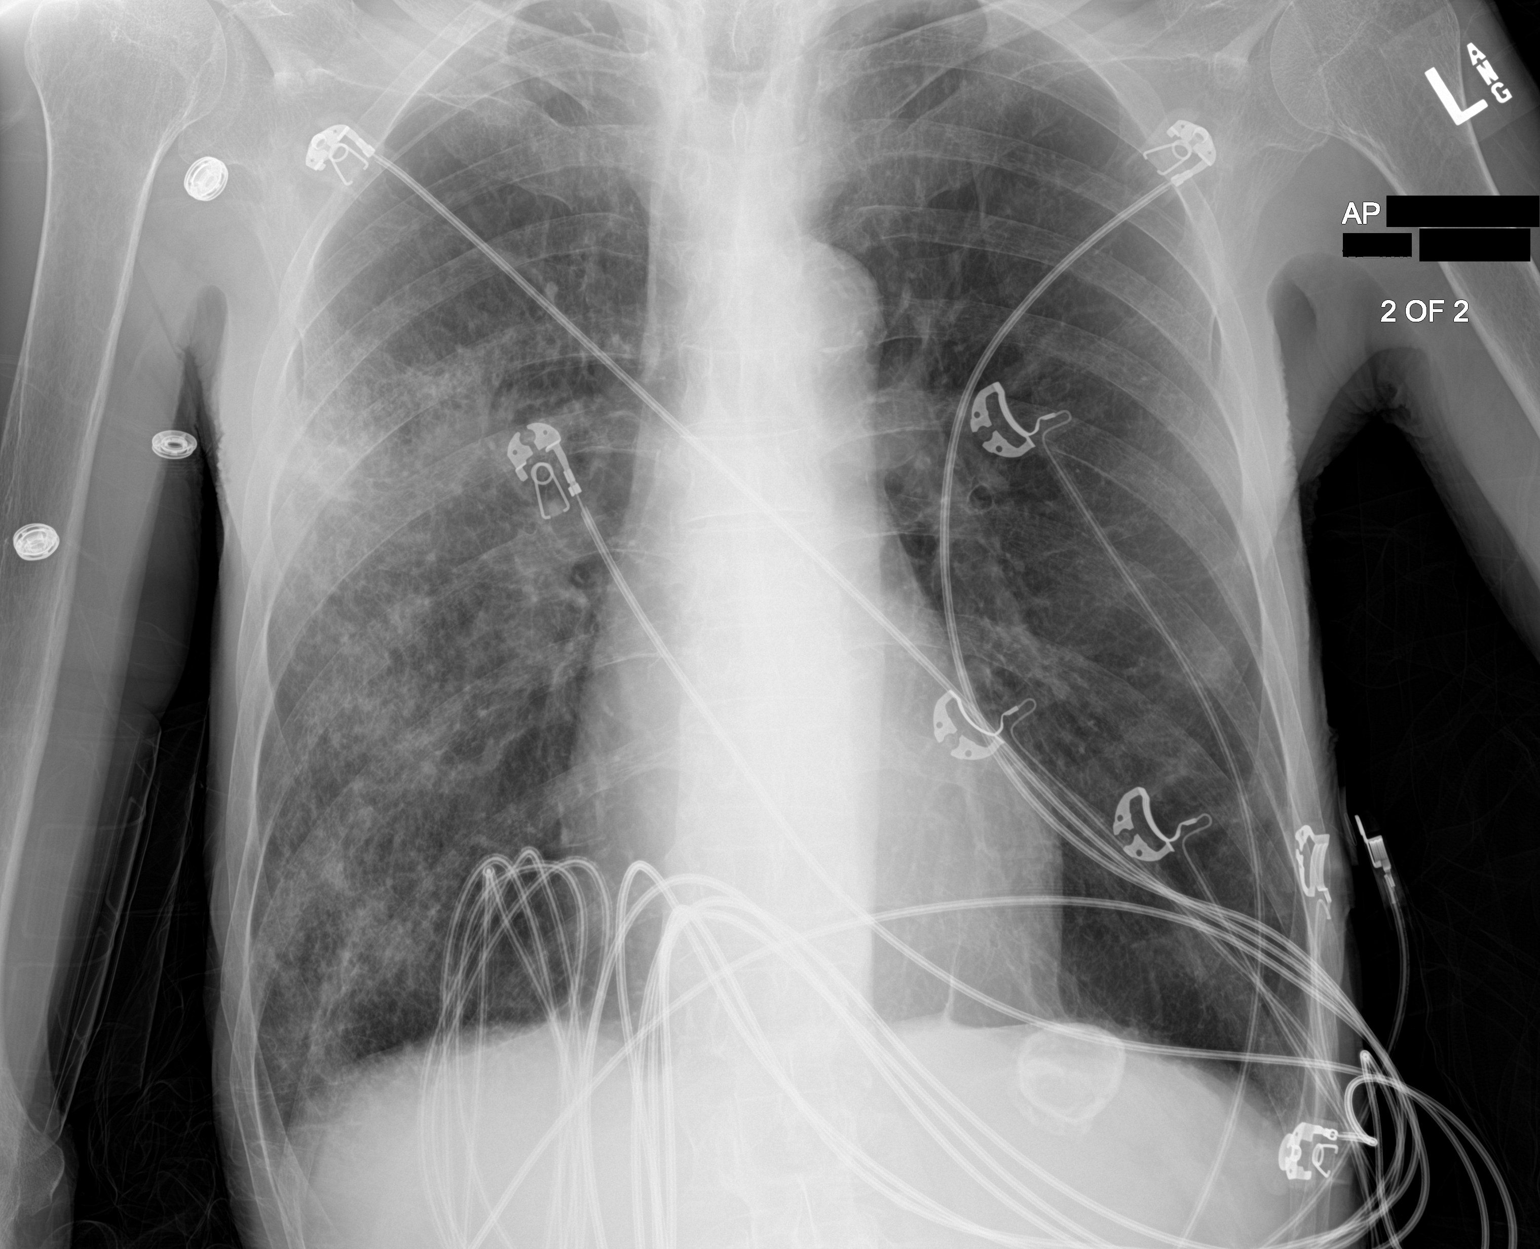

[2 of 2 positions shown; findings below may reference images not displayed]

FINDINGS: Lungs are hyperexpanded. Interstitial markings are diffusely
coarsened with chronic features. Focal airspace disease noted right
mid lung with patchy airspace opacity in the right lower lung. No
substantial pleural effusion The cardiopericardial silhouette is
within normal limits for size. Bones are diffusely demineralized.
Telemetry leads overlie the chest.
IMPRESSION: 1. Right mid and lower lung airspace disease, compatible with
pneumonia. Metastatic disease not excluded.
2. Emphysema with chronic interstitial coarsening.

## 2021-07-21 DEATH — deceased
